# Patient Record
Sex: Female | Born: 1955
Health system: Southern US, Community
[De-identification: ages and names within clinical notes are randomized; demographics above are authoritative.]

## PROBLEM LIST (undated history)

## (undated) DIAGNOSIS — D649 Anemia, unspecified: Secondary | ICD-10-CM

## (undated) DIAGNOSIS — E119 Type 2 diabetes mellitus without complications: Secondary | ICD-10-CM

## (undated) DIAGNOSIS — E669 Obesity, unspecified: Secondary | ICD-10-CM

## (undated) DIAGNOSIS — Z5189 Encounter for other specified aftercare: Secondary | ICD-10-CM

## (undated) DIAGNOSIS — H269 Unspecified cataract: Secondary | ICD-10-CM

## (undated) DIAGNOSIS — K580 Irritable bowel syndrome with diarrhea: Secondary | ICD-10-CM

## (undated) DIAGNOSIS — M1990 Unspecified osteoarthritis, unspecified site: Secondary | ICD-10-CM

## (undated) DIAGNOSIS — I10 Essential (primary) hypertension: Secondary | ICD-10-CM

## (undated) DIAGNOSIS — T7840XA Allergy, unspecified, initial encounter: Secondary | ICD-10-CM

## (undated) DIAGNOSIS — E039 Hypothyroidism, unspecified: Secondary | ICD-10-CM

## (undated) DIAGNOSIS — G510 Bell's palsy: Secondary | ICD-10-CM

## (undated) DIAGNOSIS — E785 Hyperlipidemia, unspecified: Secondary | ICD-10-CM

## (undated) HISTORY — DX: Allergy, unspecified, initial encounter: T78.40XA

## (undated) HISTORY — DX: Essential (primary) hypertension: I10

## (undated) HISTORY — DX: Encounter for other specified aftercare: Z51.89

## (undated) HISTORY — DX: Type 2 diabetes mellitus without complications: E11.9

## (undated) HISTORY — DX: Obesity, unspecified: E66.9

## (undated) HISTORY — DX: Anemia, unspecified: D64.9

## (undated) HISTORY — DX: Hyperlipidemia, unspecified: E78.5

## (undated) HISTORY — DX: Hypothyroidism, unspecified: E03.9

## (undated) HISTORY — PX: FRACTURE SURGERY: SHX138

## (undated) HISTORY — PX: KNEE ARTHROSCOPY: SUR90

## (undated) HISTORY — DX: Irritable bowel syndrome with diarrhea: K58.0

## (undated) HISTORY — DX: Unspecified cataract: H26.9

## (undated) HISTORY — PX: EYE SURGERY: SHX253

---

## 2005-08-30 ENCOUNTER — Ambulatory Visit (HOSPITAL_COMMUNITY): Payer: Self-pay | Admitting: Pulmonary Disease

## 2005-08-30 ENCOUNTER — Encounter (HOSPITAL_COMMUNITY): Admission: RE | Admit: 2005-08-30 | Discharge: 2005-09-03 | Payer: Self-pay | Admitting: Pulmonary Disease

## 2009-06-18 ENCOUNTER — Ambulatory Visit (HOSPITAL_COMMUNITY): Admission: RE | Admit: 2009-06-18 | Discharge: 2009-06-18 | Payer: Self-pay | Admitting: Pulmonary Disease

## 2010-11-22 ENCOUNTER — Ambulatory Visit (HOSPITAL_COMMUNITY)
Admission: RE | Admit: 2010-11-22 | Discharge: 2010-11-22 | Payer: Self-pay | Source: Home / Self Care | Attending: Pulmonary Disease | Admitting: Pulmonary Disease

## 2012-01-20 ENCOUNTER — Ambulatory Visit (HOSPITAL_COMMUNITY)
Admission: RE | Admit: 2012-01-20 | Discharge: 2012-01-20 | Disposition: A | Discharge status: Home / Self Care | Source: Ambulatory Visit | Attending: Pulmonary Disease | Admitting: Pulmonary Disease

## 2012-01-20 ENCOUNTER — Other Ambulatory Visit (HOSPITAL_COMMUNITY): Payer: Self-pay | Admitting: Pulmonary Disease

## 2012-01-20 DIAGNOSIS — M79606 Pain in leg, unspecified: Secondary | ICD-10-CM

## 2012-01-20 DIAGNOSIS — M79609 Pain in unspecified limb: Secondary | ICD-10-CM | POA: Insufficient documentation

## 2012-01-20 DIAGNOSIS — M7989 Other specified soft tissue disorders: Secondary | ICD-10-CM | POA: Insufficient documentation

## 2012-01-20 DIAGNOSIS — M25569 Pain in unspecified knee: Secondary | ICD-10-CM

## 2012-03-25 ENCOUNTER — Encounter (HOSPITAL_COMMUNITY): Payer: Self-pay

## 2012-03-25 ENCOUNTER — Emergency Department (HOSPITAL_COMMUNITY)

## 2012-03-25 ENCOUNTER — Emergency Department (HOSPITAL_COMMUNITY)
Admission: EM | Admit: 2012-03-25 | Discharge: 2012-03-25 | Disposition: A | Discharge status: Home / Self Care | Attending: Emergency Medicine | Admitting: Emergency Medicine

## 2012-03-25 DIAGNOSIS — R262 Difficulty in walking, not elsewhere classified: Secondary | ICD-10-CM | POA: Insufficient documentation

## 2012-03-25 DIAGNOSIS — M25469 Effusion, unspecified knee: Secondary | ICD-10-CM | POA: Insufficient documentation

## 2012-03-25 DIAGNOSIS — X58XXXA Exposure to other specified factors, initial encounter: Secondary | ICD-10-CM | POA: Insufficient documentation

## 2012-03-25 DIAGNOSIS — M25569 Pain in unspecified knee: Secondary | ICD-10-CM | POA: Insufficient documentation

## 2012-03-25 DIAGNOSIS — S8990XA Unspecified injury of unspecified lower leg, initial encounter: Secondary | ICD-10-CM | POA: Insufficient documentation

## 2012-03-25 HISTORY — DX: Anemia, unspecified: D64.9

## 2012-03-25 HISTORY — DX: Bell's palsy: G51.0

## 2012-03-25 MED ORDER — OXYCODONE-ACETAMINOPHEN 5-325 MG PO TABS
1.0000 | ORAL_TABLET | Freq: Once | ORAL | Status: AC
  Administered 2012-03-25: 1 via ORAL
  Filled 2012-03-25: qty 1

## 2012-03-25 MED ORDER — OXYCODONE-ACETAMINOPHEN 5-325 MG PO TABS: 1.0000 | ORAL_TABLET | ORAL | Status: AC | PRN

## 2012-03-25 NOTE — ED Provider Notes (Signed)
History     CSN: [id number]  Arrival date & time 03/25/12  1210   First MD Initiated Contact with Patient 03/25/12 1419      Chief Complaint  Patient presents with  . Knee Pain    HPI Barbara Bennett is a 56 y.o. female who presents to the ED for left knee pain. She states that the pain started about a month ago. She has been followed by Dr. Juanetta Gosling and has had x-ray and ultrasound. He gave her a diagnosis of arthritis and she is taking NSAID's. Today she was singing at church and moving around a lot and felt a pop in her left knee. The pain was severe and she was unable to bear weight after it happened. Her family brought her to the ED. The history was provided by the patient.  Past Medical History  Diagnosis Date  . Anemia   . Bell's palsy     History reviewed. No pertinent past surgical history.  No family history on file.  History  Substance Use Topics  . Smoking status: Never Smoker   . Smokeless tobacco: Not on file  . Alcohol Use: No    OB History    Grav Para Term Preterm Abortions TAB SAB Ect Mult Living                  Review of Systems  Constitutional: Negative for fever and chills.  HENT: Negative for neck pain.   Eyes: Negative.   Cardiovascular: Negative for palpitations.  Gastrointestinal: Negative for nausea, vomiting and abdominal pain.  Musculoskeletal: Negative for back pain.       Left knee pain  Skin: Negative for rash and wound.  Neurological: Negative for syncope and headaches.  Psychiatric/Behavioral: Negative for confusion and agitation.    Allergies  Sulfa antibiotics  Home Medications  No current outpatient prescriptions on file.  BP 139/79  Pulse 89  Temp(Src) 97.9 F (36.6 C) (Oral)  Resp 20  Ht 5' (1.524 m)  Wt 170 lb (77.111 kg)  BMI 33.20 kg/m2  SpO2 100%  LMP 02/22/2012  Physical Exam  Nursing note and vitals reviewed. Constitutional: She is oriented to person, place, and time. She appears well-developed and  well-nourished. No distress.  HENT:  Head: Normocephalic.  Neck: Neck supple.  Cardiovascular: Normal rate.   Pulmonary/Chest: Effort normal.  Musculoskeletal: She exhibits tenderness.       Left knee: She exhibits swelling. tenderness found.       Difficulty with ambulation due to pain. Right knee with minimal edema. Pain with passive range of motion.   Neurological: She is alert and oriented to person, place, and time.       Pedal pulses present and equal. Adequate circulation. Good touch sensation.  Skin: Skin is warm and dry.  Psychiatric: She has a normal mood and affect. Her behavior is normal. Judgment and thought content normal.    ED Course  Procedures  Labs Reviewed - No data to display Dg Knee Complete 4 Views Left  03/25/2012  *RADIOLOGY REPORT*  Clinical Data: Limited range of motion. Pivot injury at church today.  History of injury to the knee.  LEFT KNEE - COMPLETE 4+ VIEW  Comparison: 01/20/2012  Findings: There is no evidence for acute fracture or dislocation. No soft tissue foreign body or gas identified.  No joint effusion.  IMPRESSION: Negative exam.  Original Report Authenticated By: Patterson Hammersmith, M.D.    Assessment: Knee pain/injury  Plan:  Knee  immobilizer   Crutches   Percoct Rx   Follow up tomorrow as planned with Dr. Juanetta Gosling   Return here as needed  MDM  I have reviewed this patient's vital signs, nurses notes, appropriate labs and imaging.         [facility] Orlene Och, [place] 03/25/12 1513

## 2012-03-25 NOTE — ED Notes (Signed)
Felt left knee pop today, having knee pain for at least 1 month, hurts to bear weight on leg at this time

## 2012-03-25 NOTE — ED Notes (Signed)
Pt presents with left knee pain and swelling after "having fun in church and twisted wrong feeling a pop in the left knee". Pt is sitting in wheelchair talking to family. No acute distress noted at this time.

## 2012-03-26 ENCOUNTER — Other Ambulatory Visit (HOSPITAL_COMMUNITY): Payer: Self-pay | Admitting: Pulmonary Disease

## 2012-03-26 ENCOUNTER — Ambulatory Visit (HOSPITAL_COMMUNITY)
Admission: RE | Admit: 2012-03-26 | Discharge: 2012-03-26 | Disposition: A | Discharge status: Home / Self Care | Source: Ambulatory Visit | Attending: Pulmonary Disease | Admitting: Pulmonary Disease

## 2012-03-26 DIAGNOSIS — R609 Edema, unspecified: Secondary | ICD-10-CM

## 2012-03-26 DIAGNOSIS — IMO0002 Reserved for concepts with insufficient information to code with codable children: Secondary | ICD-10-CM | POA: Insufficient documentation

## 2012-03-26 DIAGNOSIS — M1710 Unilateral primary osteoarthritis, unspecified knee: Secondary | ICD-10-CM | POA: Insufficient documentation

## 2012-03-26 DIAGNOSIS — M23329 Other meniscus derangements, posterior horn of medial meniscus, unspecified knee: Secondary | ICD-10-CM | POA: Insufficient documentation

## 2012-03-26 DIAGNOSIS — M25569 Pain in unspecified knee: Secondary | ICD-10-CM | POA: Insufficient documentation

## 2012-03-26 DIAGNOSIS — M25469 Effusion, unspecified knee: Secondary | ICD-10-CM | POA: Insufficient documentation

## 2012-03-26 DIAGNOSIS — M25562 Pain in left knee: Secondary | ICD-10-CM

## 2012-03-26 NOTE — ED Provider Notes (Signed)
Medical screening examination/treatment/procedure(s) were performed by non-physician practitioner and as supervising physician I was immediately available for consultation/collaboration.  Donabelle Molden S. Saniyah Mondesir, MD 03/26/12 2021 

## 2012-04-02 ENCOUNTER — Other Ambulatory Visit: Payer: Self-pay | Admitting: Orthopedic Surgery

## 2012-04-06 ENCOUNTER — Ambulatory Visit (HOSPITAL_BASED_OUTPATIENT_CLINIC_OR_DEPARTMENT_OTHER)
Admission: RE | Admit: 2012-04-06 | Discharge: 2012-04-06 | Disposition: A | Discharge status: Home / Self Care | Source: Ambulatory Visit | Attending: Orthopedic Surgery | Admitting: Orthopedic Surgery

## 2012-04-06 ENCOUNTER — Encounter (HOSPITAL_BASED_OUTPATIENT_CLINIC_OR_DEPARTMENT_OTHER): Payer: Self-pay | Admitting: *Deleted

## 2012-04-06 ENCOUNTER — Ambulatory Visit (HOSPITAL_BASED_OUTPATIENT_CLINIC_OR_DEPARTMENT_OTHER): Admitting: Anesthesiology

## 2012-04-06 ENCOUNTER — Encounter (HOSPITAL_BASED_OUTPATIENT_CLINIC_OR_DEPARTMENT_OTHER): Payer: Self-pay | Admitting: Anesthesiology

## 2012-04-06 ENCOUNTER — Encounter (HOSPITAL_BASED_OUTPATIENT_CLINIC_OR_DEPARTMENT_OTHER)
Admission: RE | Disposition: A | Discharge status: Home / Self Care | Payer: Self-pay | Source: Ambulatory Visit | Attending: Orthopedic Surgery

## 2012-04-06 DIAGNOSIS — S83249A Other tear of medial meniscus, current injury, unspecified knee, initial encounter: Secondary | ICD-10-CM

## 2012-04-06 DIAGNOSIS — M23329 Other meniscus derangements, posterior horn of medial meniscus, unspecified knee: Secondary | ICD-10-CM | POA: Insufficient documentation

## 2012-04-06 SURGERY — ARTHROSCOPY, KNEE, WITH MEDIAL MENISCECTOMY
Anesthesia: General | Site: Knee | Laterality: Left | Wound class: Clean

## 2012-04-06 MED ORDER — LACTATED RINGERS IV SOLN
INTRAVENOUS | Status: DC
  Administered 2012-04-06 (×3): via INTRAVENOUS

## 2012-04-06 MED ORDER — BUPIVACAINE HCL (PF) 0.5 % IJ SOLN
INTRAMUSCULAR | Status: DC | PRN
  Administered 2012-04-06: 20 mL

## 2012-04-06 MED ORDER — DEXAMETHASONE SODIUM PHOSPHATE 4 MG/ML IJ SOLN
INTRAMUSCULAR | Status: DC | PRN
  Administered 2012-04-06: 10 mg via INTRAVENOUS

## 2012-04-06 MED ORDER — OXYCODONE-ACETAMINOPHEN 5-325 MG PO TABS
1.0000 | ORAL_TABLET | ORAL | Status: DC | PRN
  Administered 2012-04-06: 1 via ORAL

## 2012-04-06 MED ORDER — PROMETHAZINE HCL 25 MG PO TABS: 25.0000 mg | ORAL_TABLET | Freq: Four times a day (QID) | ORAL | Status: DC | PRN

## 2012-04-06 MED ORDER — OXYCODONE-ACETAMINOPHEN 10-325 MG PO TABS: 1.0000 | ORAL_TABLET | Freq: Four times a day (QID) | ORAL | Status: AC | PRN

## 2012-04-06 MED ORDER — METHOCARBAMOL 500 MG PO TABS: 500.0000 mg | ORAL_TABLET | Freq: Four times a day (QID) | ORAL | Status: AC

## 2012-04-06 MED ORDER — FENTANYL CITRATE 0.05 MG/ML IJ SOLN
INTRAMUSCULAR | Status: DC | PRN
  Administered 2012-04-06: 100 ug via INTRAVENOUS
  Administered 2012-04-06 (×2): 50 ug via INTRAVENOUS

## 2012-04-06 MED ORDER — MIDAZOLAM HCL 5 MG/5ML IJ SOLN
INTRAMUSCULAR | Status: DC | PRN
  Administered 2012-04-06: 1 mg via INTRAVENOUS

## 2012-04-06 MED ORDER — PROPOFOL 10 MG/ML IV EMUL
INTRAVENOUS | Status: DC | PRN
  Administered 2012-04-06: 200 mg via INTRAVENOUS

## 2012-04-06 MED ORDER — HYDROMORPHONE HCL PF 1 MG/ML IJ SOLN
0.2500 mg | INTRAMUSCULAR | Status: DC | PRN
  Administered 2012-04-06 (×2): 0.25 mg via INTRAVENOUS

## 2012-04-06 MED ORDER — SODIUM CHLORIDE 0.9 % IR SOLN
Status: DC | PRN
  Administered 2012-04-06: 9000 mL

## 2012-04-06 MED ORDER — CEFAZOLIN SODIUM 1-5 GM-% IV SOLN
1.0000 g | INTRAVENOUS | Status: AC
  Administered 2012-04-06: 1 g via INTRAVENOUS

## 2012-04-06 SURGICAL SUPPLY — 47 items
BANDAGE ELASTIC 6 VELCRO ST LF (GAUZE/BANDAGES/DRESSINGS) ×2 IMPLANT
BANDAGE ESMARK 6X9 LF (GAUZE/BANDAGES/DRESSINGS) IMPLANT
BENZOIN TINCTURE PRP APPL 2/3 (GAUZE/BANDAGES/DRESSINGS) ×2 IMPLANT
BLADE CUDA 5.5 (BLADE) IMPLANT
BLADE CUDA GRT WHITE 3.5 (BLADE) IMPLANT
BLADE CUDA SHAVER 3.5 (BLADE) IMPLANT
BLADE CUTTER GATOR 3.5 (BLADE) ×2 IMPLANT
BLADE CUTTER MENIS 5.5 (BLADE) IMPLANT
BLADE GREAT WHITE 4.2 (BLADE) IMPLANT
BNDG ESMARK 6X9 LF (GAUZE/BANDAGES/DRESSINGS)
CANISTER OMNI JUG 16 LITER (MISCELLANEOUS) ×2 IMPLANT
CANISTER SUCTION 2500CC (MISCELLANEOUS) IMPLANT
CLOTH BEACON ORANGE TIMEOUT ST (SAFETY) ×2 IMPLANT
CUFF TOURNIQUET SINGLE 34IN LL (TOURNIQUET CUFF) IMPLANT
CUTTER KNOT PUSHER 2-0 FIBERWI (INSTRUMENTS) IMPLANT
CUTTER MENISCUS  4.2MM (BLADE)
CUTTER MENISCUS 4.2MM (BLADE) IMPLANT
DRAPE ARTHROSCOPY W/POUCH 90 (DRAPES) ×2 IMPLANT
DURAPREP 26ML APPLICATOR (WOUND CARE) ×2 IMPLANT
ELECT MENISCUS 165MM 90D (ELECTRODE) IMPLANT
ELECT REM PT RETURN 9FT ADLT (ELECTROSURGICAL)
ELECTRODE REM PT RTRN 9FT ADLT (ELECTROSURGICAL) IMPLANT
GLOVE BIO SURGEON STRL SZ8 (GLOVE) ×2 IMPLANT
GLOVE BIOGEL M STRL SZ7.5 (GLOVE) ×2 IMPLANT
GLOVE BIOGEL PI IND STRL 8 (GLOVE) ×3 IMPLANT
GLOVE BIOGEL PI INDICATOR 8 (GLOVE) ×3
GLOVE ORTHO TXT STRL SZ7.5 (GLOVE) ×2 IMPLANT
GOWN PREVENTION PLUS XLARGE (GOWN DISPOSABLE) ×2 IMPLANT
GOWN PREVENTION PLUS XXLARGE (GOWN DISPOSABLE) ×4 IMPLANT
HOLDER KNEE FOAM BLUE (MISCELLANEOUS) ×2 IMPLANT
KNEE WRAP E Z 3 GEL PACK (MISCELLANEOUS) ×2 IMPLANT
NEEDLE MENISCAL REPAIR DBL ARM (NEEDLE) IMPLANT
NEEDLE MENISCAL REPAIR W/EYELT (NEEDLE) IMPLANT
PACK ARTHROSCOPY DSU (CUSTOM PROCEDURE TRAY) ×2 IMPLANT
PACK BASIN DAY SURGERY FS (CUSTOM PROCEDURE TRAY) ×2 IMPLANT
PENCIL BUTTON HOLSTER BLD 10FT (ELECTRODE) IMPLANT
SET ARTHROSCOPY TUBING (MISCELLANEOUS)
SET ARTHROSCOPY TUBING LN (MISCELLANEOUS) IMPLANT
SET IRRIG Y TYPE TUR BLADDER L (SET/KITS/TRAYS/PACK) ×2 IMPLANT
SLEEVE SCD COMPRESS KNEE MED (MISCELLANEOUS) ×2 IMPLANT
SPONGE GAUZE 4X4 12PLY (GAUZE/BANDAGES/DRESSINGS) ×2 IMPLANT
STRIP CLOSURE SKIN 1/2X4 (GAUZE/BANDAGES/DRESSINGS) ×2 IMPLANT
SUT MNCRL AB 4-0 PS2 18 (SUTURE) ×2 IMPLANT
TOWEL OR 17X24 6PK STRL BLUE (TOWEL DISPOSABLE) ×2 IMPLANT
TOWEL OR NON WOVEN STRL DISP B (DISPOSABLE) ×2 IMPLANT
WAND STAR VAC 90 (SURGICAL WAND) ×2 IMPLANT
WATER STERILE IRR 1000ML POUR (IV SOLUTION) ×2 IMPLANT

## 2012-04-06 NOTE — Op Note (Signed)
04/06/2012  6:27 PM  PATIENT:  Barbara Bennett    PRE-OPERATIVE DIAGNOSIS:  Left knee medial meniscus tear  POST-OPERATIVE DIAGNOSIS:  Same, with grade 4 chondral defect on the medial tibial condyle  PROCEDURE:  KNEE ARTHROSCOPY WITH MEDIAL MENISECTOMY  SURGEON:  Eulas Post, MD  PHYSICIAN ASSISTANT: Janace Litten, OPA-C, present and scrubbed throughout the case, critical for completion in a timely fashion, and for retraction, instrumentation, and closure.  ANESTHESIA:   General  PREOPERATIVE INDICATIONS:  Barbara Bennett is a  56 y.o. female with a diagnosis of Left knee medial meniscus tear who failed conservative measures and elected for surgical management.    The risks benefits and alternatives were discussed with the patient preoperatively including but not limited to the risks of infection, bleeding, nerve injury, cardiopulmonary complications, the need for revision surgery, among others, and the patient was willing to proceed. We also discussed the risks of incomplete relief of symptoms, and the need for potential arthroplasty.  OPERATIVE IMPLANTS: None  OPERATIVE FINDINGS: The patellofemoral joint had a little bit of chondromalacia, but was fairly well-maintained. The lateral compartment was normal. The anterior cruciate ligament and PCL were intact. The medial and lateral gutters were normal. The medial compartment had a radial tear of the posterior horn of the medial meniscus. There was also a grade 4 0.75 x 0.75 cm chondral lesion, on the tibial plateau. There was diffuse grade 2 changes on the femur.  OPERATIVE PROCEDURE: The patient was brought to the operating room and placed in the supine position. Time out was performed. Diagnostic arthroscopy was carried out after a sterile prep and drape. The above-named findings were noted. The arthroscopic shaver and the arthroscopic liner was used to debride the medial meniscus back to a stable rim. The chondral fragments off of  the tibial condyle were also debrided with the shaver. I saucerize the posterior horn of the medial meniscus, back to a stable configuration. The diagnostic arthroscopy was completed, and the knee was irrigated copiously, and the portals closed with Monocryl followed by Steri-Strips dural gauze. She tolerated the procedure well and there were no complications.

## 2012-04-06 NOTE — Transfer of Care (Signed)
Immediate Anesthesia Transfer of Care Note  Patient: Barbara Bennett  Procedure(s) Performed: Procedure(s) (LRB): KNEE ARTHROSCOPY WITH MEDIAL MENISECTOMY (Left)  Patient Location: PACU  Anesthesia Type: General  Level of Consciousness: sedated  Airway & Oxygen Therapy: Patient Spontanous Breathing and Patient connected to face mask oxygen  Post-op Assessment: Report given to PACU RN and Post -op Vital signs reviewed and stable  Post vital signs: Reviewed and stable  Complications: No apparent anesthesia complications

## 2012-04-06 NOTE — H&P (Signed)
  PREOPERATIVE H&P  Chief Complaint: left knee medial meniscus tear  HPI: Barbara Bennett is a 56 y.o. female who presents for preoperative history and physical with a diagnosis of left knee medial meniscus tear. Symptoms are rated as moderate to severe, and have been worsening.  This is significantly impairing activities of daily living.  She has elected for surgical management.   Past Medical History  Diagnosis Date  . Anemia   . Bell's palsy    No past surgical history on file. History   Social History  . Marital Status: Married    Spouse Name: N/A    Number of Children: N/A  . Years of Education: N/A   Social History Main Topics  . Smoking status: Never Smoker   . Smokeless tobacco: Not on file  . Alcohol Use: No  . Drug Use: No  . Sexually Active: No   Other Topics Concern  . Not on file   Social History Narrative  . No narrative on file   No family history on file. Allergies  Allergen Reactions  . Sulfa Antibiotics Rash   Prior to Admission medications   Medication Sig Start Date End Date Taking? Authorizing Provider  norethindrone-ethinyl estradiol (MICROGESTIN,JUNEL,LOESTRIN) 1-20 MG-MCG tablet Take 1 tablet by mouth daily.   Yes Historical Provider, MD     Positive ROS: All other systems have been reviewed and were otherwise negative with the exception of those mentioned in the HPI and as above.  Physical Exam: General: Alert, no acute distress Cardiovascular: No pedal edema Respiratory: No cyanosis, no use of accessory musculature GI: No organomegaly, abdomen is soft and non-tender Skin: No lesions in the area of chief complaint Neurologic: Sensation intact distally Psychiatric: Patient is competent for consent with normal mood and affect Lymphatic: No axillary or cervical lymphadenopathy  MUSCULOSKELETAL: Left knee with positive medial joint line tenderness. Mild effusion. Normal stability.  Assessment: left knee mmt  Plan: Plan for  Procedure(s): KNEE ARTHROSCOPY WITH MEDIAL MENISECTOMY  The risks benefits and alternatives were discussed with the patient including but not limited to the risks of nonoperative treatment, versus surgical intervention including infection, bleeding, nerve injury,  blood clots, cardiopulmonary complications, morbidity, mortality, among others, and they were willing to proceed.   Eulas Post, MD 04/06/2012 7:26 AM

## 2012-04-06 NOTE — Discharge Instructions (Signed)
Arthroscopic Procedure, Knee An arthroscopic procedure can find what is wrong with your knee. PROCEDURE Arthroscopy is a surgical technique that allows your orthopedic surgeon to diagnose and treat your knee injury with accuracy. They will look into your knee through a small instrument. This is almost like a small (pencil sized) telescope. Because arthroscopy affects your knee less than open knee surgery, you can anticipate a more rapid recovery. Taking an active role by following your caregiver's instructions will help with rapid and complete recovery. Use crutches, rest, elevation, ice, and knee exercises as instructed. The length of recovery depends on various factors including type of injury, age, physical condition, medical conditions, and your rehabilitation. Your knee is the joint between the large bones (femur and tibia) in your leg. Cartilage covers these bone ends which are smooth and slippery and allow your knee to bend and move smoothly. Two menisci, thick, semi-lunar shaped pads of cartilage which form a rim inside the joint, help absorb shock and stabilize your knee. Ligaments bind the bones together and support your knee joint. Muscles move the joint, help support your knee, and take stress off the joint itself. Because of this all programs and physical therapy to rehabilitate an injured or repaired knee require rebuilding and strengthening your muscles. AFTER THE PROCEDURE  After the procedure, you will be moved to a recovery area until most of the effects of the medication have worn off. Your caregiver will discuss the test results with you.   Only take over-the-counter or prescription medicines for pain, discomfort, or fever as directed by your caregiver.  SEEK MEDICAL CARE IF:   You have increased bleeding from your wounds.   You see redness, swelling, or have increasing pain in your wounds.   You have pus coming from your wound.   You have an oral temperature above 102 F (38.9  C).   You notice a bad smell coming from the wound or dressing.   You have severe pain with any motion of your knee.  SEEK IMMEDIATE MEDICAL CARE IF:   You develop a rash.   You have difficulty breathing.   You have any allergic problems.  Document Released: 11/18/2000 Document Revised: 11/10/2011 Document Reviewed: 06/11/2008 ExitCare Patient Information 2012 [place], [facility] Post Anesthesia Home Care Instructions  Activity: Get plenty of rest for the remainder of the day. A responsible adult should stay with you for 24 hours following the procedure.  For the next 24 hours, DO NOT: -Drive a car -Advertising copywriter -Drink alcoholic beverages -Take any medication unless instructed by your physician -Make any legal decisions or sign important papers.  Meals: Start with liquid foods such as gelatin or soup. Progress to regular foods as tolerated. Avoid greasy, spicy, heavy foods. If nausea and/or vomiting occur, drink only clear liquids until the nausea and/or vomiting subsides. Call your physician if vomiting continues.  Special Instructions/Symptoms: Your throat may feel dry or sore from the anesthesia or the breathing tube placed in your throat during surgery. If this causes discomfort, gargle with warm salt water. The discomfort should disappear within 24 hours.  [name]

## 2012-04-06 NOTE — Anesthesia Postprocedure Evaluation (Signed)
  Anesthesia Post-op Note  Patient: Barbara Bennett  Procedure(s) Performed: Procedure(s) (LRB): KNEE ARTHROSCOPY WITH MEDIAL MENISECTOMY (Left)  Patient Location: PACU  Anesthesia Type: General  Level of Consciousness: awake, alert  and oriented  Airway and Oxygen Therapy: Patient Spontanous Breathing  Post-op Pain: mild  Post-op Assessment: Post-op Vital signs reviewed, Patient's Cardiovascular Status Stable, Respiratory Function Stable, Patent Airway, No signs of Nausea or vomiting and Pain level controlled  Post-op Vital Signs: Reviewed and stable  Complications: No apparent anesthesia complications

## 2012-04-06 NOTE — Anesthesia Preprocedure Evaluation (Signed)
Anesthesia Evaluation  Patient identified by MRN, date of birth, ID band Patient awake    Reviewed: Allergy & Precautions, H&P , NPO status , Patient's Chart, lab work & pertinent test results  History of Anesthesia Complications Negative for: history of anesthetic complications  Airway Mallampati: I TM Distance: >3 FB Neck ROM: Full    Dental  (+) Missing and Dental Advisory Given   Pulmonary neg pulmonary ROS,  breath sounds clear to auscultation  Pulmonary exam normal       Cardiovascular negative cardio ROS  Rhythm:Regular Rate:Normal     Neuro/Psych negative neurological ROS     GI/Hepatic negative GI ROS, Neg liver ROS,   Endo/Other  Morbid obesity  Renal/GU negative Renal ROS     Musculoskeletal   Abdominal (+) + obese,   Peds  Hematology Sickle cell trait, has required transfusion in past   Anesthesia Other Findings   Reproductive/Obstetrics                           Anesthesia Physical Anesthesia Plan  ASA: II  Anesthesia Plan: General   Post-op Pain Management:    Induction: Intravenous  Airway Management Planned: LMA  Additional Equipment:   Intra-op Plan:   Post-operative Plan:   Informed Consent: I have reviewed the patients History and Physical, chart, labs and discussed the procedure including the risks, benefits and alternatives for the proposed anesthesia with the patient or authorized representative who has indicated his/her understanding and acceptance.   Dental advisory given  Plan Discussed with: CRNA and Surgeon  Anesthesia Plan Comments: (Plan routine monitors, GA- LMA ok)        Anesthesia Quick Evaluation

## 2013-04-15 ENCOUNTER — Other Ambulatory Visit (HOSPITAL_COMMUNITY): Payer: Self-pay | Admitting: Pulmonary Disease

## 2013-04-15 ENCOUNTER — Ambulatory Visit (HOSPITAL_COMMUNITY)
Admission: RE | Admit: 2013-04-15 | Discharge: 2013-04-15 | Disposition: A | Discharge status: Home / Self Care | Source: Ambulatory Visit | Attending: Pulmonary Disease | Admitting: Pulmonary Disease

## 2013-04-15 DIAGNOSIS — M7989 Other specified soft tissue disorders: Secondary | ICD-10-CM | POA: Insufficient documentation

## 2013-04-15 DIAGNOSIS — R609 Edema, unspecified: Secondary | ICD-10-CM

## 2013-09-02 ENCOUNTER — Ambulatory Visit (HOSPITAL_COMMUNITY)
Admission: RE | Admit: 2013-09-02 | Discharge: 2013-09-02 | Disposition: A | Discharge status: Home / Self Care | Source: Ambulatory Visit | Attending: Pulmonary Disease | Admitting: Pulmonary Disease

## 2013-09-02 ENCOUNTER — Other Ambulatory Visit (HOSPITAL_COMMUNITY): Payer: Self-pay | Admitting: Pulmonary Disease

## 2013-09-02 DIAGNOSIS — M25562 Pain in left knee: Secondary | ICD-10-CM

## 2013-09-02 DIAGNOSIS — M25569 Pain in unspecified knee: Secondary | ICD-10-CM | POA: Insufficient documentation

## 2016-03-24 NOTE — Patient Instructions (Signed)
Barbara GoldsBeverly M Yankee  03/24/2016     @PREFPERIOPPHARMACY @   Your procedure is scheduled on 03/31/16  [facility]  Call this number if you have problems the morning of surgery:  [phone]   Remember:  Do not eat food or drink liquids after midnight.  Take these medicines the morning of surgery with A SIP OF WATER none   Do not wear jewelry, make-up or nail polish.  Do not wear lotions, powders, or perfumes.  You may wear deodorant.  Do not shave 48 hours prior to surgery.  Men may shave face and neck.  Do not bring valuables to the hospital.  [facility] any belongings or valuables.  Contacts, dentures or bridgework may not be worn into surgery.  Leave your suitcase in the car.  After surgery it may be brought to your room.  For patients admitted to the hospital, discharge time will be determined by your treatment team.  Patients discharged the day of surgery will not be allowed to drive home.   Name and phone number of your driver:   family Special instructions:  none  Please read over the following fact sheets that you were given. Anesthesia Post-op Instructions and Care and Recovery After Surgery      PATIENT INSTRUCTIONS POST-ANESTHESIA  IMMEDIATELY FOLLOWING SURGERY:  Do not drive or operate machinery for the first twenty four hours after surgery.  Do not make any important decisions for twenty four hours after surgery or while taking narcotic pain medications or sedatives.  If you develop intractable nausea and vomiting or a severe headache please notify your doctor immediately.  FOLLOW-UP:  Please make an appointment with your surgeon as instructed. You do not need to follow up with anesthesia unless specifically instructed to do so.  WOUND CARE INSTRUCTIONS (if applicable):  Keep a dry clean dressing on the anesthesia/puncture wound site if there is drainage.  Once the wound has quit draining you may leave it open to air.   Generally you should leave the bandage intact for twenty four hours unless there is drainage.  If the epidural site drains for more than 36-48 hours please call the anesthesia department.  QUESTIONS?:  Please feel free to call your physician or the hospital operator if you have any questions, and they will be happy to assist you.      Cataract A cataract is a clouding of the lens of the eye. When a lens becomes cloudy, vision is reduced based on the degree and nature of the clouding. Many cataracts reduce vision to some degree. Some cataracts make people more near-sighted as they develop. Other cataracts increase glare. Cataracts that are ignored and become worse can sometimes look white. The white color can be seen through the pupil. CAUSES   Aging. However, cataracts may occur at any age, even in newborns.  Certain drugs.  Trauma to the eye.  Certain diseases such as diabetes.  Specific eye diseases such as chronic inflammation inside the eye or a sudden attack of a rare form of glaucoma.  Inherited or acquired medical problems. SYMPTOMS   Gradual, progressive drop in vision in the affected eye.  Severe, rapid visual loss. This most often happens when trauma is the cause. DIAGNOSIS  To detect a cataract, an eye doctor examines the lens. Cataracts are best diagnosed with an exam of the eyes with the pupils enlarged (dilated) by drops.  TREATMENT  For an early cataract,  vision may improve by using different eyeglasses or stronger lighting. If that does not help your vision, surgery is the only effective treatment. A cataract needs to be surgically removed when vision loss interferes with your everyday activities, such as driving, reading, or watching TV. A cataract may also have to be removed if it prevents examination or treatment of another eye problem. Surgery removes the cloudy lens and usually replaces it with a substitute lens (intraocular lens, IOL).  At a time when both you and  your doctor agree, the cataract will be surgically removed. If you have cataracts in both eyes, only one is usually removed at a time. This allows the operated eye to heal and be out of danger from any possible problems after surgery (such as infection or poor wound healing). In rare cases, a cataract may be doing damage to your eye. In these cases, your caregiver may advise surgical removal right away. The vast majority of people who have cataract surgery have better vision afterward. HOME CARE INSTRUCTIONS  If you are not planning surgery, you may be asked to do the following:  Use different eyeglasses.  Use stronger or brighter lighting.  Ask your eye doctor about reducing your medicine dose or changing medicines if it is thought that a medicine caused your cataract. Changing medicines does not make the cataract go away on its own.  Become familiar with your surroundings. Poor vision can lead to injury. Avoid bumping into things on the affected side. You are at a higher risk for tripping or falling.  Exercise extreme care when driving or operating machinery.  Wear sunglasses if you are sensitive to bright light or experiencing problems with glare. SEEK IMMEDIATE MEDICAL CARE IF:   You have a worsening or sudden vision loss.  You notice redness, swelling, or increasing pain in the eye.  You have a fever.   This information is not intended to replace advice given to you by your health care provider. Make sure you discuss any questions you have with your health care provider.   Document Released: 11/21/2005 Document Revised: 02/13/2012 Document Reviewed: 05/27/2015 Elsevier Interactive Patient Education [employer].

## 2016-03-25 ENCOUNTER — Encounter (HOSPITAL_COMMUNITY): Payer: Self-pay

## 2016-03-25 ENCOUNTER — Other Ambulatory Visit: Payer: Self-pay

## 2016-03-25 ENCOUNTER — Encounter (HOSPITAL_COMMUNITY)
Admission: RE | Admit: 2016-03-25 | Discharge: 2016-03-25 | Disposition: A | Discharge status: Home / Self Care | Source: Ambulatory Visit | Attending: Ophthalmology | Admitting: Ophthalmology

## 2016-03-25 DIAGNOSIS — Z01810 Encounter for preprocedural cardiovascular examination: Secondary | ICD-10-CM | POA: Diagnosis not present

## 2016-03-25 DIAGNOSIS — Z01812 Encounter for preprocedural laboratory examination: Secondary | ICD-10-CM | POA: Diagnosis present

## 2016-03-25 HISTORY — DX: Unspecified osteoarthritis, unspecified site: M19.90

## 2016-03-25 LAB — BASIC METABOLIC PANEL
Anion gap: 9 (ref 5–15)
BUN: 13 mg/dL (ref 6–20)
CALCIUM: 9.3 mg/dL (ref 8.9–10.3)
CO2: 25 mmol/L (ref 22–32)
CREATININE: 0.67 mg/dL (ref 0.44–1.00)
Chloride: 104 mmol/L (ref 101–111)
GFR calc Af Amer: >60 mL/min (ref >60)
Glucose, Bld: 182 mg/dL — ABNORMAL HIGH (ref 65–99)
POTASSIUM: 3.6 mmol/L (ref 3.5–5.1)
SODIUM: 138 mmol/L (ref 135–145)

## 2016-03-25 LAB — CBC
HCT: 36.6 % (ref 36.0–46.0)
Hemoglobin: 12.2 g/dL (ref 12.0–15.0)
MCH: 27.3 pg (ref 26.0–34.0)
MCHC: 33.3 g/dL (ref 30.0–36.0)
MCV: 81.9 fL (ref 78.0–100.0)
PLATELETS: 171 10*3/uL (ref 150–400)
RBC: 4.47 MIL/uL (ref 3.87–5.11)
RDW: 13.1 % (ref 11.5–15.5)
WBC: 6.2 10*3/uL (ref 4.0–10.5)

## 2016-03-31 ENCOUNTER — Encounter (HOSPITAL_COMMUNITY): Payer: Self-pay | Admitting: *Deleted

## 2016-03-31 ENCOUNTER — Ambulatory Visit (HOSPITAL_COMMUNITY)
Admission: RE | Admit: 2016-03-31 | Discharge: 2016-03-31 | Disposition: A | Discharge status: Home / Self Care | Source: Ambulatory Visit | Attending: Ophthalmology | Admitting: Ophthalmology

## 2016-03-31 ENCOUNTER — Encounter (HOSPITAL_COMMUNITY)
Admission: RE | Disposition: A | Discharge status: Home / Self Care | Payer: Self-pay | Source: Ambulatory Visit | Attending: Ophthalmology

## 2016-03-31 ENCOUNTER — Ambulatory Visit (HOSPITAL_COMMUNITY): Admitting: Anesthesiology

## 2016-03-31 DIAGNOSIS — H2512 Age-related nuclear cataract, left eye: Secondary | ICD-10-CM | POA: Diagnosis not present

## 2016-03-31 DIAGNOSIS — Z6837 Body mass index (BMI) 37.0-37.9, adult: Secondary | ICD-10-CM | POA: Insufficient documentation

## 2016-03-31 DIAGNOSIS — E119 Type 2 diabetes mellitus without complications: Secondary | ICD-10-CM | POA: Diagnosis not present

## 2016-03-31 DIAGNOSIS — D573 Sickle-cell trait: Secondary | ICD-10-CM | POA: Diagnosis not present

## 2016-03-31 DIAGNOSIS — M1991 Primary osteoarthritis, unspecified site: Secondary | ICD-10-CM | POA: Insufficient documentation

## 2016-03-31 DIAGNOSIS — R7303 Prediabetes: Secondary | ICD-10-CM | POA: Insufficient documentation

## 2016-03-31 HISTORY — PX: CATARACT EXTRACTION W/PHACO: SHX586

## 2016-03-31 SURGERY — PHACOEMULSIFICATION, CATARACT, WITH IOL INSERTION
Anesthesia: Monitor Anesthesia Care | Site: Eye | Laterality: Left

## 2016-03-31 MED ORDER — LACTATED RINGERS IV SOLN
INTRAVENOUS | Status: DC
  Administered 2016-03-31: 1000 mL via INTRAVENOUS

## 2016-03-31 MED ORDER — MIDAZOLAM HCL 2 MG/2ML IJ SOLN
INTRAMUSCULAR | Status: AC
  Filled 2016-03-31: qty 2

## 2016-03-31 MED ORDER — BSS IO SOLN
INTRAOCULAR | Status: DC | PRN
  Administered 2016-03-31: 500 mL

## 2016-03-31 MED ORDER — POVIDONE-IODINE 5 % OP SOLN
OPHTHALMIC | Status: DC | PRN
  Administered 2016-03-31: 1 via OPHTHALMIC

## 2016-03-31 MED ORDER — NEOMYCIN-POLYMYXIN-DEXAMETH 3.5-10000-0.1 OP SUSP
OPHTHALMIC | Status: DC | PRN
  Administered 2016-03-31: 2 [drp] via OPHTHALMIC

## 2016-03-31 MED ORDER — TETRACAINE HCL 0.5 % OP SOLN
1.0000 [drp] | OPHTHALMIC | Status: AC
  Administered 2016-03-31 (×3): 1 [drp] via OPHTHALMIC

## 2016-03-31 MED ORDER — FENTANYL CITRATE (PF) 100 MCG/2ML IJ SOLN
INTRAMUSCULAR | Status: AC
  Filled 2016-03-31: qty 2

## 2016-03-31 MED ORDER — CYCLOPENTOLATE-PHENYLEPHRINE 0.2-1 % OP SOLN
1.0000 [drp] | OPHTHALMIC | Status: AC
  Administered 2016-03-31 (×3): 1 [drp] via OPHTHALMIC

## 2016-03-31 MED ORDER — LIDOCAINE HCL (PF) 1 % IJ SOLN
INTRAOCULAR | Status: DC | PRN
  Administered 2016-03-31: .9 mL via OPHTHALMIC

## 2016-03-31 MED ORDER — FENTANYL CITRATE (PF) 100 MCG/2ML IJ SOLN
25.0000 ug | INTRAMUSCULAR | Status: AC
  Administered 2016-03-31 (×2): 25 ug via INTRAVENOUS

## 2016-03-31 MED ORDER — BSS IO SOLN
INTRAOCULAR | Status: DC | PRN
  Administered 2016-03-31: 15 mL via INTRAOCULAR

## 2016-03-31 MED ORDER — PHENYLEPHRINE HCL 2.5 % OP SOLN
1.0000 [drp] | OPHTHALMIC | Status: AC
  Administered 2016-03-31 (×3): 1 [drp] via OPHTHALMIC

## 2016-03-31 MED ORDER — EPINEPHRINE HCL 1 MG/ML IJ SOLN
INTRAMUSCULAR | Status: AC
  Filled 2016-03-31: qty 1

## 2016-03-31 MED ORDER — MIDAZOLAM HCL 2 MG/2ML IJ SOLN
1.0000 mg | INTRAMUSCULAR | Status: DC | PRN
  Administered 2016-03-31: 2 mg via INTRAVENOUS

## 2016-03-31 MED ORDER — PROVISC 10 MG/ML IO SOLN
INTRAOCULAR | Status: DC | PRN
  Administered 2016-03-31: 0.85 mL via INTRAOCULAR

## 2016-03-31 MED ORDER — LIDOCAINE HCL 3.5 % OP GEL
1.0000 "application " | Freq: Once | OPHTHALMIC | Status: AC
  Administered 2016-03-31: 1 via OPHTHALMIC

## 2016-03-31 SURGICAL SUPPLY — 23 items
CAPSULAR TENSION RING-AMO (OPHTHALMIC RELATED) IMPLANT
CLOTH BEACON ORANGE TIMEOUT ST (SAFETY) ×3 IMPLANT
EYE SHIELD UNIVERSAL CLEAR (GAUZE/BANDAGES/DRESSINGS) ×3 IMPLANT
GLOVE BIOGEL PI IND STRL 7.0 (GLOVE) ×1 IMPLANT
GLOVE BIOGEL PI IND STRL 7.5 (GLOVE) IMPLANT
GLOVE BIOGEL PI INDICATOR 7.0 (GLOVE) ×2
GLOVE BIOGEL PI INDICATOR 7.5 (GLOVE)
GLOVE EXAM NITRILE LRG STRL (GLOVE) IMPLANT
GLOVE EXAM NITRILE MD LF STRL (GLOVE) ×3 IMPLANT
KIT VITRECTOMY (OPHTHALMIC RELATED) IMPLANT
PAD ARMBOARD 7.5X6 YLW CONV (MISCELLANEOUS) ×3 IMPLANT
PROC W NO LENS (INTRAOCULAR LENS)
PROC W SPEC LENS (INTRAOCULAR LENS)
PROCESS W NO LENS (INTRAOCULAR LENS) IMPLANT
PROCESS W SPEC LENS (INTRAOCULAR LENS) IMPLANT
RETRACTOR IRIS SIGHTPATH (OPHTHALMIC RELATED) IMPLANT
RING MALYGIN (MISCELLANEOUS) IMPLANT
SIGHTPATH CAT PROC W REG LENS (Ophthalmic Related) ×3 IMPLANT
SYRINGE LUER LOK 1CC (MISCELLANEOUS) ×3 IMPLANT
TAPE SURG TRANSPORE 1 IN (GAUZE/BANDAGES/DRESSINGS) ×1 IMPLANT
TAPE SURGICAL TRANSPORE 1 IN (GAUZE/BANDAGES/DRESSINGS) ×2
VISCOELASTIC ADDITIONAL (OPHTHALMIC RELATED) IMPLANT
WATER STERILE IRR 250ML POUR (IV SOLUTION) ×3 IMPLANT

## 2016-03-31 NOTE — Anesthesia Preprocedure Evaluation (Signed)
Anesthesia Evaluation  Patient identified by MRN, date of birth, ID band Patient awake    Reviewed: Allergy & Precautions, H&P , NPO status , Patient's Chart, lab work & pertinent test results  History of Anesthesia Complications Negative for: history of anesthetic complications  Airway Mallampati: I  TM Distance: >3 FB Neck ROM: Full    Dental  (+) Poor Dentition   Pulmonary neg pulmonary ROS,    Pulmonary exam normal breath sounds clear to auscultation       Cardiovascular negative cardio ROS Normal cardiovascular exam Rhythm:Regular Rate:Normal     Neuro/Psych negative neurological ROS     GI/Hepatic negative GI ROS, Neg liver ROS,   Endo/Other  diabetes (pre DM)Morbid obesity  Renal/GU negative Renal ROS     Musculoskeletal  (+) Arthritis ,   Abdominal (+) + obese,   Peds  Hematology  (+) anemia , Sickle cell trait, has required transfusion in past   Anesthesia Other Findings   Reproductive/Obstetrics                             Anesthesia Physical Anesthesia Plan  ASA: II  Anesthesia Plan: MAC   Post-op Pain Management:    Induction: Intravenous  Airway Management Planned: Nasal Cannula  Additional Equipment:   Intra-op Plan:   Post-operative Plan:   Informed Consent: I have reviewed the patients History and Physical, chart, labs and discussed the procedure including the risks, benefits and alternatives for the proposed anesthesia with the patient or authorized representative who has indicated his/her understanding and acceptance.     Plan Discussed with:   Anesthesia Plan Comments:         Anesthesia Quick Evaluation

## 2016-03-31 NOTE — Anesthesia Postprocedure Evaluation (Signed)
Anesthesia Post Note  Patient: Barbara Bennett  Procedure(s) Performed: Procedure(s) (LRB): CATARACT EXTRACTION PHACO AND INTRAOCULAR LENS PLACEMENT (IOC) (Left)  Patient location during evaluation: Short Stay Anesthesia Type: MAC Level of consciousness: awake and alert, oriented and patient cooperative Pain management: pain level controlled Vital Signs Assessment: post-procedure vital signs reviewed and stable Respiratory status: spontaneous breathing, nonlabored ventilation and respiratory function stable Cardiovascular status: blood pressure returned to baseline Postop Assessment: no signs of nausea or vomiting Anesthetic complications: no    Last Vitals:  Filed Vitals:   03/31/16 0855 03/31/16 0900  BP: 121/77 111/85  Pulse:    Temp:    Resp: 18 16    Last Pain: There were no vitals filed for this visit.               Bernal Luhman J

## 2016-03-31 NOTE — Transfer of Care (Signed)
Immediate Anesthesia Transfer of Care Note  Patient: Barbara Bennett  Procedure(s) Performed: Procedure(s) with comments: CATARACT EXTRACTION PHACO AND INTRAOCULAR LENS PLACEMENT (IOC) (Left) - CDE: 6.45  Patient Location: Short Stay  Anesthesia Type:MAC  Level of Consciousness: awake, alert , oriented and patient cooperative  Airway & Oxygen Therapy: Patient Spontanous Breathing  Post-op Assessment: Report given to RN, Post -op Vital signs reviewed and stable and Patient moving all extremities  Post vital signs: Reviewed and stable  Last Vitals:  Filed Vitals:   03/31/16 0855 03/31/16 0900  BP: 121/77 111/85  Pulse:    Temp:    Resp: 18 16    Last Pain: There were no vitals filed for this visit.    Patients Stated Pain Goal: 8 (03/31/16 0831)  Complications: No apparent anesthesia complications

## 2016-03-31 NOTE — H&P (Signed)
I have reviewed the H&P, the patient was re-examined, and I have identified no interval changes in medical condition and plan of care since the history and physical of record  

## 2016-03-31 NOTE — Op Note (Signed)
Date of Admission: 03/31/2016  Date of Surgery: 03/31/2016   Pre-Op Dx: Cataract Left Eye  Post-Op Dx: Senile Nuclear Cataract Left  Eye,  Dx Code H25.12  Surgeon: Gemma PayorKerry Mikai Meints, M.D.  Assistants: None  Anesthesia: Topical with MAC  Indications: Painless, progressive loss of vision with compromise of daily activities.  Surgery: Cataract Extraction with Intraocular lens Implant Left Eye  Discription: The patient had dilating drops and viscous lidocaine placed into the Left eye in the pre-op holding area. After transfer to the operating room, a time out was performed. The patient was then prepped and draped. Beginning with a 75 degree blade a paracentesis port was made at the surgeon's 2 o'clock position. The anterior chamber was then filled with 1% non-preserved lidocaine. This was followed by filling the anterior chamber with Provisc.  A 2.704mm keratome blade was used to make a clear corneal incision at the temporal limbus.  A bent cystatome needle was used to create a continuous tear capsulotomy. Hydrodissection was performed with balanced salt solution on a Fine canula. The lens nucleus was then removed using the phacoemulsification handpiece. Residual cortex was removed with the I&A handpiece. The anterior chamber and capsular bag were refilled with Provisc. A posterior chamber intraocular lens was placed into the capsular bag with it's injector. The implant was positioned with the Kuglan hook. The Provisc was then removed from the anterior chamber and capsular bag with the I&A handpiece. Stromal hydration of the main incision and paracentesis port was performed with BSS on a Fine canula. The wounds were tested for leak which was negative. The patient tolerated the procedure well. There were no operative complications. The patient was then transferred to the recovery room in stable condition.  Complications: None  Specimen: None  EBL: None  Prosthetic device: Hoya iSert 250, power 7.0 D, SN  [id number].

## 2016-03-31 NOTE — Discharge Instructions (Signed)
Cataract Surgery, Care After °Refer to this sheet in the next few weeks. These instructions provide you with information on caring for yourself after your procedure. Your caregiver may also give you more specific instructions. Your treatment has been planned according to current medical practices, but problems sometimes occur. Call your caregiver if you have any problems or questions after your procedure.  °HOME CARE INSTRUCTIONS  °· Avoid strenuous activities as directed by your caregiver. °· Ask your caregiver when you can resume driving. °· Use eyedrops or other medicines to help healing and control pressure inside your eye as directed by your caregiver. °· Only take over-the-counter or prescription medicines for pain, discomfort, or fever as directed by your caregiver. °· Do not to touch or rub your eyes. °· You may be instructed to use a protective shield during the first few days and nights after surgery. If not, wear sunglasses to protect your eyes. This is to protect the eye from pressure or from being accidentally bumped. °· Keep the area around your eye clean and dry. Avoid swimming or allowing water to hit you directly in the face while showering. Keep soap and shampoo out of your eyes. °· Do not bend or lift heavy objects. Bending increases pressure in the eye. You can walk, climb stairs, and do light household chores. °· Do not put a contact lens into the eye that had surgery until your caregiver says it is okay to do so. °· Ask your doctor when you can return to work. This will depend on the kind of work that you do. If you work in a dusty environment, you may be advised to wear protective eyewear for a period of time. °· Ask your caregiver when it will be safe to engage in sexual activity. °· Continue with your regular eye exams as directed by your caregiver. °What to expect: °· It is normal to feel itching and mild discomfort for a few days after cataract surgery. Some fluid discharge is also common,  and your eye may be sensitive to light and touch. °· After 1 to 2 days, even moderate discomfort should disappear. In most cases, healing will take about 6 weeks. °· If you received an intraocular lens (IOL), you may notice that colors are very bright or have a blue tinge. Also, if you have been in bright sunlight, everything may appear reddish for a few hours. If you see these color tinges, it is because your lens is clear and no longer cloudy. Within a few months after receiving an IOL, these extra colors should go away. When you have healed, you will probably need new glasses. °SEEK MEDICAL CARE IF:  °· You have increased bruising around your eye. °· You have discomfort not helped by medicine. °SEEK IMMEDIATE MEDICAL CARE IF:  °· You have a  fever. °· You have a worsening or sudden vision loss. °· You have redness, swelling, or increasing pain in the eye. °· You have a thick discharge from the eye that had surgery. °MAKE SURE YOU: °· Understand these instructions. °· Will watch your condition. °· Will get help right away if you are not doing well or get worse. °  °This information is not intended to replace advice given to you by your health care provider. Make sure you discuss any questions you have with your health care provider. °  °Document Released: 06/10/2005 Document Revised: 12/12/2014 Document Reviewed: 07/15/2011 °Elsevier Interactive Patient Education ©2016 [employer] ° °

## 2016-04-01 ENCOUNTER — Encounter (HOSPITAL_COMMUNITY): Payer: Self-pay | Admitting: Ophthalmology

## 2016-04-25 ENCOUNTER — Encounter (HOSPITAL_COMMUNITY): Payer: Self-pay

## 2016-04-26 ENCOUNTER — Encounter (HOSPITAL_COMMUNITY)
Admission: RE | Admit: 2016-04-26 | Discharge: 2016-04-26 | Disposition: A | Discharge status: Home / Self Care | Source: Ambulatory Visit | Attending: Ophthalmology | Admitting: Ophthalmology

## 2016-04-28 ENCOUNTER — Ambulatory Visit (HOSPITAL_COMMUNITY): Admitting: Anesthesiology

## 2016-04-28 ENCOUNTER — Ambulatory Visit (HOSPITAL_COMMUNITY)
Admission: RE | Admit: 2016-04-28 | Discharge: 2016-04-28 | Disposition: A | Discharge status: Home / Self Care | Source: Ambulatory Visit | Attending: Ophthalmology | Admitting: Ophthalmology

## 2016-04-28 ENCOUNTER — Encounter (HOSPITAL_COMMUNITY)
Admission: RE | Disposition: A | Discharge status: Home / Self Care | Payer: Self-pay | Source: Ambulatory Visit | Attending: Ophthalmology

## 2016-04-28 ENCOUNTER — Encounter (HOSPITAL_COMMUNITY): Payer: Self-pay | Admitting: *Deleted

## 2016-04-28 DIAGNOSIS — E119 Type 2 diabetes mellitus without complications: Secondary | ICD-10-CM | POA: Insufficient documentation

## 2016-04-28 DIAGNOSIS — H2511 Age-related nuclear cataract, right eye: Secondary | ICD-10-CM | POA: Insufficient documentation

## 2016-04-28 DIAGNOSIS — D573 Sickle-cell trait: Secondary | ICD-10-CM | POA: Insufficient documentation

## 2016-04-28 DIAGNOSIS — Z6837 Body mass index (BMI) 37.0-37.9, adult: Secondary | ICD-10-CM | POA: Insufficient documentation

## 2016-04-28 DIAGNOSIS — M1991 Primary osteoarthritis, unspecified site: Secondary | ICD-10-CM | POA: Diagnosis not present

## 2016-04-28 HISTORY — PX: CATARACT EXTRACTION W/PHACO: SHX586

## 2016-04-28 LAB — GLUCOSE, CAPILLARY: Glucose-Capillary: 147 mg/dL — ABNORMAL HIGH (ref 65–99)

## 2016-04-28 SURGERY — PHACOEMULSIFICATION, CATARACT, WITH IOL INSERTION
Anesthesia: Monitor Anesthesia Care | Site: Eye | Laterality: Right

## 2016-04-28 MED ORDER — LACTATED RINGERS IV SOLN
INTRAVENOUS | Status: DC
  Administered 2016-04-28: 13:00:00 via INTRAVENOUS

## 2016-04-28 MED ORDER — POVIDONE-IODINE 5 % OP SOLN
OPHTHALMIC | Status: DC | PRN
  Administered 2016-04-28: 1 via OPHTHALMIC

## 2016-04-28 MED ORDER — MIDAZOLAM HCL 2 MG/2ML IJ SOLN
INTRAMUSCULAR | Status: AC
  Filled 2016-04-28: qty 2

## 2016-04-28 MED ORDER — FENTANYL CITRATE (PF) 100 MCG/2ML IJ SOLN
INTRAMUSCULAR | Status: AC
  Filled 2016-04-28: qty 2

## 2016-04-28 MED ORDER — LIDOCAINE HCL (PF) 1 % IJ SOLN
INTRAMUSCULAR | Status: DC | PRN
  Administered 2016-04-28: .4 mL

## 2016-04-28 MED ORDER — PROVISC 10 MG/ML IO SOLN
INTRAOCULAR | Status: DC | PRN
  Administered 2016-04-28: 0.85 mL via INTRAOCULAR

## 2016-04-28 MED ORDER — CYCLOPENTOLATE-PHENYLEPHRINE 0.2-1 % OP SOLN
1.0000 [drp] | OPHTHALMIC | Status: AC
  Administered 2016-04-28 (×3): 1 [drp] via OPHTHALMIC

## 2016-04-28 MED ORDER — MIDAZOLAM HCL 2 MG/2ML IJ SOLN
1.0000 mg | INTRAMUSCULAR | Status: DC | PRN
  Administered 2016-04-28: 2 mg via INTRAVENOUS

## 2016-04-28 MED ORDER — TETRACAINE HCL 0.5 % OP SOLN
1.0000 [drp] | OPHTHALMIC | Status: AC
  Administered 2016-04-28 (×3): 1 [drp] via OPHTHALMIC

## 2016-04-28 MED ORDER — FENTANYL CITRATE (PF) 100 MCG/2ML IJ SOLN
25.0000 ug | INTRAMUSCULAR | Status: AC
  Administered 2016-04-28: 25 ug via INTRAVENOUS

## 2016-04-28 MED ORDER — LIDOCAINE HCL 3.5 % OP GEL
1.0000 "application " | Freq: Once | OPHTHALMIC | Status: AC
  Administered 2016-04-28: 1 via OPHTHALMIC

## 2016-04-28 MED ORDER — EPINEPHRINE HCL 1 MG/ML IJ SOLN
INTRAMUSCULAR | Status: DC | PRN
  Administered 2016-04-28: 500 mL

## 2016-04-28 MED ORDER — NEOMYCIN-POLYMYXIN-DEXAMETH 3.5-10000-0.1 OP SUSP
OPHTHALMIC | Status: DC | PRN
  Administered 2016-04-28: 2 [drp] via OPHTHALMIC

## 2016-04-28 MED ORDER — BSS IO SOLN
INTRAOCULAR | Status: DC | PRN
  Administered 2016-04-28: 15 mL

## 2016-04-28 MED ORDER — PHENYLEPHRINE HCL 2.5 % OP SOLN
1.0000 [drp] | OPHTHALMIC | Status: AC
  Administered 2016-04-28 (×3): 1 [drp] via OPHTHALMIC

## 2016-04-28 SURGICAL SUPPLY — 23 items
CAPSULAR TENSION RING-AMO (OPHTHALMIC RELATED) IMPLANT
CLOTH BEACON ORANGE TIMEOUT ST (SAFETY) ×3 IMPLANT
EYE SHIELD UNIVERSAL CLEAR (GAUZE/BANDAGES/DRESSINGS) ×3 IMPLANT
GLOVE BIOGEL PI IND STRL 7.0 (GLOVE) ×2 IMPLANT
GLOVE BIOGEL PI IND STRL 7.5 (GLOVE) IMPLANT
GLOVE BIOGEL PI INDICATOR 7.0 (GLOVE) ×4
GLOVE BIOGEL PI INDICATOR 7.5 (GLOVE)
GLOVE EXAM NITRILE LRG STRL (GLOVE) IMPLANT
GLOVE EXAM NITRILE MD LF STRL (GLOVE) IMPLANT
KIT VITRECTOMY (OPHTHALMIC RELATED) IMPLANT
PAD ARMBOARD 7.5X6 YLW CONV (MISCELLANEOUS) ×3 IMPLANT
PROC W NO LENS (INTRAOCULAR LENS)
PROC W SPEC LENS (INTRAOCULAR LENS)
PROCESS W NO LENS (INTRAOCULAR LENS) IMPLANT
PROCESS W SPEC LENS (INTRAOCULAR LENS) IMPLANT
RETRACTOR IRIS SIGHTPATH (OPHTHALMIC RELATED) IMPLANT
RING MALYGIN (MISCELLANEOUS) IMPLANT
SIGHTPATH CAT PROC W REG LENS (Ophthalmic Related) ×3 IMPLANT
SYRINGE LUER LOK 1CC (MISCELLANEOUS) ×3 IMPLANT
TAPE SURG TRANSPORE 1 IN (GAUZE/BANDAGES/DRESSINGS) ×1 IMPLANT
TAPE SURGICAL TRANSPORE 1 IN (GAUZE/BANDAGES/DRESSINGS) ×2
VISCOELASTIC ADDITIONAL (OPHTHALMIC RELATED) IMPLANT
WATER STERILE IRR 250ML POUR (IV SOLUTION) ×3 IMPLANT

## 2016-04-28 NOTE — Transfer of Care (Signed)
Immediate Anesthesia Transfer of Care Note  Patient: Barbara Bennett  Procedure(s) Performed: Procedure(s) with comments: CATARACT EXTRACTION PHACO AND INTRAOCULAR LENS PLACEMENT (IOC) (Right) - CDE 5.00  Patient Location: Short Stay  Anesthesia Type:MAC  Level of Consciousness: awake, alert  and oriented  Airway & Oxygen Therapy: Patient Spontanous Breathing  Post-op Assessment: Report given to RN and Post -op Vital signs reviewed and stable  Post vital signs: Reviewed and stable  Last Vitals:  Filed Vitals:   04/28/16 1325 04/28/16 1330  BP: 104/62 109/65  Pulse:    Temp:    Resp: 15 29    Last Pain: There were no vitals filed for this visit.    Patients Stated Pain Goal: 5 (04/28/16 1224)  Complications: No apparent anesthesia complications

## 2016-04-28 NOTE — Op Note (Signed)
Date of Admission: 04/28/2016  Date of Surgery: 04/28/2016   Pre-Op Dx: Cataract Right Eye  Post-Op Dx: Senile Nuclear Cataract Right  Eye,  Dx Code H25.11  Surgeon: Gemma PayorKerry Prospero Mahnke, M.D.  Assistants: None  Anesthesia: Topical with MAC  Indications: Painless, progressive loss of vision with compromise of daily activities.  Surgery: Cataract Extraction with Intraocular lens Implant Right Eye  Discription: The patient had dilating drops and viscous lidocaine placed into the Right eye in the pre-op holding area. After transfer to the operating room, a time out was performed. The patient was then prepped and draped. Beginning with a 75 degree blade a paracentesis port was made at the surgeon's 2 o'clock position. The anterior chamber was then filled with 1% non-preserved lidocaine. This was followed by filling the anterior chamber with Provisc.  A 2.744mm keratome blade was used to make a clear corneal incision at the temporal limbus.  A bent cystatome needle was used to create a continuous tear capsulotomy. Hydrodissection was performed with balanced salt solution on a Fine canula. The lens nucleus was then removed using the phacoemulsification handpiece. Residual cortex was removed with the I&A handpiece. The anterior chamber and capsular bag were refilled with Provisc. A posterior chamber intraocular lens was placed into the capsular bag with it's injector. The implant was positioned with the Kuglan hook. The Provisc was then removed from the anterior chamber and capsular bag with the I&A handpiece. Stromal hydration of the main incision and paracentesis port was performed with BSS on a Fine canula. The wounds were tested for leak which was negative. The patient tolerated the procedure well. There were no operative complications. The patient was then transferred to the recovery room in stable condition.  Complications: None  Specimen: None  EBL: None  Prosthetic device: Hoya iSert 250, power 8.0 D,  SN [id number].

## 2016-04-28 NOTE — H&P (Signed)
I have reviewed the H&P, the patient was re-examined, and I have identified no interval changes in medical condition and plan of care since the history and physical of record  

## 2016-04-28 NOTE — Anesthesia Postprocedure Evaluation (Signed)
Anesthesia Post Note  Patient: Barbara Bennett  Procedure(s) Performed: Procedure(s) (LRB): CATARACT EXTRACTION PHACO AND INTRAOCULAR LENS PLACEMENT (IOC) (Right)  Patient location during evaluation: Short Stay Anesthesia Type: MAC Level of consciousness: awake and alert and oriented Pain management: pain level controlled Vital Signs Assessment: post-procedure vital signs reviewed and stable Respiratory status: spontaneous breathing Cardiovascular status: stable Postop Assessment: no signs of nausea or vomiting Anesthetic complications: no    Last Vitals:  Filed Vitals:   04/28/16 1325 04/28/16 1330  BP: 104/62 109/65  Pulse:    Temp:    Resp: 15 29    Last Pain: There were no vitals filed for this visit.               ADAMS, AMY A

## 2016-04-28 NOTE — Discharge Instructions (Signed)

## 2016-04-28 NOTE — Anesthesia Procedure Notes (Signed)
Procedure Name: MAC Date/Time: 04/28/2016 1:45 PM Performed by: Pernell DupreADAMS, Lelynd Poer A Pre-anesthesia Checklist: Patient identified, Emergency Drugs available, Suction available, Timeout performed and Patient being monitored Patient Re-evaluated:Patient Re-evaluated prior to inductionOxygen Delivery Method: Nasal Cannula

## 2016-04-28 NOTE — Anesthesia Preprocedure Evaluation (Signed)
Anesthesia Evaluation  Patient identified by MRN, date of birth, ID band Patient awake    Reviewed: Allergy & Precautions, H&P , NPO status , Patient's Chart, lab work & pertinent test results  History of Anesthesia Complications Negative for: history of anesthetic complications  Airway Mallampati: I  TM Distance: >3 FB Neck ROM: Full    Dental  (+) Poor Dentition   Pulmonary neg pulmonary ROS,    Pulmonary exam normal breath sounds clear to auscultation       Cardiovascular negative cardio ROS Normal cardiovascular exam Rhythm:Regular Rate:Normal     Neuro/Psych negative neurological ROS     GI/Hepatic negative GI ROS, Neg liver ROS,   Endo/Other  diabetes (pre DM)Morbid obesity  Renal/GU negative Renal ROS     Musculoskeletal  (+) Arthritis ,   Abdominal (+) + obese,   Peds  Hematology  (+) anemia , Sickle cell trait, has required transfusion in past   Anesthesia Other Findings   Reproductive/Obstetrics                             Anesthesia Physical Anesthesia Plan  ASA: II  Anesthesia Plan: MAC   Post-op Pain Management:    Induction: Intravenous  Airway Management Planned: Nasal Cannula  Additional Equipment:   Intra-op Plan:   Post-operative Plan:   Informed Consent: I have reviewed the patients History and Physical, chart, labs and discussed the procedure including the risks, benefits and alternatives for the proposed anesthesia with the patient or authorized representative who has indicated his/her understanding and acceptance.     Plan Discussed with:   Anesthesia Plan Comments:         Anesthesia Quick Evaluation

## 2016-04-29 ENCOUNTER — Encounter (HOSPITAL_COMMUNITY): Payer: Self-pay | Admitting: Ophthalmology

## 2017-08-10 LAB — HM DIABETES FOOT EXAM

## 2017-08-15 LAB — MICROALBUMIN, URINE: Microalb, Ur: 0.3

## 2017-08-24 LAB — FECAL OCCULT BLOOD, GUAIAC: Fecal Occult Blood: NEGATIVE

## 2018-05-01 ENCOUNTER — Other Ambulatory Visit (HOSPITAL_COMMUNITY): Payer: Self-pay | Admitting: Pulmonary Disease

## 2018-05-01 ENCOUNTER — Ambulatory Visit (HOSPITAL_COMMUNITY)
Admission: RE | Admit: 2018-05-01 | Discharge: 2018-05-01 | Disposition: A | Discharge status: Home / Self Care | Source: Ambulatory Visit | Attending: Pulmonary Disease | Admitting: Pulmonary Disease

## 2018-05-01 DIAGNOSIS — J40 Bronchitis, not specified as acute or chronic: Secondary | ICD-10-CM | POA: Diagnosis present

## 2018-06-06 LAB — LIPID PANEL
Cholesterol: 157 (ref 0–200)
HDL: 43 (ref 35–70)
LDL Cholesterol: 96
Triglycerides: 85 (ref 40–160)

## 2018-06-06 LAB — COMPREHENSIVE METABOLIC PANEL
Albumin: 3.8 (ref 3.5–5.0)
Calcium: 9.1 (ref 8.7–10.7)
GFR calc Af Amer: 89
GFR calc non Af Amer: 77
Globulin: 3.1

## 2018-06-06 LAB — BASIC METABOLIC PANEL
BUN: 16 (ref 4–21)
CO2: 29 — AB (ref 13–22)
Chloride: 104 (ref 99–108)
Creatinine: 0.8 (ref <=1.1)
Glucose: 112
Potassium: 4.4 (ref 3.4–5.3)
Sodium: 139 (ref 137–147)

## 2018-06-06 LAB — HEMOGLOBIN A1C: Hemoglobin A1C: 5.9

## 2018-06-06 LAB — TSH: TSH: 5.74 (ref <=5.90)

## 2019-11-03 DIAGNOSIS — D649 Anemia, unspecified: Secondary | ICD-10-CM

## 2019-11-03 DIAGNOSIS — K580 Irritable bowel syndrome with diarrhea: Secondary | ICD-10-CM

## 2019-11-03 DIAGNOSIS — E1169 Type 2 diabetes mellitus with other specified complication: Secondary | ICD-10-CM | POA: Insufficient documentation

## 2019-11-03 DIAGNOSIS — E785 Hyperlipidemia, unspecified: Secondary | ICD-10-CM

## 2019-11-03 DIAGNOSIS — E118 Type 2 diabetes mellitus with unspecified complications: Secondary | ICD-10-CM | POA: Insufficient documentation

## 2019-11-03 DIAGNOSIS — E669 Obesity, unspecified: Secondary | ICD-10-CM

## 2019-11-03 DIAGNOSIS — E039 Hypothyroidism, unspecified: Secondary | ICD-10-CM | POA: Insufficient documentation

## 2019-11-03 DIAGNOSIS — E119 Type 2 diabetes mellitus without complications: Secondary | ICD-10-CM | POA: Insufficient documentation

## 2019-11-03 DIAGNOSIS — I10 Essential (primary) hypertension: Secondary | ICD-10-CM

## 2020-03-26 ENCOUNTER — Ambulatory Visit: Admitting: Family Medicine

## 2020-04-03 ENCOUNTER — Ambulatory Visit
Admission: EM | Admit: 2020-04-03 | Discharge: 2020-04-03 | Disposition: A | Discharge status: Home / Self Care | Payer: Medicare Other | Attending: Emergency Medicine | Admitting: Emergency Medicine

## 2020-04-03 ENCOUNTER — Other Ambulatory Visit: Payer: Self-pay

## 2020-04-03 DIAGNOSIS — H00014 Hordeolum externum left upper eyelid: Secondary | ICD-10-CM

## 2020-04-03 MED ORDER — ERYTHROMYCIN 5 MG/GM OP OINT: TOPICAL_OINTMENT | OPHTHALMIC | 0 refills | Status: DC

## 2020-04-03 NOTE — Discharge Instructions (Signed)
Continue warm compresses at home.  Soak a wash cloth in warm (not scalding) water and place it over the eyes. As the wash cloth cools, it should be rewarmed and replaced for a total of 5 to 10 minutes of soaking time. Warm compresses should be applied two to four times a day as long as the patient has symptoms Perform lid washing: Either warm water or very dilute baby shampoo can be placed on a clean wash cloth, gauze pad, or cotton swab. Then be advised to gently clean along the lashes and lid margin to remove the accumulated material with care to avoid contacting the ocular surface. If shampoo is used, thorough rinsing is recommended. Vigorous washing should be avoided, as it may cause more irritation.  Prescribed erythromycin ointment.  Apply up to 6 times daily for 5-7 days, or until symptomatic improvement Follow up with ophthalmology for further evaluation and management if symptoms persists Return or go to ER if you have any new or worsening symptoms such as fever, chills, redness, swelling, eye pain, painful eye movements, vision changes, etc... 

## 2020-04-03 NOTE — ED Provider Notes (Signed)
[facility]   [id number] 04/03/20 Arrival Time: 1916  CC: LT upper eyelid pain and swelling  SUBJECTIVE:  Barbara Bennett is a 64 y.o. female who presents with complaint of LT upper eyelid pain and swelling x 1 week.  Denies a precipitating event, trauma, or changes in cosmetic products.  Has tried warm compresses without relief.  Symptoms are made worse to the touch and at night.  Denies similar symptoms in the past.  Denies fever, chills, nausea, vomiting, eye pain, painful eye movements, discharge, itching, vision changes, double vision, FB sensation, periorbital erythema.     Denies contact lens use.    ROS: As per HPI.  All other pertinent ROS negative.     Past Medical History:  Diagnosis Date  . Anemia   . Anemia, unspecified   . Arthritis   . Bell's palsy   . Essential (primary) hypertension   . Hyperlipidemia, unspecified   . Hypothyroidism, unspecified   . Irritable bowel syndrome with diarrhea   . Obesity, unspecified   . Type 2 diabetes mellitus without complications [facility])    Past Surgical History:  Procedure Laterality Date  . CATARACT EXTRACTION W/PHACO Left 03/31/2016   Procedure: CATARACT EXTRACTION PHACO AND INTRAOCULAR LENS PLACEMENT (IOC);  Surgeon: Gemma Payor, MD;  Location: [facility];  Service: Ophthalmology;  Laterality: Left;  CDE: 6.45  . CATARACT EXTRACTION W/PHACO Right 04/28/2016   Procedure: CATARACT EXTRACTION PHACO AND INTRAOCULAR LENS PLACEMENT (IOC);  Surgeon: Gemma Payor, MD;  Location: [facility];  Service: Ophthalmology;  Laterality: Right;  CDE 5.00  . KNEE ARTHROSCOPY Left    Allergies  Allergen Reactions  . Penicillins Rash    Has patient had a PCN reaction causing immediate rash, facial/tongue/throat swelling, SOB or lightheadedness with hypotension: Yes Has patient had a PCN reaction causing severe rash involving mucus membranes or skin necrosis: No Has patient had a PCN reaction that required hospitalization No Has patient had a PCN  reaction occurring within the last 10 years: Yes If all of the above answers are "NO", then may proceed with Cephalosporin use.   . Sulfa Antibiotics Rash   No current facility-administered medications on file prior to encounter.   Current Outpatient Medications on File Prior to Encounter  Medication Sig Dispense Refill  . Cyanocobalamin (VITAMIN B 12 PO) Take 1 tablet by mouth daily.    . ferrous sulfate 325 (65 FE) MG tablet Take 325 mg by mouth daily with breakfast.    . furosemide (LASIX) 40 MG tablet Take 40 mg by mouth daily as needed.    [name] ibuprofen (ADVIL,MOTRIN) 200 MG tablet Take 200 mg by mouth every 6 (six) hours as needed for moderate pain.    [name] irbesartan (AVAPRO) 75 MG tablet Take 75 mg by mouth daily.    [name] levothyroxine (SYNTHROID) 50 MCG tablet Take 50 mcg by mouth daily before breakfast.    . losartan (COZAAR) 25 MG tablet Take 25 mg by mouth daily.    . metFORMIN (GLUCOPHAGE) 500 MG tablet Take 500 mg by mouth 2 (two) times daily with a meal.     Social History   Socioeconomic History  . Marital status: Married    Spouse name: Not on file  . Number of children: Not on file  . Years of education: Not on file  . Highest education level: Not on file  Occupational History  . Not on file  Tobacco Use  . Smoking status: Never Smoker  Substance and Sexual Activity  .  Alcohol use: No  . Drug use: No  . Sexual activity: Never  Other Topics Concern  . Not on file  Social History Narrative  . Not on file   Social Determinants of Health   Financial Resource Strain:   . Difficulty of Paying Living Expenses:   Food Insecurity:   . Worried About Charity fundraiser in the Last Year:   . Arboriculturist in the Last Year:   Transportation Needs:   . Film/video editor (Medical):   [name] Lack of Transportation (Non-Medical):   Physical Activity:   . Days of Exercise per Week:   . Minutes of Exercise per Session:   Stress:   . Feeling of Stress :   Social  Connections:   . Frequency of Communication with Friends and Family:   . Frequency of Social Gatherings with Friends and Family:   . Attends Religious Services:   . Active Member of Clubs or Organizations:   . Attends Archivist Meetings:   [name] Marital Status:   Intimate Partner Violence:   . Fear of Current or Ex-Partner:   . Emotionally Abused:   [name] Physically Abused:   . Sexually Abused:    No family history on file.  OBJECTIVE:  Vitals:   04/03/20 1921  BP: (!) 154/84  Pulse: 99  Resp: 16  Temp: 98.4 F (36.9 C)  TempSrc: Oral  SpO2: 96%    General appearance: alert; no distress Eyes: LT upper eyelid with swelling, erythema, and stye present towards inner corner; no conjunctival erythema. PERRL; EOMI without discomfort;  no obvious drainage Neck: supple Lungs: normal respiratory effort Skin: warm and dry Psychological: alert and cooperative; normal mood and affect   ASSESSMENT & PLAN:  1. Hordeolum externum of left upper eyelid     Meds ordered this encounter  Medications  . erythromycin ophthalmic ointment    Sig: Place a 1/2 inch ribbon of ointment into the upper eyelid.    Dispense:  3.5 g    Refill:  0    Order Specific Question:   Supervising Provider    Answer:   Raylene Everts [[id number]]   Continue warm compresses at home.  Soak a wash cloth in warm (not scalding) water and place it over the eyes. As the wash cloth cools, it should be rewarmed and replaced for a total of 5 to 10 minutes of soaking time. Warm compresses should be applied two to four times a day as long as the patient has symptoms Perform lid washing: Either warm water or very dilute baby shampoo can be placed on a clean wash cloth, gauze pad, or cotton swab. Then be advised to gently clean along the lashes and lid margin to remove the accumulated material with care to avoid contacting the ocular surface. If shampoo is used, thorough rinsing is recommended. Vigorous washing should  be avoided, as it may cause more irritation.  Prescribed erythromycin ointment.  Apply up to 6 times daily for 5-7 days, or until symptomatic improvement Follow up with ophthalmology for further evaluation and management if symptoms persists Return or go to ER if you have any new or worsening symptoms such as fever, chills, redness, swelling, eye pain, painful eye movements, vision changes, etc...  Reviewed expectations re: course of current medical issues. Questions answered. Outlined signs and symptoms indicating need for more acute intervention. Patient verbalized understanding. After Visit Summary given.   Lestine Box, PA-C 04/03/20 1928

## 2020-04-03 NOTE — ED Triage Notes (Signed)
Pt presents with complaints of left eye pain and swelling x 1 week. Denies relief with otc medications.

## 2020-08-19 ENCOUNTER — Encounter (INDEPENDENT_AMBULATORY_CARE_PROVIDER_SITE_OTHER): Payer: Self-pay | Admitting: Nurse Practitioner

## 2020-08-19 ENCOUNTER — Ambulatory Visit (INDEPENDENT_AMBULATORY_CARE_PROVIDER_SITE_OTHER): Payer: Medicare Other | Admitting: Nurse Practitioner

## 2020-08-19 ENCOUNTER — Other Ambulatory Visit: Payer: Self-pay

## 2020-08-19 VITALS — BP 121/86 | HR 73 | Temp 97.3°F | Resp 16 | Ht 60.0 in | Wt 186.0 lb

## 2020-08-19 DIAGNOSIS — I10 Essential (primary) hypertension: Secondary | ICD-10-CM | POA: Diagnosis not present

## 2020-08-19 DIAGNOSIS — E039 Hypothyroidism, unspecified: Secondary | ICD-10-CM | POA: Diagnosis not present

## 2020-08-19 DIAGNOSIS — D509 Iron deficiency anemia, unspecified: Secondary | ICD-10-CM | POA: Diagnosis not present

## 2020-08-19 DIAGNOSIS — E119 Type 2 diabetes mellitus without complications: Secondary | ICD-10-CM | POA: Diagnosis not present

## 2020-08-19 DIAGNOSIS — E785 Hyperlipidemia, unspecified: Secondary | ICD-10-CM

## 2020-08-19 MED ORDER — METFORMIN HCL 500 MG PO TABS: 500.0000 mg | ORAL_TABLET | Freq: Two times a day (BID) | ORAL | 0 refills | Status: DC

## 2020-08-19 MED ORDER — LEVOTHYROXINE SODIUM 50 MCG PO TABS: 50.0000 ug | ORAL_TABLET | Freq: Every day | ORAL | 0 refills | Status: DC

## 2020-08-19 MED ORDER — IRBESARTAN 75 MG PO TABS
75.0000 mg | ORAL_TABLET | Freq: Every day | ORAL | 0 refills | Status: DC
Start: 2020-08-19 — End: 2020-12-01

## 2020-08-19 NOTE — Addendum Note (Signed)
Addended by: Jiles Prows E on: 08/19/2020 09:07 AM   Modules accepted: Orders

## 2020-08-19 NOTE — Progress Notes (Signed)
Subjective:  Patient ID: Barbara Bennett, female    DOB: 06/24/1956  Age: 64 y.o. MRN: [id number]  CC:  Chief Complaint  Patient presents with  . Establish Care      HPI  This patient arrives today to establish care in this office.  She was receiving her primary care from Dr. Luan Pulling, as he has now retired she is seeking to establish care at this office.  She has no acute complaints today but does have a medical history pertinent for anemia, arthritis, hypertension, hyperlipidemia, hypothyroidism, IBS with diarrhea, obesity, and type 2 diabetes.  Past Medical History:  Diagnosis Date  . Anemia   . Anemia, unspecified   . Arthritis   . Bell's palsy   . Essential (primary) hypertension   . Hyperlipidemia, unspecified   . Hypothyroidism, unspecified   . Irritable bowel syndrome with diarrhea   . Obesity, unspecified   . Type 2 diabetes mellitus without complications (HCC)       Family History  Problem Relation Age of Onset  . Diabetes Mother   . Heart disease Mother   . Heart disease Father   . Diabetes Sister   . Renal Disease Sister        Dialysis  . Asthma Sister   . Hypertension Sister   . Diabetes Sister   . Arthritis Sister   . Diabetes Sister   . Renal Disease Sister        Dialysis  . Hypertension Sister     Social History   Social History Narrative  . Not on file   Social History   Tobacco Use  . Smoking status: Never Smoker  . Smokeless tobacco: Never Used  Substance Use Topics  . Alcohol use: No     Current Meds  Medication Sig  . Cholecalciferol 1.25 MG (50000 UT) TABS Take 1 tablet by mouth daily.  . Cinnamon 500 MG capsule Take 1,000 mg by mouth daily.  . ferrous sulfate 325 (65 FE) MG tablet Take 325 mg by mouth daily with breakfast.  . ibuprofen (ADVIL,MOTRIN) 200 MG tablet Take 200 mg by mouth every 6 (six) hours as needed for moderate pain.  [name] irbesartan (AVAPRO) 75 MG tablet Take 75 mg by mouth daily.  [name] levothyroxine  (SYNTHROID) 50 MCG tablet Take 50 mcg by mouth daily before breakfast.  . Menaquinone-7 (VITAMIN K2 PO) Take 1 tablet by mouth daily.  . metFORMIN (GLUCOPHAGE) 500 MG tablet Take 500 mg by mouth 2 (two) times daily with a meal.  . Multiple Vitamin (MULTIVITAMIN WITH MINERALS) TABS tablet Take 1 tablet by mouth daily.  . NON FORMULARY Take 1 tablet by mouth daily. Quercetin    ROS:  Review of Systems  Constitutional: Negative for fever, malaise/fatigue and weight loss.  Eyes: Negative for blurred vision.  Respiratory: Negative for shortness of breath.   Cardiovascular: Negative for chest pain.  Gastrointestinal: Negative for abdominal pain and blood in stool.  Neurological: Negative for dizziness and headaches.     Objective:   Today's Vitals: BP 121/86   Pulse 73   Temp (!) 97.3 F (36.3 C)   Resp 16   Ht 5' (1.524 m)   Wt 186 lb (84.4 kg)   LMP 04/01/2012   SpO2 99%   BMI 36.33 kg/m  Vitals with BMI 08/19/2020 04/03/2020 04/28/2016  Height 5' 0"  - -  Weight 186 lbs - -  BMI 57.84 - -  Systolic 696 295 284  Diastolic  86 84 69  Pulse 73 99 65     Physical Exam Vitals reviewed.  Constitutional:      General: She is not in acute distress.    Appearance: Normal appearance.  HENT:     Head: Normocephalic and atraumatic.  Neck:     Vascular: No carotid bruit.  Cardiovascular:     Rate and Rhythm: Normal rate and regular rhythm.     Pulses: Normal pulses.     Heart sounds: Normal heart sounds.  Pulmonary:     Effort: Pulmonary effort is normal.     Breath sounds: Normal breath sounds.  Skin:    General: Skin is warm and dry.  Neurological:     General: No focal deficit present.     Mental Status: She is alert and oriented to person, place, and time.  Psychiatric:        Mood and Affect: Mood normal.        Behavior: Behavior normal.        Judgment: Judgment normal.          Assessment and Plan   1. Essential (primary) hypertension   2.  Hypothyroidism, unspecified type   3. Type 2 diabetes mellitus without complication, unspecified whether long term insulin use ([facility])   4. Iron deficiency anemia, unspecified iron deficiency anemia type   5. Hyperlipidemia, unspecified hyperlipidemia type      Plan: 1.-5.  We will collect blood work as listed below for further evaluation, and in the meantime she will continue on her medications as currently prescribed.   Tests ordered Orders Placed This Encounter  Procedures  . CBC  . Ferritin  . Iron  . CMP with eGFR([facility])  . Hemoglobin A1c  . Microalbumin/Creatinine Ratio, Urine  . Lipid Panel  . TSH  . T3, Free  . T4, Free      No orders of the defined types were placed in this encounter.   Patient to follow-up in 1 month or sooner as needed.  Ailene Ards, NP

## 2020-08-19 NOTE — Patient Instructions (Signed)
[facility] Dietary Recommendations for Weight Loss What to Avoid . Avoid added sugars o Often added sugar can be found in processed foods such as many condiments, dry cereals, cakes, cookies, chips, crisps, crackers, candies, sweetened drinks, etc.  o Read labels and AVOID/DECREASE use of foods with the following in their ingredient list: Sugar, fructose, high fructose corn syrup, sucrose, glucose, maltose, dextrose, molasses, cane sugar, brown sugar, any type of syrup, agave nectar, etc.   . Avoid snacking in between meals . Avoid foods made with flour o If you are going to eat food made with flour, choose those made with whole-grains; and, minimize your consumption as much as is tolerable . Avoid processed foods o These foods are generally stocked in the middle of the grocery store. Focus on shopping on the perimeter of the grocery.  . Avoid Meat  o We recommend following a plant-based diet at [facility]. Thus, we recommend avoiding meat as a general rule. Consider eating beans, legumes, eggs, and/or dairy products for regular protein sources o If you plan on eating meat limit to 4 ounces of meat at a time and choose lean options such as Fish, chicken, turkey. Avoid red meat intake such as pork and/or steak What to Include . Vegetables o GREEN LEAFY VEGETABLES: Kale, spinach, mustard greens, collard greens, cabbage, broccoli, etc. o OTHER: Asparagus, cauliflower, eggplant, carrots, peas, Brussel sprouts, tomatoes, bell peppers, zucchini, beets, cucumbers, etc. . Grains, seeds, and legumes o Beans: kidney beans, black eyed peas, garbanzo beans, black beans, pinto beans, etc. o Whole, unrefined grains: brown rice, barley, bulgur, oatmeal, etc. . Healthy fats  o Avoid highly processed fats such as vegetable oil o Examples of healthy fats: avocado, olives, virgin olive oil, dark chocolate (?72% Cocoa), nuts (peanuts, almonds, walnuts, cashews, pecans, etc.) . None to Low  Intake of Animal Sources of Protein o Meat sources: chicken, turkey, salmon, tuna. Limit to 4 ounces of meat at one time. o Consider limiting dairy sources, but when choosing dairy focus on: PLAIN Greek yogurt, cottage cheese, high-protein milk . Fruit o Choose berries  When to Eat . Intermittent Fasting: o Choosing not to eat for a specific time period, but DO FOCUS ON HYDRATION when fasting o Multiple Techniques: - Time Restricted Eating: eat 3 meals in a day, each meal lasting no more than 60 minutes, no snacks between meals - 16-18 hour fast: fast for 16 to 18 hours up to 7 days a week. Often suggested to start with 2-3 nonconsecutive days per week.  . Remember the time you sleep is counted as fasting.  . Examples of eating schedule: Fast from 7:00pm-11:00am. Eat between 11:00am-7:00pm.  - 24-hour fast: fast for 24 hours up to every other day. Often suggested to start with 1 day per week . Remember the time you sleep is counted as fasting . Examples of eating schedule:  o Eating day: eat 2-3 meals on your eating day. If doing 2 meals, each meal should last no more than 90 minutes. If doing 3 meals, each meal should last no more than 60 minutes. Finish last meal by 7:00pm. o Fasting day: Fast until 7:00pm.  o IF YOU FEEL UNWELL FOR ANY REASON/IN ANY WAY WHEN FASTING, STOP FASTING BY EATING A NUTRITIOUS SNACK OR LIGHT MEAL o ALWAYS FOCUS ON HYDRATION DURING FASTS - Acceptable Hydration sources: water, broths, tea/coffee (black tea/coffee is best but using a small amount of whole-fat dairy products in coffee/tea is acceptable).  -   Poor Hydration Sources: anything with sugar or artificial sweeteners added to it  These recommendations have been developed for patients that are actively receiving medical care from either Dr. [facility] or Cheyanna Strick, DNP, NP-C at [facility]. These recommendations are developed for patients with specific medical conditions and are not meant to be  distributed or used by others that are not actively receiving care from either provider listed above at [facility]. It is not appropriate to participate in the above eating plans without proper medical supervision.   Reference: Fung, J. The obesity code. [place]: [place]; 2016.   

## 2020-08-20 ENCOUNTER — Telehealth (INDEPENDENT_AMBULATORY_CARE_PROVIDER_SITE_OTHER): Payer: Self-pay | Admitting: Nurse Practitioner

## 2020-08-20 ENCOUNTER — Other Ambulatory Visit (INDEPENDENT_AMBULATORY_CARE_PROVIDER_SITE_OTHER): Payer: Self-pay | Admitting: Nurse Practitioner

## 2020-08-20 DIAGNOSIS — E785 Hyperlipidemia, unspecified: Secondary | ICD-10-CM

## 2020-08-20 DIAGNOSIS — D509 Iron deficiency anemia, unspecified: Secondary | ICD-10-CM

## 2020-08-20 LAB — IRON: Iron: 95 ug/dL (ref 45–160)

## 2020-08-20 LAB — LIPID PANEL
Cholesterol: 155 mg/dL (ref <200)
HDL: 42 mg/dL — ABNORMAL LOW (ref >=50)
LDL Cholesterol (Calc): 92 mg/dL (calc)
Non-HDL Cholesterol (Calc): 113 mg/dL (calc) (ref <130)
Total CHOL/HDL Ratio: 3.7 (calc) (ref <5.0)
Triglycerides: 118 mg/dL (ref <150)

## 2020-08-20 LAB — COMPLETE METABOLIC PANEL WITH GFR
AG Ratio: 1.5 (calc) (ref 1.0–2.5)
ALT: 10 U/L (ref 6–29)
AST: 17 U/L (ref 10–35)
Albumin: 4.3 g/dL (ref 3.6–5.1)
Alkaline phosphatase (APISO): 40 U/L (ref 37–153)
BUN/Creatinine Ratio: 17 (calc) (ref 6–22)
BUN: 18 mg/dL (ref 7–25)
CO2: 27 mmol/L (ref 20–32)
Calcium: 9.4 mg/dL (ref 8.6–10.4)
Chloride: 107 mmol/L (ref 98–110)
Creat: 1.07 mg/dL — ABNORMAL HIGH (ref 0.50–0.99)
GFR, Est African American: 63 mL/min/{1.73_m2} (ref >=60)
GFR, Est Non African American: 54 mL/min/{1.73_m2} — ABNORMAL LOW (ref >=60)
Globulin: 2.8 g/dL (calc) (ref 1.9–3.7)
Glucose, Bld: 98 mg/dL (ref 65–99)
Potassium: 4.8 mmol/L (ref 3.5–5.3)
Sodium: 141 mmol/L (ref 135–146)
Total Bilirubin: 0.5 mg/dL (ref 0.2–1.2)
Total Protein: 7.1 g/dL (ref 6.1–8.1)

## 2020-08-20 LAB — T4, FREE: Free T4: 1.2 ng/dL (ref 0.8–1.8)

## 2020-08-20 LAB — CBC
HCT: 33.4 % — ABNORMAL LOW (ref 35.0–45.0)
Hemoglobin: 10.7 g/dL — ABNORMAL LOW (ref 11.7–15.5)
MCH: 27.6 pg (ref 27.0–33.0)
MCHC: 32 g/dL (ref 32.0–36.0)
MCV: 86.3 fL (ref 80.0–100.0)
MPV: 10.2 fL (ref 7.5–12.5)
Platelets: 180 10*3/uL (ref 140–400)
RBC: 3.87 10*6/uL (ref 3.80–5.10)
RDW: 12.1 % (ref 11.0–15.0)
WBC: 5.5 10*3/uL (ref 3.8–10.8)

## 2020-08-20 LAB — T3, FREE: T3, Free: 2.6 pg/mL (ref 2.3–4.2)

## 2020-08-20 LAB — MICROALBUMIN / CREATININE URINE RATIO
Creatinine, Urine: 72 mg/dL (ref 20–275)
Microalb Creat Ratio: 4 mcg/mg creat (ref <30)
Microalb, Ur: 0.3 mg/dL

## 2020-08-20 LAB — HEMOGLOBIN A1C
Hgb A1c MFr Bld: 5.4 % of total Hgb (ref <5.7)
Mean Plasma Glucose: 108 (calc)
eAG (mmol/L): 6 (calc)

## 2020-08-20 LAB — TSH: TSH: 4.43 mIU/L (ref 0.40–4.50)

## 2020-08-20 LAB — FERRITIN: Ferritin: 309 ng/mL — ABNORMAL HIGH (ref 16–288)

## 2020-08-20 MED ORDER — ATORVASTATIN CALCIUM 10 MG PO TABS: 10.0000 mg | ORAL_TABLET | Freq: Every day | ORAL | 0 refills | Status: DC

## 2020-08-20 NOTE — Telephone Encounter (Signed)
Please call patient and schedule her for lab draw within the next couple of weeks.  I will place the orders in epic today.

## 2020-09-03 ENCOUNTER — Other Ambulatory Visit (INDEPENDENT_AMBULATORY_CARE_PROVIDER_SITE_OTHER): Payer: Medicare Other

## 2020-09-09 ENCOUNTER — Other Ambulatory Visit (INDEPENDENT_AMBULATORY_CARE_PROVIDER_SITE_OTHER): Payer: Medicare Other

## 2020-09-09 ENCOUNTER — Encounter (INDEPENDENT_AMBULATORY_CARE_PROVIDER_SITE_OTHER): Payer: Self-pay

## 2020-09-10 LAB — CBC WITH DIFFERENTIAL/PLATELET
Absolute Monocytes: 454 cells/uL (ref 200–950)
Basophils Absolute: 38 cells/uL (ref 0–200)
Basophils Relative: 0.6 %
Eosinophils Absolute: 166 cells/uL (ref 15–500)
Eosinophils Relative: 2.6 %
HCT: 32.1 % — ABNORMAL LOW (ref 35.0–45.0)
Hemoglobin: 10.4 g/dL — ABNORMAL LOW (ref 11.7–15.5)
Lymphs Abs: 1587 cells/uL (ref 850–3900)
MCH: 27.4 pg (ref 27.0–33.0)
MCHC: 32.4 g/dL (ref 32.0–36.0)
MCV: 84.7 fL (ref 80.0–100.0)
MPV: 10.2 fL (ref 7.5–12.5)
Monocytes Relative: 7.1 %
Neutro Abs: 4154 cells/uL (ref 1500–7800)
Neutrophils Relative %: 64.9 %
Platelets: 159 10*3/uL (ref 140–400)
RBC: 3.79 10*6/uL — ABNORMAL LOW (ref 3.80–5.10)
RDW: 12.1 % (ref 11.0–15.0)
Total Lymphocyte: 24.8 %
WBC: 6.4 10*3/uL (ref 3.8–10.8)

## 2020-09-10 LAB — IRON, TOTAL AND TOTAL IRON BINDING CAPACITY (REFL)
%SAT: 13 % (calc) — ABNORMAL LOW (ref 16–45)
Iron: 36 ug/dL — ABNORMAL LOW (ref 45–160)
TIBC: 275 mcg/dL (calc) (ref 250–450)

## 2020-09-10 LAB — PATHOLOGIST SMEAR REVIEW

## 2020-09-10 LAB — FERRITIN: Ferritin: 291 ng/mL — ABNORMAL HIGH (ref 16–288)

## 2020-09-11 ENCOUNTER — Other Ambulatory Visit (INDEPENDENT_AMBULATORY_CARE_PROVIDER_SITE_OTHER): Payer: Self-pay | Admitting: Nurse Practitioner

## 2020-09-11 DIAGNOSIS — D649 Anemia, unspecified: Secondary | ICD-10-CM

## 2020-09-14 NOTE — Telephone Encounter (Signed)
Return call back to given lab results. No answer; left a voicemail explain results & mychart is posted to view at her leisure.

## 2020-09-22 ENCOUNTER — Encounter: Payer: Self-pay | Admitting: Internal Medicine

## 2020-09-29 ENCOUNTER — Other Ambulatory Visit: Payer: Self-pay

## 2020-09-29 ENCOUNTER — Ambulatory Visit (INDEPENDENT_AMBULATORY_CARE_PROVIDER_SITE_OTHER): Payer: Medicare Other | Admitting: Internal Medicine

## 2020-09-29 ENCOUNTER — Encounter (INDEPENDENT_AMBULATORY_CARE_PROVIDER_SITE_OTHER): Payer: Self-pay | Admitting: Internal Medicine

## 2020-09-29 VITALS — BP 126/78 | HR 83 | Temp 97.7°F | Ht 60.0 in | Wt 185.6 lb

## 2020-09-29 DIAGNOSIS — D649 Anemia, unspecified: Secondary | ICD-10-CM

## 2020-09-29 DIAGNOSIS — I10 Essential (primary) hypertension: Secondary | ICD-10-CM

## 2020-09-29 DIAGNOSIS — E119 Type 2 diabetes mellitus without complications: Secondary | ICD-10-CM | POA: Diagnosis not present

## 2020-09-29 DIAGNOSIS — Z23 Encounter for immunization: Secondary | ICD-10-CM | POA: Diagnosis not present

## 2020-09-29 DIAGNOSIS — E039 Hypothyroidism, unspecified: Secondary | ICD-10-CM | POA: Diagnosis not present

## 2020-09-29 DIAGNOSIS — E785 Hyperlipidemia, unspecified: Secondary | ICD-10-CM

## 2020-09-29 MED ORDER — NP THYROID 60 MG PO TABS
60.0000 mg | ORAL_TABLET | Freq: Every day | ORAL | 3 refills | Status: DC
Start: 2020-09-29 — End: 2020-11-10

## 2020-09-29 NOTE — Patient Instructions (Signed)
[facility] Dietary Recommendations for Weight Loss What to Avoid . Avoid added sugars o Often added sugar can be found in processed foods such as many condiments, dry cereals, cakes, cookies, chips, crisps, crackers, candies, sweetened drinks, etc.  o Read labels and AVOID/DECREASE use of foods with the following in their ingredient list: Sugar, fructose, high fructose corn syrup, sucrose, glucose, maltose, dextrose, molasses, cane sugar, brown sugar, any type of syrup, agave nectar, etc.   . Avoid snacking in between meals . Avoid foods made with flour o If you are going to eat food made with flour, choose those made with whole-grains; and, minimize your consumption as much as is tolerable . Avoid processed foods o These foods are generally stocked in the middle of the grocery store. Focus on shopping on the perimeter of the grocery.  . Avoid Meat  o We recommend following a plant-based diet at [facility]. Thus, we recommend avoiding meat as a general rule. Consider eating beans, legumes, eggs, and/or dairy products for regular protein sources o If you plan on eating meat limit to 4 ounces of meat at a time and choose lean options such as Fish, chicken, turkey. Avoid red meat intake such as pork and/or steak What to Include . Vegetables o GREEN LEAFY VEGETABLES: Kale, spinach, mustard greens, collard greens, cabbage, broccoli, etc. o OTHER: Asparagus, cauliflower, eggplant, carrots, peas, Brussel sprouts, tomatoes, bell peppers, zucchini, beets, cucumbers, etc. . Grains, seeds, and legumes o Beans: kidney beans, black eyed peas, garbanzo beans, black beans, pinto beans, etc. o Whole, unrefined grains: brown rice, barley, bulgur, oatmeal, etc. . Healthy fats  o Avoid highly processed fats such as vegetable oil o Examples of healthy fats: avocado, olives, virgin olive oil, dark chocolate (?72% Cocoa), nuts (peanuts, almonds, walnuts, cashews, pecans, etc.) . None to Low  Intake of Animal Sources of Protein o Meat sources: chicken, turkey, salmon, tuna. Limit to 4 ounces of meat at one time. o Consider limiting dairy sources, but when choosing dairy focus on: PLAIN Greek yogurt, cottage cheese, high-protein milk . Fruit o Choose berries  When to Eat . Intermittent Fasting: o Choosing not to eat for a specific time period, but DO FOCUS ON HYDRATION when fasting o Multiple Techniques: - Time Restricted Eating: eat 3 meals in a day, each meal lasting no more than 60 minutes, no snacks between meals - 16-18 hour fast: fast for 16 to 18 hours up to 7 days a week. Often suggested to start with 2-3 nonconsecutive days per week.  . Remember the time you sleep is counted as fasting.  . Examples of eating schedule: Fast from 7:00pm-11:00am. Eat between 11:00am-7:00pm.  - 24-hour fast: fast for 24 hours up to every other day. Often suggested to start with 1 day per week . Remember the time you sleep is counted as fasting . Examples of eating schedule:  o Eating day: eat 2-3 meals on your eating day. If doing 2 meals, each meal should last no more than 90 minutes. If doing 3 meals, each meal should last no more than 60 minutes. Finish last meal by 7:00pm. o Fasting day: Fast until 7:00pm.  o IF YOU FEEL UNWELL FOR ANY REASON/IN ANY WAY WHEN FASTING, STOP FASTING BY EATING A NUTRITIOUS SNACK OR LIGHT MEAL o ALWAYS FOCUS ON HYDRATION DURING FASTS - Acceptable Hydration sources: water, broths, tea/coffee (black tea/coffee is best but using a small amount of whole-fat dairy products in coffee/tea is acceptable).  -   Poor Hydration Sources: anything with sugar or artificial sweeteners added to it  These recommendations have been developed for patients that are actively receiving medical care from either Dr. Leslyn Monda or Sarah Gray, DNP, NP-C at [facility]. These recommendations are developed for patients with specific medical conditions and are not meant to be  distributed or used by others that are not actively receiving care from either provider listed above at [facility]. It is not appropriate to participate in the above eating plans without proper medical supervision.   Reference: Fung, J. The obesity code. [place]: [place]; 2016.   

## 2020-09-29 NOTE — Addendum Note (Signed)
Addended by: Ronita Hipps on: 09/29/2020 04:26 PM   Modules accepted: Orders

## 2020-09-29 NOTE — Progress Notes (Signed)
Metrics: Intervention Frequency ACO  Documented Smoking Status Yearly  Screened one or more times in 24 months  Cessation Counseling or  Active cessation medication Past 24 months  Past 24 months   Guideline developer: UpToDate (See UpToDate for funding source) Date Released: 2014       Wellness Office Visit  Subjective:  Patient ID: Barbara Bennett, female    DOB: October 07, 1956  Age: 64 y.o. MRN: [id number]  CC: This lady comes in for follow-up of anemia, hypertension, hypothyroidism, diabetes and hyperlipidemia. HPI  I reviewed all the blood work that was done with Maralyn Sago and the patient has a hemoglobin A1c of 5.4% and she is only taking Metformin 500 mg twice a day for this. She continues on statin therapy for hyperlipidemia in the face of diabetes. She is also on levothyroxine for hypothyroidism and free T3 levels are in a suboptimal range.  TSH is elevated above 2.5. Past Medical History:  Diagnosis Date  . Anemia   . Anemia, unspecified   . Arthritis   . Bell's palsy   . Essential (primary) hypertension   . Hyperlipidemia, unspecified   . Hypothyroidism, unspecified   . Irritable bowel syndrome with diarrhea   . Obesity, unspecified   . Type 2 diabetes mellitus without complications [facility])    Past Surgical History:  Procedure Laterality Date  . CATARACT EXTRACTION W/PHACO Left 03/31/2016   Procedure: CATARACT EXTRACTION PHACO AND INTRAOCULAR LENS PLACEMENT (IOC);  Surgeon: Gemma Payor, MD;  Location: [facility];  Service: Ophthalmology;  Laterality: Left;  CDE: 6.45  . CATARACT EXTRACTION W/PHACO Right 04/28/2016   Procedure: CATARACT EXTRACTION PHACO AND INTRAOCULAR LENS PLACEMENT (IOC);  Surgeon: Gemma Payor, MD;  Location: [facility];  Service: Ophthalmology;  Laterality: Right;  CDE 5.00  . KNEE ARTHROSCOPY Left      Family History  Problem Relation Age of Onset  . Diabetes Mother   . Heart disease Mother   . Heart disease Father   . Diabetes Sister   . Renal Disease  Sister        Dialysis  . Asthma Sister   . Hypertension Sister   . Diabetes Sister   . Arthritis Sister   . Diabetes Sister   . Renal Disease Sister        Dialysis  . Hypertension Sister   . Migraines Daughter   . Pneumonia Son     Social History   Social History Narrative   Married for 26 years,second.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   Social History   Tobacco Use  . Smoking status: Never Smoker  . Smokeless tobacco: Never Used  Substance Use Topics  . Alcohol use: No    Current Meds  Medication Sig  . atorvastatin (LIPITOR) 10 MG tablet Take 1 tablet (10 mg total) by mouth daily.  . Cholecalciferol 1.25 MG (50000 UT) TABS Take 1 tablet by mouth daily.  . Cinnamon 500 MG capsule Take 1,000 mg by mouth daily.  . ferrous sulfate 325 (65 FE) MG tablet Take 325 mg by mouth daily with breakfast.  . ibuprofen (ADVIL,MOTRIN) 200 MG tablet Take 200 mg by mouth every 6 (six) hours as needed for moderate pain.  [name] irbesartan (AVAPRO) 75 MG tablet Take 1 tablet (75 mg total) by mouth daily.  [name] levothyroxine (SYNTHROID) 50 MCG tablet Take 1 tablet (50 mcg total) by mouth daily before breakfast.  . Menaquinone-7 (VITAMIN K2 PO) Take 1 tablet by mouth daily.  . metFORMIN (GLUCOPHAGE) 500 MG tablet  Take 1 tablet (500 mg total) by mouth 2 (two) times daily with a meal.  . Multiple Vitamin (MULTIVITAMIN WITH MINERALS) TABS tablet Take 1 tablet by mouth daily.  . NON FORMULARY Take 1 tablet by mouth daily. Quercetin      No flowsheet data found.   Objective:   Today's Vitals: BP 126/78   Pulse 83   Temp 97.7 F (36.5 C) (Temporal)   Ht 5' (1.524 m)   Wt 185 lb 9.6 oz (84.2 kg)   LMP 04/01/2012   SpO2 97%   BMI 36.25 kg/m  Vitals with BMI 09/29/2020 08/19/2020 04/03/2020  Height 5\' 0"  5\' 0"  -  Weight 185 lbs 10 oz 186 lbs -  BMI 36.25 36.33 -  Systolic 126 121  Diastolic 78 86 84  Pulse 83 73 99     Physical Exam  She looks systemically  well.  She is obese.  Blood pressure is in a good range.     Assessment   1. Anemia, unspecified type   2. Essential (primary) hypertension   3. Hypothyroidism, unspecified type   4. Type 2 diabetes mellitus without complication, unspecified whether long term insulin use (HCC)   5. Hyperlipidemia, unspecified hyperlipidemia type       Tests ordered No orders of the defined types were placed in this encounter.    Plan: 1. As far as the anemia is concerned, she does have an appointment with gastroenterology for further evaluation of what appears to be an iron deficient anemia.  I suspect she will need EGD and even colonoscopy. 2. Continue with the same dose of antihypertensive medication listed above. 3. As far as her hypothyroidism is concerned, I am going to switch her to NP thyroid to provide her T3 which is lacking.  I have given her samples but also a prescription has been sent.  Hopefully she will tolerate this and I have warned her of possible side effects. 4. As far as her diabetes is concerned, I think we can discontinue Metformin and she will do this providing she also improves her diet.  We discussed intermittent fasting combined with more of a plant-based diet and I have given her diet sheet. 5. She also describes hot flashes which are bothersome and week we will discuss this another time. 6. Follow-up in the next 6 weeks or so to see how she is doing and we will probably check blood work then.  High-dose influenza vaccination was given today. 7. Today I spent 40 minutes with this patient reviewing her blood work and making recommendations above.   Meds ordered this encounter  Medications  . NP THYROID 60 MG tablet    Sig: Take 1 tablet (60 mg total) by mouth daily before breakfast.    Dispense:  30 tablet    Refill:  3    Antonique Langford , MD

## 2020-11-03 ENCOUNTER — Encounter: Payer: Self-pay | Admitting: Gastroenterology

## 2020-11-03 ENCOUNTER — Ambulatory Visit (INDEPENDENT_AMBULATORY_CARE_PROVIDER_SITE_OTHER): Payer: Medicare Other | Admitting: Gastroenterology

## 2020-11-03 ENCOUNTER — Encounter: Payer: Self-pay | Admitting: *Deleted

## 2020-11-03 ENCOUNTER — Other Ambulatory Visit: Payer: Self-pay

## 2020-11-03 VITALS — BP 141/74 | HR 69 | Temp 97.1°F | Ht 60.0 in | Wt 188.8 lb

## 2020-11-03 DIAGNOSIS — D508 Other iron deficiency anemias: Secondary | ICD-10-CM

## 2020-11-03 MED ORDER — NA SULFATE-K SULFATE-MG SULF 17.5-3.13-1.6 GM/177ML PO SOLN: 1.0000 | Freq: Once | ORAL | 0 refills | Status: AC

## 2020-11-03 NOTE — Patient Instructions (Signed)
1. Colonoscopy in the near future. See separate instructions.  

## 2020-11-03 NOTE — Progress Notes (Signed)
Primary Care Physician:  Wilson Singer, MD  Primary Gastroenterologist:  Roetta Sessions, MD   Chief Complaint  Patient presents with  . Anemia    never had tcs, hasn't seen any blood in stool or dark stool    HPI:  Barbara Bennett is a 64 y.o. female here at the request of Dr. Karilyn Cota for further evaluation of anemia.   Patient states she has been anemic her whole life. Runs in the family. Denies FH of sickle cell disease, sickle cell trait, Thalassemia. 11 years ago she had a blood transfusion.  Last menstrual cycle more than 10 years ago.    She denies any nosebleeds, gross hematuria, blood in the stool or melena.  No constipation or diarrhea.  No heartburn.  No nausea or vomiting.  Appetite is good.  No unintentional weight loss.  Occasionally consumes ibuprofen.  No prior colonoscopy   Labs back in September with hemoglobin of 10.7.  September 09, 2020, hemoglobin 10.4, hematocrit 32.1, MCV 84.7, ferritin 291, iron 36, TIBC 275, iron saturation is 13%.  In 2017 her hemoglobin was normal at 12.2.  Current Outpatient Medications  Medication Sig Dispense Refill  . atorvastatin (LIPITOR) 10 MG tablet Take 1 tablet (10 mg total) by mouth daily. 90 tablet 0  . Cholecalciferol 1.25 MG (50000 UT) TABS Take 1 tablet by mouth daily.    . Cinnamon 500 MG capsule Take 1,000 mg by mouth daily.    . ferrous sulfate 325 (65 FE) MG tablet Take 325 mg by mouth daily with breakfast.    . ibuprofen (ADVIL,MOTRIN) 200 MG tablet Take 200 mg by mouth every 6 (six) hours as needed for moderate pain.    [name] irbesartan (AVAPRO) 75 MG tablet Take 1 tablet (75 mg total) by mouth daily. 90 tablet 0  . Menaquinone-7 (VITAMIN K2 PO) Take 1 tablet by mouth daily.    . Multiple Vitamin (MULTIVITAMIN WITH MINERALS) TABS tablet Take 1 tablet by mouth daily.    . NON FORMULARY Take 1 tablet by mouth daily. Quercetin    . NP THYROID 60 MG tablet Take 1 tablet (60 mg total) by mouth daily before breakfast. 30 tablet 3    No current facility-administered medications for this visit.    Allergies as of 11/03/2020 - Review Complete 11/03/2020  Allergen Reaction Noted  . Penicillins Rash 03/22/2016  . Sulfa antibiotics Rash 03/25/2012    Past Medical History:  Diagnosis Date  . Anemia   . Anemia, unspecified   . Arthritis   . Bell's palsy   . Essential (primary) hypertension   . Hyperlipidemia, unspecified   . Hypothyroidism, unspecified   . Irritable bowel syndrome with diarrhea   . Obesity, unspecified   . Type 2 diabetes mellitus without complications [facility])     Past Surgical History:  Procedure Laterality Date  . CATARACT EXTRACTION W/PHACO Left 03/31/2016   Procedure: CATARACT EXTRACTION PHACO AND INTRAOCULAR LENS PLACEMENT (IOC);  Surgeon: Gemma Payor, MD;  Location: [facility];  Service: Ophthalmology;  Laterality: Left;  CDE: 6.45  . CATARACT EXTRACTION W/PHACO Right 04/28/2016   Procedure: CATARACT EXTRACTION PHACO AND INTRAOCULAR LENS PLACEMENT (IOC);  Surgeon: Gemma Payor, MD;  Location: [facility];  Service: Ophthalmology;  Laterality: Right;  CDE 5.00  . KNEE ARTHROSCOPY Left     Family History  Problem Relation Age of Onset  . Diabetes Mother   . Heart disease Mother   . Heart disease Father   . Diabetes Sister   .  Renal Disease Sister        Dialysis  . Asthma Sister   . Hypertension Sister   . Diabetes Sister   . Arthritis Sister   . Diabetes Sister   . Renal Disease Sister        Dialysis  . Hypertension Sister   . Migraines Daughter   . Pneumonia Son   . Colon cancer Neg Hx   . Sickle cell anemia Neg Hx   . Thalassemia Neg Hx   . Leukemia Neg Hx     Social History   Socioeconomic History  . Marital status: Married    Spouse name: Not on file  . Number of children: Not on file  . Years of education: 20  . Highest education level: Not on file  Occupational History  . Occupation: Merchandiser, retail at [employer]  . Smoking status: Never Smoker  . Smokeless  tobacco: Never Used  Vaping Use  . Vaping Use: Never used  Substance and Sexual Activity  . Alcohol use: No  . Drug use: No  . Sexual activity: Never  Other Topics Concern  . Not on file  Social History Narrative   Married for 26 years,second.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   Social Determinants of Health   Financial Resource Strain:   . Difficulty of Paying Living Expenses: Not on file  Food Insecurity:   . Worried About Programme researcher, broadcasting/film/video in the Last Year: Not on file  . Ran Out of Food in the Last Year: Not on file  Transportation Needs:   . Lack of Transportation (Medical): Not on file  . Lack of Transportation (Non-Medical): Not on file  Physical Activity:   . Days of Exercise per Week: Not on file  . Minutes of Exercise per Session: Not on file  Stress:   . Feeling of Stress : Not on file  Social Connections:   . Frequency of Communication with Friends and Family: Not on file  . Frequency of Social Gatherings with Friends and Family: Not on file  . Attends Religious Services: Not on file  . Active Member of Clubs or Organizations: Not on file  . Attends Banker Meetings: Not on file  . Marital Status: Not on file  Intimate Partner Violence:   . Fear of Current or Ex-Partner: Not on file  . Emotionally Abused: Not on file  . Physically Abused: Not on file  . Sexually Abused: Not on file      ROS:  General: Negative for anorexia, weight loss, fever, chills, fatigue, weakness. Eyes: Negative for vision changes.  ENT: Negative for hoarseness, difficulty swallowing , nasal congestion. CV: Negative for chest pain, angina, palpitations, dyspnea on exertion, peripheral edema.  Respiratory: Negative for dyspnea at rest, dyspnea on exertion, cough, sputum, wheezing.  GI: See history of present illness. GU:  Negative for dysuria, hematuria, urinary incontinence, urinary frequency, nocturnal urination.  MS: Negative for joint pain, low  back pain.  Derm: Negative for rash or itching.  Neuro: Negative for weakness, abnormal sensation, seizure, frequent headaches, memory loss, confusion.  Psych: Negative for anxiety, depression, suicidal ideation, hallucinations.  Endo: Negative for unusual weight change.  Heme: Negative for bruising or bleeding. Allergy: Negative for rash or hives.    Physical Examination:  BP (!) 141/74   Pulse 69   Temp (!) 97.1 F (36.2 C) (Temporal)   Ht 5' (1.524 m)   Wt 188 lb 12.8 oz (85.6 kg)  LMP 04/01/2012   BMI 36.87 kg/m    General: Well-nourished, well-developed in no acute distress.  Head: Normocephalic, atraumatic.   Eyes: Conjunctiva pink, no icterus. Mouth: masked Neck: Supple without thyromegaly, masses, or lymphadenopathy.  Lungs: Clear to auscultation bilaterally.  Heart: Regular rate and rhythm, no murmurs rubs or gallops.  Abdomen: Bowel sounds are normal, nontender, nondistended, no hepatosplenomegaly or masses, no abdominal bruits or    hernia , no rebound or guarding.   Rectal: Not performed Extremities: No lower extremity edema. No clubbing or deformities.  Neuro: Alert and oriented x 4 , grossly normal neurologically.  Skin: Warm and dry, no rash or jaundice.   Psych: Alert and cooperative, normal mood and affect.  Labs: Lab Results  Component Value Date   CREATININE 1.07 (H) 08/19/2020   BUN 18 08/19/2020   NA 141 08/19/2020   K 4.8 08/19/2020   CL 107 08/19/2020   CO2 27 08/19/2020   Lab Results  Component Value Date   ALT 10 08/19/2020   AST 17 08/19/2020   BILITOT 0.5 08/19/2020   Lab Results  Component Value Date   WBC 6.4 09/09/2020   HGB 10.4 (L) 09/09/2020   HCT 32.1 (L) 09/09/2020   MCV 84.7 09/09/2020   PLT 159 09/09/2020   Lab Results  Component Value Date   IRON 36 (L) 09/09/2020   TIBC 275 09/09/2020   FERRITIN 291 (H) 09/09/2020   Lab Results  Component Value Date   HGBA1C 5.4 08/19/2020     Imaging Studies: No results  found.  Impression/plan:  64 year old female presenting for further evaluation of anemia.  No prior colonoscopy.  Iron and iron saturations low but normal TIBC and elevated ferritin.  May have mixed anemia with some iron deficiency.  Pathologist smear review showed RBCs to be microcytic and hypochromic suggestive of IDA.  Recommend colonoscopy in the near future.  Patient is appropriate for conscious sedation. ASA II.  I have discussed the risks, alternatives, benefits with regards to but not limited to the risk of reaction to medication, bleeding, infection, perforation and the patient is agreeable to proceed. Written consent to be obtained.  We will update her CBC next month.

## 2020-11-04 ENCOUNTER — Encounter (INDEPENDENT_AMBULATORY_CARE_PROVIDER_SITE_OTHER): Payer: Self-pay | Admitting: Internal Medicine

## 2020-11-04 ENCOUNTER — Ambulatory Visit (INDEPENDENT_AMBULATORY_CARE_PROVIDER_SITE_OTHER): Payer: Medicare Other | Admitting: Internal Medicine

## 2020-11-04 VITALS — BP 133/70 | HR 89 | Temp 97.5°F | Resp 18 | Ht 60.0 in | Wt 184.0 lb

## 2020-11-04 DIAGNOSIS — E039 Hypothyroidism, unspecified: Secondary | ICD-10-CM | POA: Diagnosis not present

## 2020-11-04 DIAGNOSIS — H00011 Hordeolum externum right upper eyelid: Secondary | ICD-10-CM | POA: Diagnosis not present

## 2020-11-04 NOTE — Progress Notes (Signed)
Metrics: Intervention Frequency ACO  Documented Smoking Status Yearly  Screened one or more times in 24 months  Cessation Counseling or  Active cessation medication Past 24 months  Past 24 months   Guideline developer: UpToDate (See UpToDate for funding source) Date Released: 2014       Wellness Office Visit  Subjective:  Patient ID: Barbara Bennett, female    DOB: [birth year]  Age: 64 y.o. MRN: [id number]  CC: This lady comes in for follow-up of hypothyroidism. HPI  She is tolerated the NP thyroid well and actually feels better on this medication than the levothyroxine. She also is complaining of a right stye.  In the past, she has used some kind of cream that has helped her but she cannot remember the name. Past Medical History:  Diagnosis Date  . Anemia   . Anemia, unspecified   . Arthritis   . Bell's palsy   . Essential (primary) hypertension   . Hyperlipidemia, unspecified   . Hypothyroidism, unspecified   . Irritable bowel syndrome with diarrhea   . Obesity, unspecified   . Type 2 diabetes mellitus without complications (HCC)    no longer on medications, previously on metformin for borderline DM, A1C 5.6   Past Surgical History:  Procedure Laterality Date  . CATARACT EXTRACTION W/PHACO Left 03/31/2016   Procedure: CATARACT EXTRACTION PHACO AND INTRAOCULAR LENS PLACEMENT (IOC);  Surgeon: Gemma Payor, MD;  Location: [facility];  Service: Ophthalmology;  Laterality: Left;  CDE: 6.45  . CATARACT EXTRACTION W/PHACO Right 04/28/2016   Procedure: CATARACT EXTRACTION PHACO AND INTRAOCULAR LENS PLACEMENT (IOC);  Surgeon: Gemma Payor, MD;  Location: [facility];  Service: Ophthalmology;  Laterality: Right;  CDE 5.00  . KNEE ARTHROSCOPY Left      Family History  Problem Relation Age of Onset  . Diabetes Mother   . Heart disease Mother   . Heart disease Father   . Diabetes Sister   . Renal Disease Sister        Dialysis  . Asthma Sister   . Hypertension Sister   . Diabetes Sister     . Arthritis Sister   . Diabetes Sister   . Renal Disease Sister        Dialysis  . Hypertension Sister   . Migraines Daughter   . Pneumonia Son   . Colon cancer Neg Hx   . Sickle cell anemia Neg Hx   . Thalassemia Neg Hx   . 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   Social History   Tobacco Use  . Smoking status: Never Smoker  . Smokeless tobacco: Never Used  Substance Use Topics  . Alcohol use: No    Current Meds  Medication Sig  . atorvastatin (LIPITOR) 10 MG tablet Take 1 tablet (10 mg total) by mouth daily.  . Cholecalciferol 1.25 MG (50000 UT) TABS Take 1 tablet by mouth daily.  . Cinnamon 500 MG capsule Take 1,000 mg by mouth daily.  . ferrous sulfate 325 (65 FE) MG tablet Take 325 mg by mouth daily with breakfast.  . ibuprofen (ADVIL,MOTRIN) 200 MG tablet Take 200 mg by mouth every 6 (six) hours as needed for moderate pain.  [name] irbesartan (AVAPRO) 75 MG tablet Take 1 tablet (75 mg total) by mouth daily.  . Menaquinone-7 (VITAMIN K2 PO) Take 1 tablet by mouth daily.  . Multiple Vitamin (MULTIVITAMIN WITH MINERALS) TABS tablet  Take 1 tablet by mouth daily.  . NON FORMULARY Take 1 tablet by mouth daily. Quercetin  . NP THYROID 60 MG tablet Take 1 tablet (60 mg total) by mouth daily before breakfast.      Depression screen [facility] 2/9 11/04/2020  Decreased Interest 0  Down, Depressed, Hopeless 0  PHQ - 2 Score 0     Objective:   Today's Vitals: BP 133/70 (BP Location: Right Arm, Patient Position: Sitting, Cuff Size: Normal)   Pulse 89   Temp (!) 97.5 F (36.4 C) (Temporal)   Resp 18   Ht 5' (1.524 m)   Wt 184 lb (83.5 kg)   LMP 04/01/2012   SpO2 98%   BMI 35.94 kg/m  Vitals with BMI 11/04/2020 11/03/2020 09/29/2020  Height 5\' 0"  5\' 0"  5\' 0"   Weight 184 lbs 188 lbs 13 oz 185 lbs 10 oz  BMI 35.94 36.87 36.25  Systolic 133 141  Diastolic 70 74 78   Pulse 89 69 83     Physical Exam   She looks systemically well and is lost about a pound since the last visit.  Blood pressure is in good range.  She does have a stye of the right eye.    Assessment   1. Hypothyroidism, unspecified type   2. Hordeolum externum of right upper eyelid       Tests ordered Orders Placed This Encounter  Procedures  . T3, free  . TSH     Plan: 1. She will continue with NP thyroid 60 mg daily and we will check thyroid function test today.  We may need to escalate the dose further to optimize this. 2. She will call back and let me know what cream that she used previously for the stye that had helped her and we will prescribe it. 3. Follow-up in 2 months.   No orders of the defined types were placed in this encounter.   , MD

## 2020-11-05 LAB — T3, FREE: T3, Free: 3.8 pg/mL (ref 2.3–4.2)

## 2020-11-05 LAB — TSH: TSH: 5.58 mIU/L — ABNORMAL HIGH (ref 0.40–4.50)

## 2020-11-06 ENCOUNTER — Encounter: Payer: Self-pay | Admitting: Gastroenterology

## 2020-11-06 ENCOUNTER — Telehealth: Payer: Self-pay | Admitting: Gastroenterology

## 2020-11-06 NOTE — Telephone Encounter (Signed)
Patient needs labs next month to include CBC, B12, folate.  Diagnosis anemia.

## 2020-11-09 ENCOUNTER — Other Ambulatory Visit: Payer: Self-pay

## 2020-11-09 DIAGNOSIS — Z79899 Other long term (current) drug therapy: Secondary | ICD-10-CM

## 2020-11-09 DIAGNOSIS — D649 Anemia, unspecified: Secondary | ICD-10-CM

## 2020-11-09 NOTE — Progress Notes (Signed)
Cc'ed to pcp °

## 2020-11-09 NOTE — Telephone Encounter (Signed)
Noted. Lab orders placed as directed.

## 2020-11-10 ENCOUNTER — Other Ambulatory Visit (INDEPENDENT_AMBULATORY_CARE_PROVIDER_SITE_OTHER): Payer: Self-pay | Admitting: Internal Medicine

## 2020-11-10 MED ORDER — NP THYROID 90 MG PO TABS: 90.0000 mg | ORAL_TABLET | Freq: Every day | ORAL | 3 refills | Status: DC

## 2020-11-16 ENCOUNTER — Other Ambulatory Visit: Payer: Self-pay

## 2020-11-16 DIAGNOSIS — Z79899 Other long term (current) drug therapy: Secondary | ICD-10-CM

## 2020-11-16 DIAGNOSIS — D649 Anemia, unspecified: Secondary | ICD-10-CM

## 2020-11-30 ENCOUNTER — Telehealth (INDEPENDENT_AMBULATORY_CARE_PROVIDER_SITE_OTHER): Payer: Self-pay

## 2020-11-30 NOTE — Telephone Encounter (Signed)
Patient called and left a voice message requesting the following medications refills to be sent to [facility] for the patient:  atorvastatin (LIPITOR) 10 MG tablet  Last filled 08/20/2020, # 90 with 0 refills  irbesartan (AVAPRO) 75 MG tablet  Last filled 08/19/2020, # 90 with 0 refills  Last OV 11/04/2020  Next OV 01/05/2021

## 2020-12-01 ENCOUNTER — Other Ambulatory Visit (INDEPENDENT_AMBULATORY_CARE_PROVIDER_SITE_OTHER): Payer: Self-pay | Admitting: Internal Medicine

## 2020-12-01 DIAGNOSIS — I10 Essential (primary) hypertension: Secondary | ICD-10-CM

## 2020-12-01 DIAGNOSIS — E119 Type 2 diabetes mellitus without complications: Secondary | ICD-10-CM

## 2020-12-01 DIAGNOSIS — E785 Hyperlipidemia, unspecified: Secondary | ICD-10-CM

## 2020-12-01 MED ORDER — IRBESARTAN 75 MG PO TABS: 75.0000 mg | ORAL_TABLET | Freq: Every day | ORAL | 0 refills | Status: DC

## 2020-12-01 MED ORDER — ATORVASTATIN CALCIUM 10 MG PO TABS: 10.0000 mg | ORAL_TABLET | Freq: Every day | ORAL | 0 refills | Status: DC

## 2020-12-11 LAB — CBC WITH DIFFERENTIAL/PLATELET
Absolute Monocytes: 439 cells/uL (ref 200–950)
Basophils Absolute: 41 cells/uL (ref 0–200)
Basophils Relative: 0.8 %
Eosinophils Absolute: 148 cells/uL (ref 15–500)
Eosinophils Relative: 2.9 %
HCT: 32.1 % — ABNORMAL LOW (ref 35.0–45.0)
Hemoglobin: 10.5 g/dL — ABNORMAL LOW (ref 11.7–15.5)
Lymphs Abs: 1943 cells/uL (ref 850–3900)
MCH: 26.9 pg — ABNORMAL LOW (ref 27.0–33.0)
MCHC: 32.7 g/dL (ref 32.0–36.0)
MCV: 82.1 fL (ref 80.0–100.0)
MPV: 10.3 fL (ref 7.5–12.5)
Monocytes Relative: 8.6 %
Neutro Abs: 2530 cells/uL (ref 1500–7800)
Neutrophils Relative %: 49.6 %
Platelets: 181 10*3/uL (ref 140–400)
RBC: 3.91 10*6/uL (ref 3.80–5.10)
RDW: 12.4 % (ref 11.0–15.0)
Total Lymphocyte: 38.1 %
WBC: 5.1 10*3/uL (ref 3.8–10.8)

## 2020-12-11 LAB — B12 AND FOLATE PANEL
Folate: >24 ng/mL
Vitamin B-12: 582 pg/mL (ref 200–1100)

## 2020-12-29 ENCOUNTER — Other Ambulatory Visit: Payer: Self-pay

## 2020-12-29 ENCOUNTER — Other Ambulatory Visit (HOSPITAL_COMMUNITY)
Admission: RE | Admit: 2020-12-29 | Discharge: 2020-12-29 | Disposition: A | Discharge status: Home / Self Care | Payer: Medicare Other | Source: Ambulatory Visit | Attending: Internal Medicine | Admitting: Internal Medicine

## 2020-12-29 DIAGNOSIS — Z01812 Encounter for preprocedural laboratory examination: Secondary | ICD-10-CM | POA: Insufficient documentation

## 2020-12-29 DIAGNOSIS — Z20822 Contact with and (suspected) exposure to covid-19: Secondary | ICD-10-CM | POA: Diagnosis not present

## 2020-12-29 LAB — SARS CORONAVIRUS 2 (TAT 6-24 HRS): SARS Coronavirus 2: NEGATIVE

## 2020-12-30 ENCOUNTER — Telehealth: Payer: Self-pay | Admitting: Internal Medicine

## 2020-12-30 ENCOUNTER — Ambulatory Visit (HOSPITAL_COMMUNITY)
Admission: RE | Admit: 2020-12-30 | Discharge: 2020-12-30 | Disposition: A | Discharge status: Home / Self Care | Payer: Medicare Other | Attending: Internal Medicine | Admitting: Internal Medicine

## 2020-12-30 ENCOUNTER — Other Ambulatory Visit: Payer: Self-pay

## 2020-12-30 ENCOUNTER — Encounter (HOSPITAL_COMMUNITY): Payer: Self-pay | Admitting: Internal Medicine

## 2020-12-30 ENCOUNTER — Encounter (HOSPITAL_COMMUNITY)
Admission: RE | Disposition: A | Discharge status: Home / Self Care | Payer: Self-pay | Source: Home / Self Care | Attending: Internal Medicine

## 2020-12-30 DIAGNOSIS — Z1211 Encounter for screening for malignant neoplasm of colon: Secondary | ICD-10-CM | POA: Diagnosis present

## 2020-12-30 DIAGNOSIS — R933 Abnormal findings on diagnostic imaging of other parts of digestive tract: Secondary | ICD-10-CM

## 2020-12-30 DIAGNOSIS — Z79890 Hormone replacement therapy (postmenopausal): Secondary | ICD-10-CM | POA: Diagnosis not present

## 2020-12-30 DIAGNOSIS — Z880 Allergy status to penicillin: Secondary | ICD-10-CM | POA: Diagnosis not present

## 2020-12-30 DIAGNOSIS — K5730 Diverticulosis of large intestine without perforation or abscess without bleeding: Secondary | ICD-10-CM | POA: Insufficient documentation

## 2020-12-30 DIAGNOSIS — Z79899 Other long term (current) drug therapy: Secondary | ICD-10-CM | POA: Insufficient documentation

## 2020-12-30 DIAGNOSIS — Z882 Allergy status to sulfonamides status: Secondary | ICD-10-CM | POA: Diagnosis not present

## 2020-12-30 HISTORY — PX: COLONOSCOPY: SHX5424

## 2020-12-30 SURGERY — COLONOSCOPY
Anesthesia: Moderate Sedation

## 2020-12-30 MED ORDER — ONDANSETRON HCL 4 MG/2ML IJ SOLN
INTRAMUSCULAR | Status: DC | PRN
  Administered 2020-12-30: 4 mg via INTRAVENOUS

## 2020-12-30 MED ORDER — MEPERIDINE HCL 100 MG/ML IJ SOLN
INTRAMUSCULAR | Status: DC | PRN
  Administered 2020-12-30: 10 mg via INTRAVENOUS
  Administered 2020-12-30: 40 mg via INTRAVENOUS

## 2020-12-30 MED ORDER — MEPERIDINE HCL 50 MG/ML IJ SOLN
INTRAMUSCULAR | Status: AC
  Filled 2020-12-30: qty 1

## 2020-12-30 MED ORDER — MIDAZOLAM HCL 5 MG/5ML IJ SOLN
INTRAMUSCULAR | Status: AC
  Filled 2020-12-30: qty 10

## 2020-12-30 MED ORDER — SODIUM CHLORIDE 0.9 % IV SOLN: INTRAVENOUS | Status: DC

## 2020-12-30 MED ORDER — MIDAZOLAM HCL 5 MG/5ML IJ SOLN
INTRAMUSCULAR | Status: DC | PRN
  Administered 2020-12-30: 2 mg via INTRAVENOUS
  Administered 2020-12-30: 1 mg via INTRAVENOUS

## 2020-12-30 MED ORDER — STERILE WATER FOR IRRIGATION IR SOLN
Status: DC | PRN
  Administered 2020-12-30: 100 mL

## 2020-12-30 MED ORDER — ONDANSETRON HCL 4 MG/2ML IJ SOLN
INTRAMUSCULAR | Status: AC
  Filled 2020-12-30: qty 2

## 2020-12-30 NOTE — H&P (Signed)
_0 @   Primary Care Physician:  Doree Albee, MD Primary Gastroenterologist:  Dr. Gala Romney  Pre-Procedure History & Physical: HPI:  Barbara Bennett is a 65 y.o. female is here for a screening colonoscopy.  No prior colonoscopy.  Hemoccult negative.  She has an anemia but iron studies do not show iron deficiency.  Past Medical History:  Diagnosis Date  . Anemia   . Anemia, unspecified   . Arthritis   . Bell's palsy   . Essential (primary) hypertension   . Hyperlipidemia, unspecified   . Hypothyroidism, unspecified   . Irritable bowel syndrome with diarrhea   . Obesity, unspecified   . Type 2 diabetes mellitus without complications (HCC)    no longer on medications, previously on metformin for borderline DM, A1C 5.6    Past Surgical History:  Procedure Laterality Date  . CATARACT EXTRACTION W/PHACO Left 03/31/2016   Procedure: CATARACT EXTRACTION PHACO AND INTRAOCULAR LENS PLACEMENT (IOC);  Surgeon: Tonny Branch, MD;  Location: [facility];  Service: Ophthalmology;  Laterality: Left;  CDE: 6.45  . CATARACT EXTRACTION W/PHACO Right 04/28/2016   Procedure: CATARACT EXTRACTION PHACO AND INTRAOCULAR LENS PLACEMENT (IOC);  Surgeon: Tonny Branch, MD;  Location: [facility];  Service: Ophthalmology;  Laterality: Right;  CDE 5.00  . KNEE ARTHROSCOPY Left     Prior to Admission medications   Medication Sig Start Date End Date Taking? Authorizing Provider  atorvastatin (LIPITOR) 10 MG tablet Take 1 tablet (10 mg total) by mouth daily. 12/01/20  Yes Gosrani, Nimish C, MD  Cinnamon 500 MG capsule Take 1,000 mg by mouth daily.   Yes [provider]  ferrous sulfate 325 (65 FE) MG tablet Take 325 mg by mouth daily with breakfast.   Yes [provider]  irbesartan (AVAPRO) 75 MG tablet Take 1 tablet (75 mg total) by mouth daily. 12/01/20  Yes Doree Albee, MD  Multiple Vitamin (MULTIVITAMIN WITH MINERALS) TABS tablet Take 1 tablet by mouth daily.   Yes [provider]   NP THYROID 90 MG tablet Take 1 tablet (90 mg total) by mouth daily. 11/10/20  Yes Gosrani, Nimish C, MD  ibuprofen (ADVIL,MOTRIN) 200 MG tablet Take 200-400 mg by mouth every 6 (six) hours as needed for moderate pain.    [provider]  SUPREP BOWEL PREP KIT 17.5-3.13-1.6 GM/177ML SOLN Take 354 mLs by mouth as directed. 11/04/20   [provider]    Allergies as of 11/03/2020 - Review Complete 11/03/2020  Allergen Reaction Noted  . Penicillins Rash 03/22/2016  . Sulfa antibiotics Rash 03/25/2012    Family History  Problem Relation Age of Onset  . Diabetes Mother   . Heart disease Mother   . Heart disease Father   . Diabetes Sister   . Renal Disease Sister        Dialysis  . Asthma Sister   . Hypertension Sister   . Diabetes Sister   . Arthritis Sister   . Diabetes Sister   . Renal Disease Sister        Dialysis  . Hypertension Sister   . Migraines Daughter   . Pneumonia Son   . Colon cancer Neg Hx   . Sickle cell anemia Neg Hx   . Thalassemia Neg Hx   . Leukemia Neg Hx     Social History   Socioeconomic History  . Marital status: Married    Spouse name: Not on file  . Number of children: Not on file  . Years of  education: 35  . Highest education level: Not on file  Occupational History  . Occupation: Librarian, academic at [employer]  . Smoking status: Never Smoker  . Smokeless tobacco: Never Used  Vaping Use  . Vaping Use: Never used  Substance and Sexual Activity  . Alcohol use: No  . Drug use: No  . Sexual activity: Never  Other Topics Concern  . Not on file  Social History Narrative   Married for 1 years,second.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   Social Determinants of Health   Financial Resource Strain: Not on file  Food Insecurity: Not on file  Transportation Needs: Not on file  Physical Activity: Not on file  Stress: Not on file  Social Connections: Not on file  Intimate Partner Violence: Not on  file    Review of Systems: See HPI, otherwise negative ROS  Physical Exam: BP 117/88   Pulse 90   Temp 98.1 F (36.7 C) (Oral)   Resp (!) 22   Ht 5' (1.524 m)   Wt 83.5 kg   LMP 04/01/2012   SpO2 100%   BMI 35.94 kg/m  General:   Alert,  Well-developed, well-nourished, pleasant and cooperative in NAD Lungs:  Clear throughout to auscultation.   No wheezes, crackles, or rhonchi. No acute distress. Heart:  Regular rate and rhythm; no murmurs, clicks, rubs,  or gallops. Abdomen:  Soft, nontender and nondistended. No masses, hepatosplenomegaly or hernias noted. Normal bowel sounds, without guarding, and without rebound.   Impression/Plan: [name] is now here to undergo a screening colonoscopy.  Risks, benefits, limitations, imponderables and alternatives regarding colonoscopy have been reviewed with the patient. Questions have been answered. All parties agreeable.     Notice:  This dictation was prepared with Dragon dictation along with smaller phrase technology. Any transcriptional errors that result from this process are unintentional and may not be corrected upon review.

## 2020-12-30 NOTE — Telephone Encounter (Signed)
Called central scheduling and CT scheduled for 1/27 at 12:30pm, arrival 12:15pm, npo 4 hrs prior, p/u oral contrast today.  Called pt. And she is aware of instructions. She voiced understanding and had no questions.

## 2020-12-30 NOTE — Op Note (Signed)
[facility] Patient Name: Barbara Bennett Procedure Date: 12/30/2020 9:12 AM MRN: [id number] Date of Birth: 01-23-1956 Attending MD: Gennette Pac , MD CSN: [id number] Age: 65 Admit Type: Outpatient Procedure:                Colonoscopy Indications:              Screening for colorectal malignant neoplasm Providers:                Gennette Pac, MD, Edrick Kins, RN, Edythe Clarity, Technician Referring MD:              Medicines:                Midazolam 3 mg IV, Meperidine 50 mg IV Complications:            No immediate complications. Estimated Blood Loss:     Estimated blood loss: none. Procedure:                Pre-Anesthesia Assessment:                           - Prior to the procedure, a History and Physical                            was performed, and patient medications and                            allergies were reviewed. The patient's tolerance of                            previous anesthesia was also reviewed. The risks                            and benefits of the procedure and the sedation                            options and risks were discussed with the patient.                            All questions were answered, and informed consent                            was obtained. Prior Anticoagulants: The patient has                            taken no previous anticoagulant or antiplatelet                            agents. ASA Grade Assessment: II - A patient with                            mild systemic disease. After reviewing the risks  and benefits, the patient was deemed in                            satisfactory condition to undergo the procedure.                           After obtaining informed consent, the colonoscope                            was passed under direct vision. Throughout the                            procedure, the patient's blood pressure, pulse, and                             oxygen saturations were monitored continuously. The                            CF-[id number] ([id number]) scope was introduced through                            the anus and advanced to the the terminal ileum.                            The colonoscopy was performed without difficulty.                            The patient tolerated the procedure well. The                            quality of the bowel preparation was adequate. Scope In: 9:22:31 AM Scope Out: 9:37:35 AM Scope Withdrawal Time: 0 hours 8 minutes 55 seconds  Total Procedure Duration: 0 hours 15 minutes 4 seconds  Findings:      The perianal and digital rectal examinations were normal.      Scattered small-mouthed diverticula were found in the sigmoid colon and       descending colon. 2 cm discrete area of extrinsic compression on the       cecum all a couple centimeters away from the appendiceal orifice as       depicted in the attached photograph. This was submucosal. The remainder       the colonic mucosa appeared normal. Normal-appearing retroflexion.       Distal 5 cm of terminal ileum appeared normal. Impression:               - Diverticulosis in the sigmoid colon and in the                            descending colon. Normal terminal ileum. Peculiar 2                            cm extrinsic compression in the cecum as described                           - No specimens  collected. Moderate Sedation:      Moderate (conscious) sedation was administered by the endoscopy nurse       and supervised by the endoscopist. The following parameters were       monitored: oxygen saturation, heart rate, blood pressure, respiratory       rate, EKG, adequacy of pulmonary ventilation, and response to care.       Total physician intraservice time was 18 minutes. Recommendation:           - Patient has a contact number available for                            emergencies. The signs and symptoms of potential                             delayed complications were discussed with the                            patient. Return to normal activities tomorrow.                            Written discharge instructions were provided to the                            patient.                           - Advance diet as tolerated.                           - Continue present medications.                           - Repeat colonoscopy in 10 years for screening                            purposes if overall health permits.                           - Return to GI office (date not yet determined).                            Contrast CT of the abdomen pelvis. Further                            recommendations to follow. Procedure Code(s):        --- Professional ---                           [id number], Colonoscopy, flexible; diagnostic, including                            collection of specimen(s) by brushing or washing,                            when performed (separate procedure)  G0500, Moderate sedation services provided by the                            same physician or other qualified health care                            professional performing a gastrointestinal                            endoscopic service that sedation supports,                            requiring the presence of an independent trained                            observer to assist in the monitoring of the                            patient's level of consciousness and physiological                            status; initial 15 minutes of intra-service time;                            patient age 54 years or older (additional time may                            be reported with 46659, as appropriate) Diagnosis Code(s):        --- Professional ---                           Z12.11, Encounter for screening for malignant                            neoplasm of colon                           K57.30, Diverticulosis of large intestine  without                            perforation or abscess without bleeding CPT copyright 2019 American Medical Association. All rights reserved. The codes documented in this report are preliminary and upon coder review may  be revised to meet current compliance requirements. Gerrit Friends. Rourk, MD Gennette Pac, MD 12/30/2020 9:49:02 AM This report has been signed electronically. Number of Addenda: 0

## 2020-12-30 NOTE — Telephone Encounter (Signed)
PER D/C INSTRUCTIONS "contrast CT of the abdomen pelvis to evaluate further (extrinsic mass effect on cecum)."

## 2020-12-30 NOTE — Discharge Instructions (Signed)
Colonoscopy Discharge Instructions  Read the instructions outlined below and refer to this sheet in the next few weeks. These discharge instructions provide you with general information on caring for yourself after you leave the hospital. Your doctor may also give you specific instructions. While your treatment has been planned according to the most current medical practices available, unavoidable complications occasionally occur. If you have any problems or questions after discharge, call Dr. Jena Gauss at [phone]. ACTIVITY  You may resume your regular activity, but move at a slower pace for the next 24 hours.   Take frequent rest periods for the next 24 hours.   Walking will help get rid of the air and reduce the bloated feeling in your belly (abdomen).   No driving for 24 hours (because of the medicine (anesthesia) used during the test).    Do not sign any important legal documents or operate any machinery for 24 hours (because of the anesthesia used during the test).  NUTRITION  Drink plenty of fluids.   You may resume your normal diet as instructed by your doctor.   Begin with a light meal and progress to your normal diet. Heavy or fried foods are harder to digest and may make you feel sick to your stomach (nauseated).   Avoid alcoholic beverages for 24 hours or as instructed.  MEDICATIONS  You may resume your normal medications unless your doctor tells you otherwise.  WHAT YOU CAN EXPECT TODAY  Some feelings of bloating in the abdomen.   Passage of more gas than usual.   Spotting of blood in your stool or on the toilet paper.  IF YOU HAD POLYPS REMOVED DURING THE COLONOSCOPY:  No aspirin products for 7 days or as instructed.   No alcohol for 7 days or as instructed.   Eat a soft diet for the next 24 hours.  FINDING OUT THE RESULTS OF YOUR TEST Not all test results are available during your visit. If your test results are not back during the visit, make an appointment  with your caregiver to find out the results. Do not assume everything is normal if you have not heard from your caregiver or the medical facility. It is important for you to follow up on all of your test results.  SEEK IMMEDIATE MEDICAL ATTENTION IF:  You have more than a spotting of blood in your stool.   Your belly is swollen (abdominal distention).   You are nauseated or vomiting.   You have a temperature over 101.   You have abdominal pain or discomfort that is severe or gets worse throughout the day.   Diverticulosis information provided  Repeat colonoscopy in 10 years for screening purposes  Compression seen from outside your colon pushing on the inside of your colon. Cause uncertain. We will get a CT scan to look at that further. We will order a contrast CT of the abdomen pelvis to evaluate further (extrinsic mass effect on cecum). OFFICE TO CALL WITH APPOINTMENT  Further recommendations to follow  At patient request, I called Peyton Najjar at [phone] -unable to reach   Diverticulosis  Diverticulosis is a condition that develops when small pouches (diverticula) form in the wall of the large intestine (colon). The colon is where water is absorbed and stool (feces) is formed. The pouches form when the inside layer of the colon pushes through weak spots in the outer layers of the colon. You may have a few pouches or many of them. The pouches usually do not cause  problems unless they become inflamed or infected. When this happens, the condition is called diverticulitis. What are the causes? The cause of this condition is not known. What increases the risk? The following factors may make you more likely to develop this condition:  Being older than age 59. Your risk for this condition increases with age. Diverticulosis is rare among people younger than age 64. By age 32, many people have it.  Eating a low-fiber diet.  Having frequent constipation.  Being overweight.  Not  getting enough exercise.  Smoking.  Taking over-the-counter pain medicines, like aspirin and ibuprofen.  Having a family history of diverticulosis. What are the signs or symptoms? In most people, there are no symptoms of this condition. If you do have symptoms, they may include:  Bloating.  Cramps in the abdomen.  Constipation or diarrhea.  Pain in the lower left side of the abdomen. How is this diagnosed? Because diverticulosis usually has no symptoms, it is most often diagnosed during an exam for other colon problems. The condition may be diagnosed by:  Using a flexible scope to examine the colon (colonoscopy).  Taking an X-ray of the colon after dye has been put into the colon (barium enema).  Having a CT scan. How is this treated? You may not need treatment for this condition. Your health care provider may recommend treatment to prevent problems. You may need treatment if you have symptoms or if you previously had diverticulitis. Treatment may include:  Eating a high-fiber diet.  Taking a fiber supplement.  Taking a live bacteria supplement (probiotic).  Taking medicine to relax your colon.   Follow these instructions at home: Medicines  Take over-the-counter and prescription medicines only as told by your health care provider.  If told by your health care provider, take a fiber supplement or probiotic. Constipation prevention Your condition may cause constipation. To prevent or treat constipation, you may need to:  Drink enough fluid to keep your urine pale yellow.  Take over-the-counter or prescription medicines.  Eat foods that are high in fiber, such as beans, whole grains, and fresh fruits and vegetables.  Limit foods that are high in fat and processed sugars, such as fried or sweet foods.   General instructions  Try not to strain when you have a bowel movement.  Keep all follow-up visits as told by your health care provider. This is important. Contact  a health care provider if you:  Have pain in your abdomen.  Have bloating.  Have cramps.  Have not had a bowel movement in 3 days. Get help right away if:  Your pain gets worse.  Your bloating becomes very bad.  You have a fever or chills, and your symptoms suddenly get worse.  You vomit.  You have bowel movements that are bloody or black.  You have bleeding from your rectum. Summary  Diverticulosis is a condition that develops when small pouches (diverticula) form in the wall of the large intestine (colon).  You may have a few pouches or many of them.  This condition is most often diagnosed during an exam for other colon problems.  Treatment may include increasing the fiber in your diet, taking supplements, or taking medicines. This information is not intended to replace advice given to you by your health care provider. Make sure you discuss any questions you have with your health care provider. Document Revised: 06/20/2019 Document Reviewed: 06/20/2019 Elsevier Patient Education  2021 [employer].

## 2020-12-30 NOTE — Telephone Encounter (Signed)
Lurae from Short Stay called to say that we needed to put the order in and schedule CT for patient.

## 2020-12-31 ENCOUNTER — Ambulatory Visit (HOSPITAL_COMMUNITY)
Admission: RE | Admit: 2020-12-31 | Discharge: 2020-12-31 | Disposition: A | Discharge status: Home / Self Care | Payer: Medicare Other | Source: Ambulatory Visit | Attending: Internal Medicine | Admitting: Internal Medicine

## 2020-12-31 DIAGNOSIS — R933 Abnormal findings on diagnostic imaging of other parts of digestive tract: Secondary | ICD-10-CM | POA: Diagnosis not present

## 2020-12-31 LAB — POCT I-STAT CREATININE: Creatinine, Ser: 0.9 mg/dL (ref 0.44–1.00)

## 2020-12-31 MED ORDER — IOHEXOL 300 MG/ML  SOLN
100.0000 mL | Freq: Once | INTRAMUSCULAR | Status: AC | PRN
  Administered 2020-12-31: 100 mL via INTRAVENOUS

## 2021-01-01 ENCOUNTER — Encounter (HOSPITAL_COMMUNITY): Payer: Self-pay | Admitting: Internal Medicine

## 2021-01-05 ENCOUNTER — Ambulatory Visit (INDEPENDENT_AMBULATORY_CARE_PROVIDER_SITE_OTHER): Payer: Medicare Other | Admitting: Internal Medicine

## 2021-01-05 ENCOUNTER — Encounter (INDEPENDENT_AMBULATORY_CARE_PROVIDER_SITE_OTHER): Payer: Self-pay | Admitting: Internal Medicine

## 2021-01-05 ENCOUNTER — Other Ambulatory Visit: Payer: Self-pay

## 2021-01-05 VITALS — BP 118/72 | HR 90 | Temp 97.5°F | Ht 60.0 in | Wt 186.2 lb

## 2021-01-05 DIAGNOSIS — I10 Essential (primary) hypertension: Secondary | ICD-10-CM

## 2021-01-05 DIAGNOSIS — E785 Hyperlipidemia, unspecified: Secondary | ICD-10-CM | POA: Diagnosis not present

## 2021-01-05 DIAGNOSIS — M7989 Other specified soft tissue disorders: Secondary | ICD-10-CM

## 2021-01-05 DIAGNOSIS — E039 Hypothyroidism, unspecified: Secondary | ICD-10-CM | POA: Diagnosis not present

## 2021-01-05 NOTE — Progress Notes (Signed)
Metrics: Intervention Frequency ACO  Documented Smoking Status Yearly  Screened one or more times in 24 months  Cessation Counseling or  Active cessation medication Past 24 months  Past 24 months   Guideline developer: UpToDate (See UpToDate for funding source) Date Released: 2014       Wellness Office Visit  Subjective:  Patient ID: Barbara Bennett, female    DOB: March 24, 1956  Age: 65 y.o. MRN: [id number]  CC: This lady comes in for follow-up of hypertension, hypothyroidism and dyslipidemia. HPI  She was started on statin therapy a long time ago when she actually did have diabetes.  She no longer had diabetes. She continues with Avapro for her hypertension. She has tolerated the higher dose of NP thyroid 90 mg daily without any problems. Today she is also complaining of swelling of the left thigh and involving the left knee.  She has had surgery on the left knee several years ago.  However the left knee does not cause her any pain.  The swelling in the left leg has been present for approximately 1 week.  She denies any dyspnea or chest pain. Past Medical History:  Diagnosis Date  . Anemia   . Anemia, unspecified   . Arthritis   . Bell's palsy   . Essential (primary) hypertension   . Hyperlipidemia, unspecified   . Hypothyroidism, unspecified   . Irritable bowel syndrome with diarrhea   . Obesity, unspecified   . Type 2 diabetes mellitus without complications (HCC)    no longer on medications, previously on metformin for borderline DM, A1C 5.6   Past Surgical History:  Procedure Laterality Date  . CATARACT EXTRACTION W/PHACO Left 03/31/2016   Procedure: CATARACT EXTRACTION PHACO AND INTRAOCULAR LENS PLACEMENT (IOC);  Surgeon: Gemma Payor, MD;  Location: [facility];  Service: Ophthalmology;  Laterality: Left;  CDE: 6.45  . CATARACT EXTRACTION W/PHACO Right 04/28/2016   Procedure: CATARACT EXTRACTION PHACO AND INTRAOCULAR LENS PLACEMENT (IOC);  Surgeon: Gemma Payor, MD;  Location: [facility];  Service: Ophthalmology;  Laterality: Right;  CDE 5.00  . COLONOSCOPY N/A 12/30/2020   Procedure: COLONOSCOPY;  Surgeon: Corbin Ade, MD;  Location: [facility];  Service: Endoscopy;  Laterality: N/A;  9:30am  . KNEE ARTHROSCOPY Left      Family History  Problem Relation Age of Onset  . Diabetes Mother   . Heart disease Mother   . Heart disease Father   . Diabetes Sister   . Renal Disease Sister        Dialysis  . Asthma Sister   . Hypertension Sister   . Diabetes Sister   . Arthritis Sister   . Diabetes Sister   . Renal Disease Sister        Dialysis  . Hypertension Sister   . Migraines Daughter   . Pneumonia Son   . Colon cancer Neg Hx   . Sickle cell anemia Neg Hx   . Thalassemia Neg Hx   . 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   Social History   Tobacco Use  . Smoking status: Never Smoker  . Smokeless tobacco: Never Used  Substance Use Topics  . Alcohol use: No    Current Meds  Medication Sig  . atorvastatin (LIPITOR) 10 MG tablet Take 1 tablet (10 mg total) by mouth daily.  . Cinnamon 500 MG capsule Take 1,000 mg by mouth daily.  [name]  ferrous sulfate 325 (65 FE) MG tablet Take 325 mg by mouth daily with breakfast.  . ibuprofen (ADVIL,MOTRIN) 200 MG tablet Take 200-400 mg by mouth every 6 (six) hours as needed for moderate pain.  [name] irbesartan (AVAPRO) 75 MG tablet Take 1 tablet (75 mg total) by mouth daily.  . Multiple Vitamin (MULTIVITAMIN WITH MINERALS) TABS tablet Take 1 tablet by mouth daily.  . NP THYROID 90 MG tablet Take 1 tablet (90 mg total) by mouth daily.      Depression screen [facility] 2/9 01/05/2021 11/04/2020  Decreased Interest 0 0  Down, Depressed, Hopeless 0 0  PHQ - 2 Score 0 0  Altered sleeping 0 -  Tired, decreased energy 0 -  Change in appetite 0 -  Feeling bad or failure about yourself  0 -  Trouble concentrating 0 -   Moving slowly or fidgety/restless 0 -  Suicidal thoughts 0 -  PHQ-9 Score 0 -  Difficult doing work/chores Not difficult at all -     Objective:   Today's Vitals: BP 118/72   Pulse 90   Temp (!) 97.5 F (36.4 C) (Temporal)   Ht 5' (1.524 m)   Wt 186 lb 3.2 oz (84.5 kg)   LMP 04/01/2012   SpO2 96%   BMI 36.36 kg/m  Vitals with BMI 01/05/2021 12/30/2020 12/30/2020  Height 5\' 0"  - -  Weight 186 lbs 3 oz - -  BMI 36.36 - -  Systolic 118 93 113  Diastolic 72 59 83  Pulse 90 - 52     Physical Exam  She remains obese and has not lost any significant weight.  Blood pressure is excellent.  Examination of both legs show that there is swelling in the left thigh compared to the right thigh.  There is no warmth over the left knee.     Assessment   1. Leg swelling   2. Essential (primary) hypertension   3. Hypothyroidism, unspecified type   4. Hyperlipidemia, unspecified hyperlipidemia type       Tests ordered Orders Placed This Encounter  Procedures  . COMPLETE METABOLIC PANEL WITH GFR  . T3, free  . TSH  . D-dimer, quantitative (not at [facility])     Plan: 1. The etiology of the left leg swelling is unclear to me but obviously DVT is a consideration and I will check a D-dimer first.  If it is elevated, we will send her for venous ultrasound of the left leg. 2. She will continue with Avapro for hypertension which is controlling her blood pressure very well. 3. She will continue with NP thyroid 90 mg daily and I will check thyroid function. 4. We discussed statin therapy in general and, since she is no longer diabetic and issues with statin therapy based on the medical literature, we decided together with shared decision making to discontinue atorvastatin. 5. Follow-up in 3 months.   No orders of the defined types were placed in this encounter.   [facility], MD

## 2021-01-06 LAB — COMPLETE METABOLIC PANEL WITH GFR
AG Ratio: 1.3 (calc) (ref 1.0–2.5)
ALT: 14 U/L (ref 6–29)
AST: 17 U/L (ref 10–35)
Albumin: 4.1 g/dL (ref 3.6–5.1)
Alkaline phosphatase (APISO): 48 U/L (ref 37–153)
BUN/Creatinine Ratio: 17 (calc) (ref 6–22)
BUN: 19 mg/dL (ref 7–25)
CO2: 29 mmol/L (ref 20–32)
Calcium: 9.5 mg/dL (ref 8.6–10.4)
Chloride: 103 mmol/L (ref 98–110)
Creat: 1.13 mg/dL — ABNORMAL HIGH (ref 0.50–0.99)
GFR, Est African American: 59 mL/min/{1.73_m2} — ABNORMAL LOW (ref >=60)
GFR, Est Non African American: 51 mL/min/{1.73_m2} — ABNORMAL LOW (ref >=60)
Globulin: 3.1 g/dL (calc) (ref 1.9–3.7)
Glucose, Bld: 102 mg/dL (ref 65–139)
Potassium: 3.6 mmol/L (ref 3.5–5.3)
Sodium: 139 mmol/L (ref 135–146)
Total Bilirubin: 0.5 mg/dL (ref 0.2–1.2)
Total Protein: 7.2 g/dL (ref 6.1–8.1)

## 2021-01-06 LAB — D-DIMER, QUANTITATIVE: D-Dimer, Quant: 0.49 mcg/mL FEU (ref <0.50)

## 2021-01-06 LAB — T3, FREE: T3, Free: 5.6 pg/mL — ABNORMAL HIGH (ref 2.3–4.2)

## 2021-01-06 LAB — TSH: TSH: 0.02 mIU/L — ABNORMAL LOW (ref 0.40–4.50)

## 2021-03-12 ENCOUNTER — Other Ambulatory Visit (INDEPENDENT_AMBULATORY_CARE_PROVIDER_SITE_OTHER): Payer: Self-pay | Admitting: Internal Medicine

## 2021-03-12 DIAGNOSIS — I10 Essential (primary) hypertension: Secondary | ICD-10-CM

## 2021-03-12 DIAGNOSIS — E119 Type 2 diabetes mellitus without complications: Secondary | ICD-10-CM

## 2021-03-13 MED ORDER — IRBESARTAN 75 MG PO TABS: 75.0000 mg | ORAL_TABLET | Freq: Every day | ORAL | 0 refills | Status: DC

## 2021-03-17 IMAGING — CT CT ABD-PELV W/ CM
2 of 5 series · 17 of 46 positions shown, 19 images · IV contrast (Omnipaque or Isovue)
Comparison: None.

CLINICAL DATA: Abnormal colonoscopy with extrinsic mass effect on
the cecum, for further characterization.

EXAM:
CT ABDOMEN AND PELVIS WITH CONTRAST
TECHNIQUE: Multidetector CT imaging of the abdomen and pelvis was performed
using the standard protocol following bolus administration of
intravenous contrast.
CONTRAST:  100mL OMNIPAQUE IOHEXOL 300 MG/ML  SOLN

[Series 2: axial st · axial · 0.74mm/px · z∈[+992,+1367]mm · 14 of 85 slices shown, 16 images]
[im 5/85  soft-tissue]
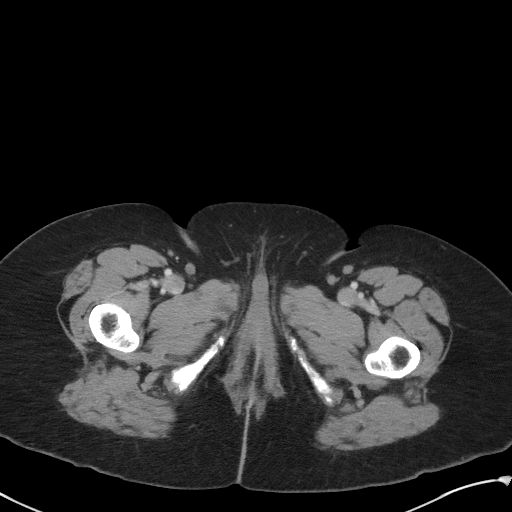
[im 5/85  bone]
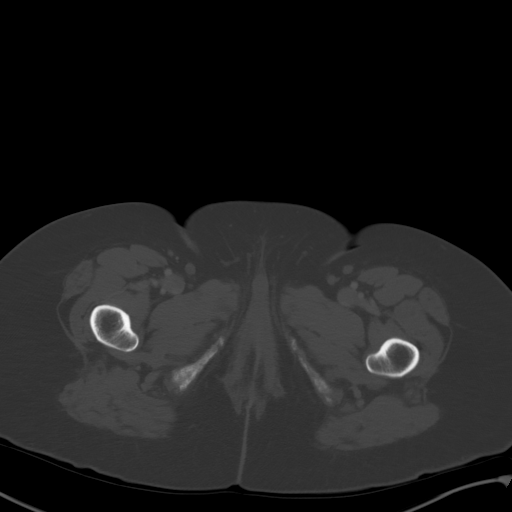
[im 10/85  soft-tissue]
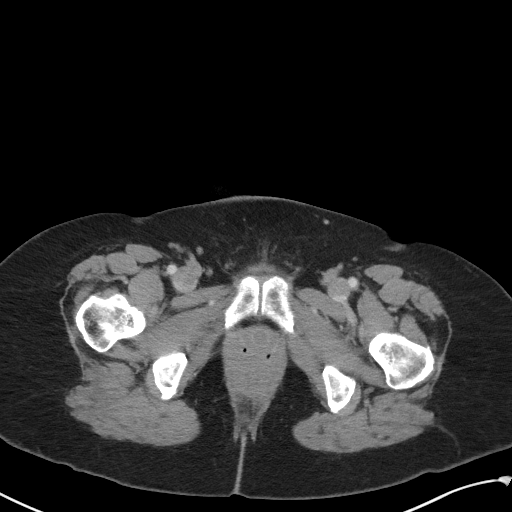
[im 19/85  soft-tissue]
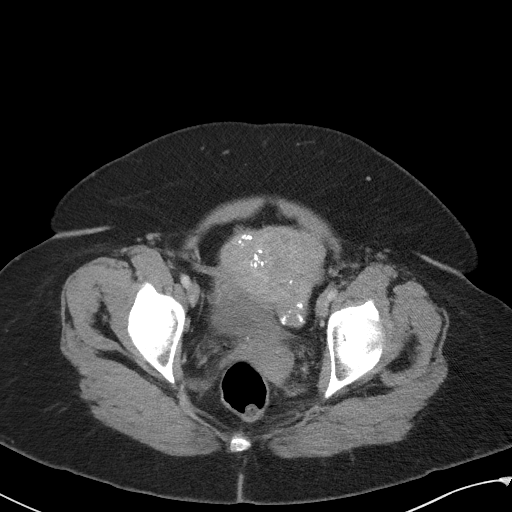
[im 24/85  soft-tissue]
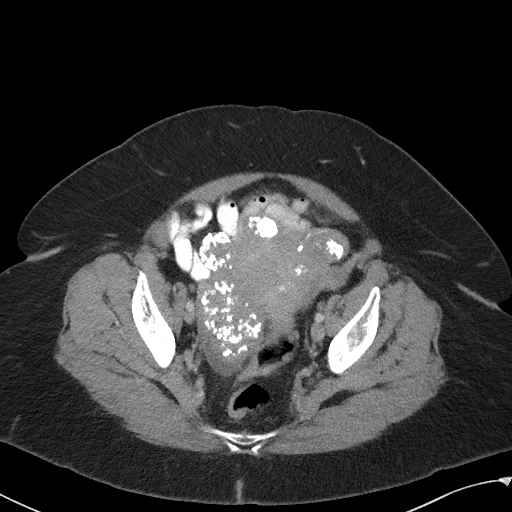
[im 29/85  soft-tissue]
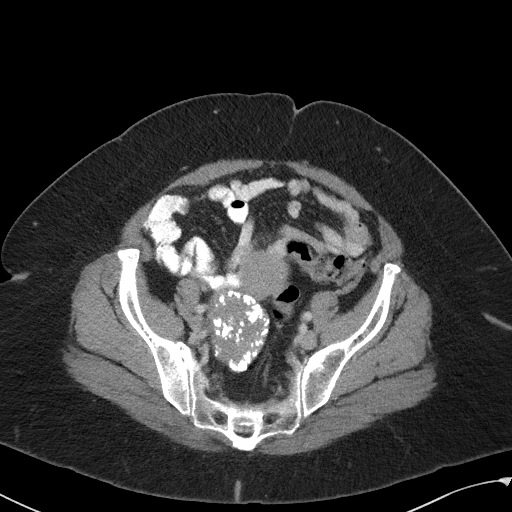
[im 33/85  soft-tissue]
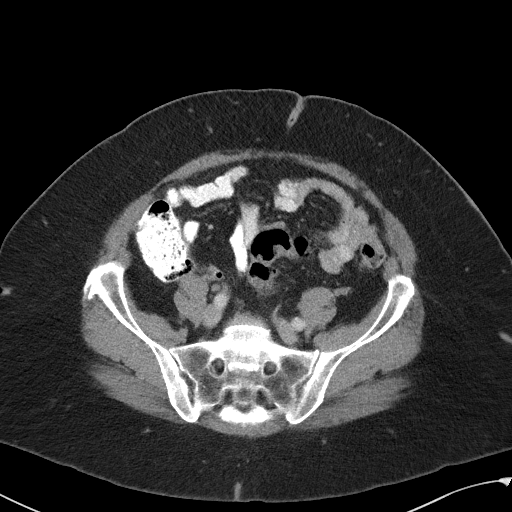
[im 38/85  soft-tissue]
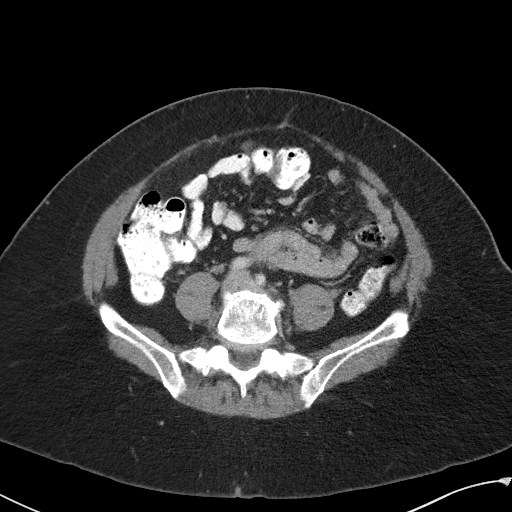
[im 47/85  soft-tissue]
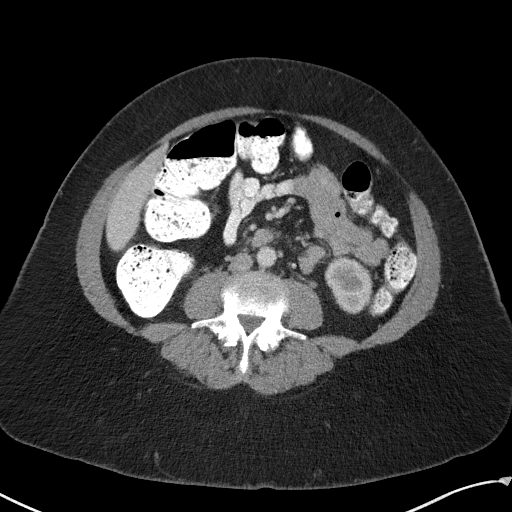
[im 52/85  soft-tissue]
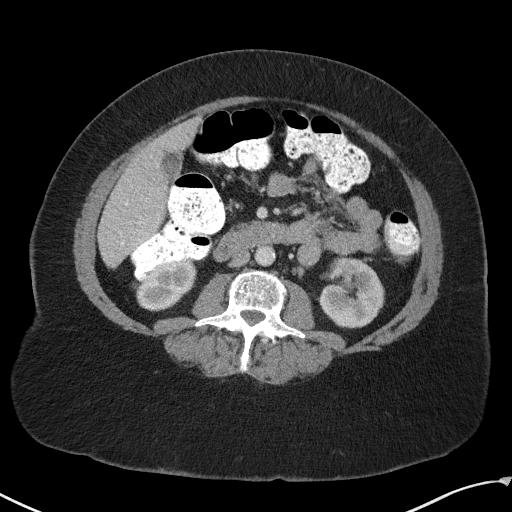
[im 52/85  bone]
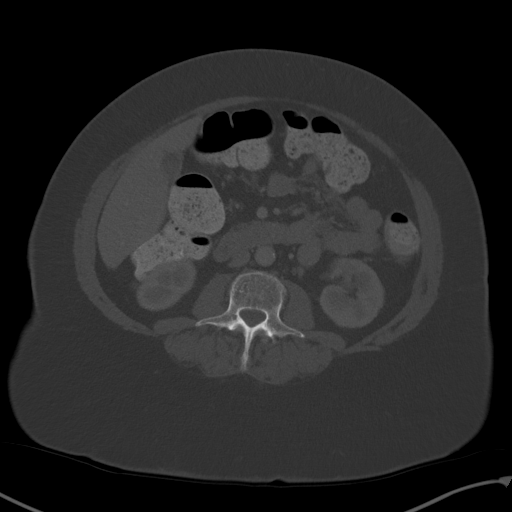
[im 57/85  soft-tissue]
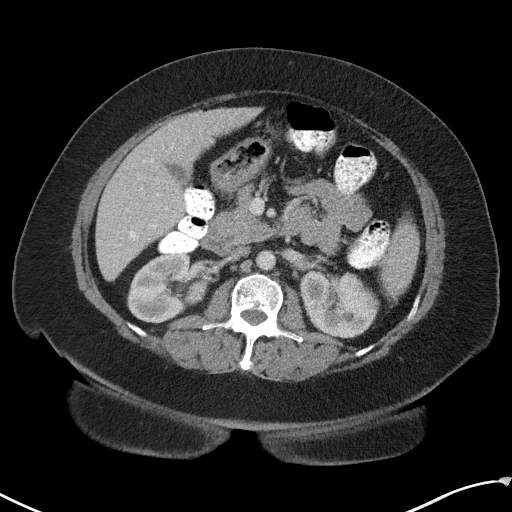
[im 61/85  soft-tissue]
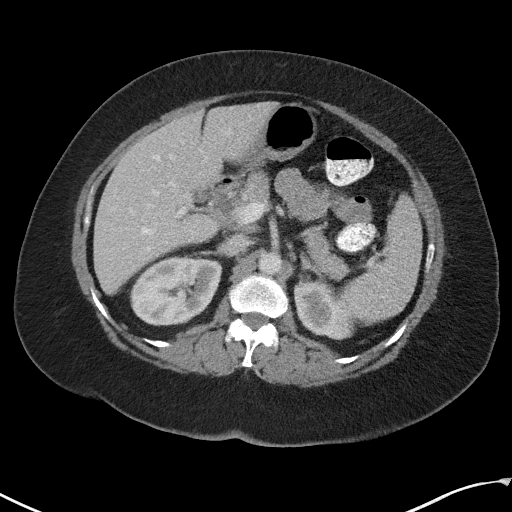
[im 66/85  soft-tissue]
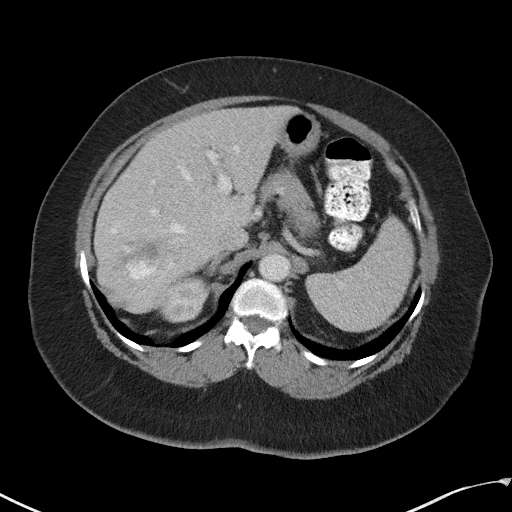
[im 75/85  soft-tissue]
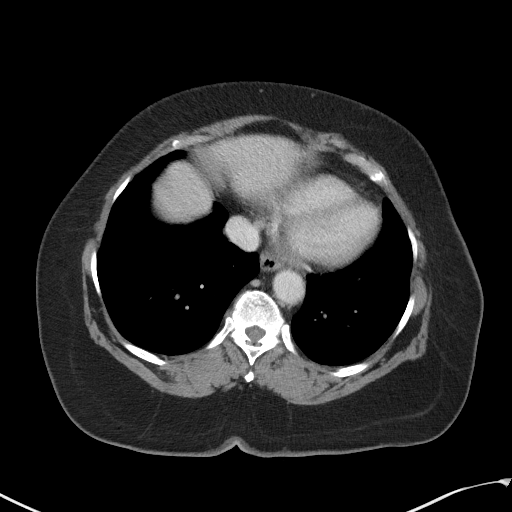
[im 80/85  soft-tissue]
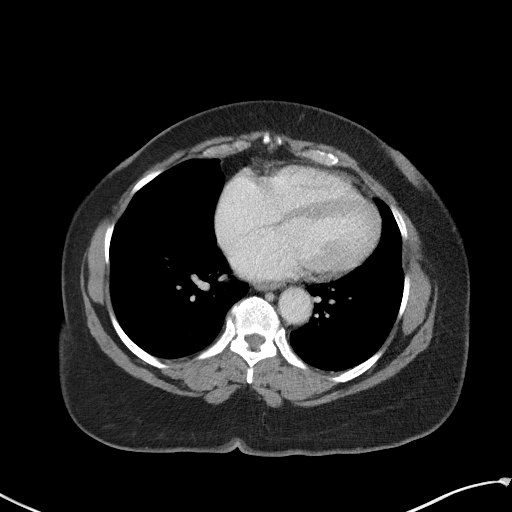

[Series 5: coronal st · coronal · 0.80mm/px · 3 of 126 slices shown]
[im 42/126  soft-tissue]
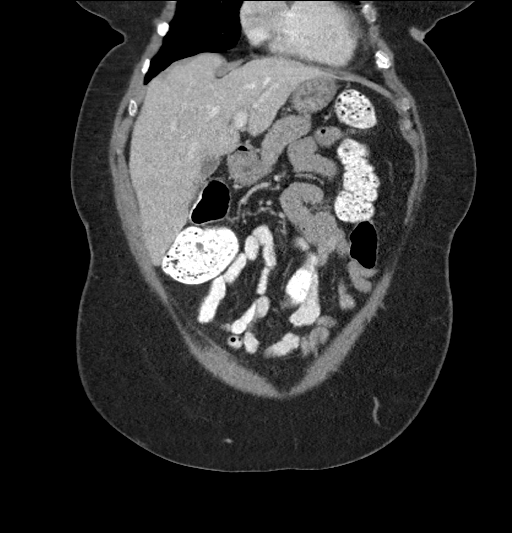
[im 56/126  soft-tissue]
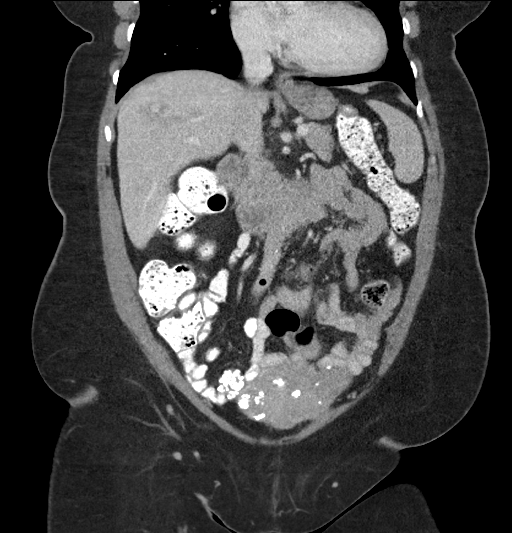
[im 70/126  soft-tissue]
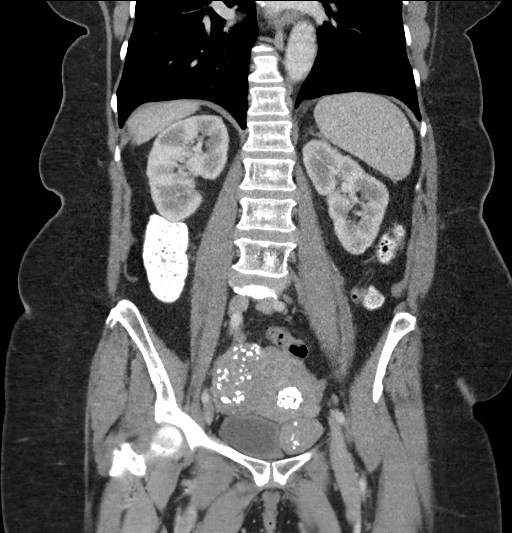

[17 of 46 positions shown; findings below may reference images not displayed]

FINDINGS: Lower chest: Mild cardiomegaly.

Hepatobiliary: Cholelithiasis. 3.0 cm lesion posteriorly in the
right hepatic lobe generally characteristic for hemangioma with
peripheral nodular centripetal enhancement and delayed enhancement.

Pancreas: Unremarkable

Spleen: Unremarkable

Adrenals/Urinary Tract: 0.7 cm hypodense lesion in the left mid
kidney laterally on image 13 of series 7 is statistically likely to
be a cyst but technically nonspecific due to small size.

Stomach/Bowel: Unremarkable

Vascular/Lymphatic: Aortoiliac atherosclerotic vascular disease. No
pathologic adenopathy.

Reproductive: Prominent uterine fibroids with varying degrees of
calcification, including intramural and subserosal fibroids some of
which is exert mild mass effect on the sigmoid colon as shown for
example on image 65 of series 3. An anterior uterine fibroid also
causes mass effect on the urinary bladder, displacing it to the
right.

Other: No supplemental non-categorized findings.

Musculoskeletal: Lower lumbar spondylosis and degenerative disc
disease.
IMPRESSION: 1. Prominent intramural and subserosal uterine fibroids causing
local mass effect on the sigmoid colon and urinary bladder.
2. Cholelithiasis.
3. Right hepatic lobe hemangioma.
4. Mild cardiomegaly.
5.  Aortic Atherosclerosis ([id number]).

## 2021-03-20 ENCOUNTER — Other Ambulatory Visit (INDEPENDENT_AMBULATORY_CARE_PROVIDER_SITE_OTHER): Payer: Self-pay | Admitting: Internal Medicine

## 2021-04-05 ENCOUNTER — Ambulatory Visit (INDEPENDENT_AMBULATORY_CARE_PROVIDER_SITE_OTHER): Payer: Medicare Other | Admitting: Internal Medicine

## 2021-04-05 ENCOUNTER — Encounter (INDEPENDENT_AMBULATORY_CARE_PROVIDER_SITE_OTHER): Payer: Self-pay | Admitting: Internal Medicine

## 2021-04-05 ENCOUNTER — Other Ambulatory Visit: Payer: Self-pay

## 2021-04-05 VITALS — BP 118/74 | HR 69 | Temp 97.5°F | Ht 60.0 in | Wt 192.0 lb

## 2021-04-05 DIAGNOSIS — I10 Essential (primary) hypertension: Secondary | ICD-10-CM

## 2021-04-05 DIAGNOSIS — E039 Hypothyroidism, unspecified: Secondary | ICD-10-CM | POA: Diagnosis not present

## 2021-04-05 DIAGNOSIS — R600 Localized edema: Secondary | ICD-10-CM

## 2021-04-05 NOTE — Progress Notes (Signed)
Metrics: Intervention Frequency ACO  Documented Smoking Status Yearly  Screened one or more times in 24 months  Cessation Counseling or  Active cessation medication Past 24 months  Past 24 months   Guideline developer: UpToDate (See UpToDate for funding source) Date Released: 2014       Wellness Office Visit  Subjective:  Patient ID: Barbara Bennett, female    DOB: Mar 18, 1956  Age: 65 y.o. MRN: [id number]  CC: This lady comes in for follow-up of  hypertension and hypothyroidism. HPI  She continues on NP thyroid 90 mg daily for her hypothyroidism and free T3 levels were in a good range last time. She continues on Avapro for hypertension. She describes swelling around the right ankle area especially after being up on her feet all day long.  She denies any trauma to this area.  Previously, she had swelling in both legs more on the left and this seems to have resolved.  There was no indication to perform a test for DVT as her D-dimer was actually normal. Past Medical History:  Diagnosis Date  . Anemia   . Anemia, unspecified   . Arthritis   . Bell's palsy   . Essential (primary) hypertension   . Hyperlipidemia, unspecified   . Hypothyroidism, unspecified   . Irritable bowel syndrome with diarrhea   . Obesity, unspecified   . Type 2 diabetes mellitus without complications (HCC)    no longer on medications, previously on metformin for borderline DM, A1C 5.6   Past Surgical History:  Procedure Laterality Date  . CATARACT EXTRACTION W/PHACO Left 03/31/2016   Procedure: CATARACT EXTRACTION PHACO AND INTRAOCULAR LENS PLACEMENT (IOC);  Surgeon: Gemma Payor, MD;  Location: [facility];  Service: Ophthalmology;  Laterality: Left;  CDE: 6.45  . CATARACT EXTRACTION W/PHACO Right 04/28/2016   Procedure: CATARACT EXTRACTION PHACO AND INTRAOCULAR LENS PLACEMENT (IOC);  Surgeon: Gemma Payor, MD;  Location: [facility];  Service: Ophthalmology;  Laterality: Right;  CDE 5.00  . COLONOSCOPY N/A 12/30/2020    Procedure: COLONOSCOPY;  Surgeon: Corbin Ade, MD;  Location: [facility];  Service: Endoscopy;  Laterality: N/A;  9:30am  . KNEE ARTHROSCOPY Left      Family History  Problem Relation Age of Onset  . Diabetes Mother   . Heart disease Mother   . Heart disease Father   . Diabetes Sister   . Renal Disease Sister        Dialysis  . Asthma Sister   . Hypertension Sister   . Diabetes Sister   . Arthritis Sister   . Diabetes Sister   . Renal Disease Sister        Dialysis  . Hypertension Sister   . Migraines Daughter   . Pneumonia Son   . Colon cancer Neg Hx   . Sickle cell anemia Neg Hx   . Thalassemia Neg Hx   . 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   Social History   Tobacco Use  . Smoking status: Never Smoker  . Smokeless tobacco: Never Used  Substance Use Topics  . Alcohol use: No    Current Meds  Medication Sig  . atorvastatin (LIPITOR) 10 MG tablet Take 1 tablet (10 mg total) by mouth daily.  . Cinnamon 500 MG capsule Take 1,000 mg by mouth daily.  . ferrous sulfate 325 (65 FE) MG tablet Take 325 mg by mouth daily with  breakfast.  . ibuprofen (ADVIL,MOTRIN) 200 MG tablet Take 200-400 mg by mouth every 6 (six) hours as needed for moderate pain.  [name] irbesartan (AVAPRO) 75 MG tablet Take 1 tablet (75 mg total) by mouth daily.  . Multiple Vitamin (MULTIVITAMIN WITH MINERALS) TABS tablet Take 1 tablet by mouth daily.  . NP THYROID 90 MG tablet TAKE 1 TABLET(90 MG) BY MOUTH DAILY     Flowsheet Row Office Visit from 04/05/2021 in [facility]  PHQ-9 Total Score 0      Objective:   Today's Vitals: BP 118/74   Pulse 69   Temp (!) 97.5 F (36.4 C) (Temporal)   Ht 5' (1.524 m)   Wt 192 lb (87.1 kg)   LMP 04/01/2012   SpO2 98%   BMI 37.50 kg/m  Vitals with BMI 04/05/2021 01/05/2021 12/30/2020  Height 5\' 0"  5\' 0"  -  Weight 192 lbs 186 lbs 3  oz -  BMI 37.5 36.36 -  Systolic 118 118 93  Diastolic 74 72 59  Pulse 69 90 -     Physical Exam   She remains obese and has gained 6 pounds in weight since the last visit.  Blood pressure is excellent.  She does have some mild pitting edema in the right lower leg and to some degree also in the left lower leg.    Assessment   1. Hypothyroidism, unspecified type   2. Essential (primary) hypertension   3. Localized edema       Tests ordered Orders Placed This Encounter  Procedures  . Basic metabolic panel     Plan: 1. She will continue with the same dose of NP thyroid for her hypothyroidism. 2. She will continue with Avapro for hypertension and we will check renal function now. 3. In terms of the peripheral pitting edema in her right lower leg/ankle area, I think this is dependent and I have recommended leg elevation 2-3 times a day, each time 20 to 30 minutes.  Hopefully this will help.  I did offer the option of a mild diuretic but she and I are both not keen on this at the present time. 4. Follow-up in about 4 months time.   No orders of the defined types were placed in this encounter.   , MD

## 2021-04-06 LAB — BASIC METABOLIC PANEL
BUN/Creatinine Ratio: 17 (calc) (ref 6–22)
BUN: 17 mg/dL (ref 7–25)
CO2: 25 mmol/L (ref 20–32)
Calcium: 9.1 mg/dL (ref 8.6–10.4)
Chloride: 105 mmol/L (ref 98–110)
Creat: 1.03 mg/dL — ABNORMAL HIGH (ref 0.50–0.99)
Glucose, Bld: 115 mg/dL (ref 65–139)
Potassium: 4 mmol/L (ref 3.5–5.3)
Sodium: 139 mmol/L (ref 135–146)

## 2021-06-14 ENCOUNTER — Other Ambulatory Visit (INDEPENDENT_AMBULATORY_CARE_PROVIDER_SITE_OTHER): Payer: Self-pay | Admitting: Internal Medicine

## 2021-06-14 DIAGNOSIS — E119 Type 2 diabetes mellitus without complications: Secondary | ICD-10-CM

## 2021-06-14 DIAGNOSIS — I10 Essential (primary) hypertension: Secondary | ICD-10-CM

## 2021-06-28 ENCOUNTER — Encounter (INDEPENDENT_AMBULATORY_CARE_PROVIDER_SITE_OTHER): Payer: Self-pay | Admitting: Internal Medicine

## 2021-06-29 ENCOUNTER — Other Ambulatory Visit (INDEPENDENT_AMBULATORY_CARE_PROVIDER_SITE_OTHER): Payer: Self-pay | Admitting: Internal Medicine

## 2021-06-29 ENCOUNTER — Encounter (INDEPENDENT_AMBULATORY_CARE_PROVIDER_SITE_OTHER): Payer: Self-pay | Admitting: Internal Medicine

## 2021-06-29 MED ORDER — NIRMATRELVIR/RITONAVIR (PAXLOVID)TABLET: 3.0000 | ORAL_TABLET | Freq: Two times a day (BID) | ORAL | 0 refills | Status: AC

## 2021-07-19 ENCOUNTER — Other Ambulatory Visit (INDEPENDENT_AMBULATORY_CARE_PROVIDER_SITE_OTHER): Payer: Self-pay | Admitting: Nurse Practitioner

## 2021-07-19 DIAGNOSIS — E039 Hypothyroidism, unspecified: Secondary | ICD-10-CM

## 2021-07-19 NOTE — Telephone Encounter (Signed)
Please call this patient and let her know that I saw her labs and I recommend she reduce her dose of NP thyroid to 60mg  by mouth daily. I also will refer her to endocrinology so they can follow-up with her an monitor her labs as we will be closing at the end of this month. Thank you.

## 2021-07-20 ENCOUNTER — Other Ambulatory Visit (INDEPENDENT_AMBULATORY_CARE_PROVIDER_SITE_OTHER): Payer: Self-pay

## 2021-07-20 DIAGNOSIS — E039 Hypothyroidism, unspecified: Secondary | ICD-10-CM

## 2021-07-26 ENCOUNTER — Encounter (INDEPENDENT_AMBULATORY_CARE_PROVIDER_SITE_OTHER): Payer: Self-pay | Admitting: Nurse Practitioner

## 2021-08-03 NOTE — Telephone Encounter (Signed)
I called the patient and gave her the message. Patient verbalized an understanding and stated that she has an appointment with a new PCP and a new referral doctor also.

## 2021-08-10 ENCOUNTER — Ambulatory Visit (INDEPENDENT_AMBULATORY_CARE_PROVIDER_SITE_OTHER): Payer: Medicare Other | Admitting: Internal Medicine

## 2021-08-19 ENCOUNTER — Other Ambulatory Visit: Payer: Self-pay

## 2021-08-19 ENCOUNTER — Ambulatory Visit (INDEPENDENT_AMBULATORY_CARE_PROVIDER_SITE_OTHER): Payer: Medicare Other | Admitting: Nurse Practitioner

## 2021-08-19 ENCOUNTER — Encounter: Payer: Self-pay | Admitting: Nurse Practitioner

## 2021-08-19 VITALS — BP 110/74 | HR 76 | Ht 60.0 in | Wt 193.8 lb

## 2021-08-19 DIAGNOSIS — E039 Hypothyroidism, unspecified: Secondary | ICD-10-CM | POA: Diagnosis not present

## 2021-08-19 MED ORDER — LEVOTHYROXINE SODIUM 88 MCG PO TABS: 88.0000 ug | ORAL_TABLET | Freq: Every day | ORAL | 1 refills | Status: DC

## 2021-08-19 NOTE — Patient Instructions (Signed)

## 2021-08-19 NOTE — Progress Notes (Signed)
Endocrinology Consult Note                                         08/19/2021, 9:38 AM  Subjective:   Subjective    Barbara Bennett is a 65 y.o.-year-old female patient being seen in consultation for hypothyroidism referred by Heather Roberts, NP.  She was a patient of Dr. Karilyn Cota.   Past Medical History:  Diagnosis Date   Anemia    Anemia, unspecified    Arthritis    Bell's palsy    Essential (primary) hypertension    Hyperlipidemia, unspecified    Hypothyroidism, unspecified    Irritable bowel syndrome with diarrhea    Obesity, unspecified    Type 2 diabetes mellitus without complications (HCC)    no longer on medications, previously on metformin for borderline DM, A1C 5.6    Past Surgical History:  Procedure Laterality Date   CATARACT EXTRACTION W/PHACO Left 03/31/2016   Procedure: CATARACT EXTRACTION PHACO AND INTRAOCULAR LENS PLACEMENT (IOC);  Surgeon: Gemma Payor, MD;  Location: [facility];  Service: Ophthalmology;  Laterality: Left;  CDE: 6.45   CATARACT EXTRACTION W/PHACO Right 04/28/2016   Procedure: CATARACT EXTRACTION PHACO AND INTRAOCULAR LENS PLACEMENT (IOC);  Surgeon: Gemma Payor, MD;  Location: [facility];  Service: Ophthalmology;  Laterality: Right;  CDE 5.00   COLONOSCOPY N/A 12/30/2020   Procedure: COLONOSCOPY;  Surgeon: Corbin Ade, MD;  Location: [facility];  Service: Endoscopy;  Laterality: N/A;  9:30am   KNEE ARTHROSCOPY Left     Social History   Socioeconomic History   Marital status: Married    Spouse name: Not on file   Number of children: Not on file   Years of education: 12   Highest education level: Not on file  Occupational History   Occupation: Merchandiser, retail at [employer]  Tobacco Use   Smoking status: Never   Smokeless tobacco: Never  Vaping Use   Vaping Use: Never used  Substance and Sexual Activity   Alcohol use: No   Drug use: No   Sexual activity: Never  Other  Topics Concern   Not on file  Social History Narrative   Married for 26 years,second.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   Social Determinants of Health   Financial Resource Strain: Not on file  Food Insecurity: Not on file  Transportation Needs: Not on file  Physical Activity: Not on file  Stress: Not on file  Social Connections: Not on file    Family History  Problem Relation Age of Onset   Diabetes Mother    Heart disease Mother    Heart disease Father    Diabetes Sister    Renal Disease Sister        Dialysis   Asthma Sister    Hypertension Sister    Diabetes Sister    Arthritis Sister    Diabetes Sister    Renal Disease Sister        Dialysis   Hypertension Sister    Migraines  Daughter    Pneumonia Son    Colon cancer Neg Hx    Sickle cell anemia Neg Hx    Thalassemia Neg Hx    Leukemia Neg Hx     Outpatient Encounter Medications as of 08/19/2021  Medication Sig   atorvastatin (LIPITOR) 10 MG tablet Take 1 tablet (10 mg total) by mouth daily.   Cinnamon 500 MG capsule Take 1,000 mg by mouth daily.   ferrous sulfate 325 (65 FE) MG tablet Take 325 mg by mouth daily with breakfast.   ibuprofen (ADVIL,MOTRIN) 200 MG tablet Take 200-400 mg by mouth every 6 (six) hours as needed for moderate pain.   irbesartan (AVAPRO) 75 MG tablet TAKE 1 TABLET(75 MG) BY MOUTH DAILY   levothyroxine (SYNTHROID) 88 MCG tablet Take 1 tablet (88 mcg total) by mouth daily.   Multiple Vitamin (MULTIVITAMIN WITH MINERALS) TABS tablet Take 1 tablet by mouth daily.   [DISCONTINUED] thyroid (NP THYROID) 60 MG tablet Take 1 tablet (60 mg total) by mouth daily before breakfast.   No facility-administered encounter medications on file as of 08/19/2021.    ALLERGIES: Allergies  Allergen Reactions   Penicillins Rash    Has patient had a PCN reaction causing immediate rash, facial/tongue/throat swelling, SOB or lightheadedness with hypotension: Yes Has patient had a PCN  reaction causing severe rash involving mucus membranes or skin necrosis: No Has patient had a PCN reaction that required hospitalization No Has patient had a PCN reaction occurring within the last 10 years: Yes If all of the above answers are "NO", then may proceed with Cephalosporin use.    Sulfa Antibiotics Rash   VACCINATION STATUS: Immunization History  Administered Date(s) Administered   Fluad Quad(high Dose 65+) 09/29/2020   Moderna Sars-Covid-2 Vaccination 01/15/2020, 02/12/2020, 11/09/2020   Pneumococcal Conjugate-13 11/04/2020   Pneumococcal Polysaccharide-23 08/06/2019     HPI   Barbara Bennett  is a patient with the above medical history. she was diagnosed  with hypothyroidism at approximate age of 26 years, which required subsequent initiation of thyroid hormone replacement. she was given various doses of NP Thyroid since, currently on 60 mg. she reports compliance to this medication:  Taking it daily, however she does take it with all of her other medications including MVI, and iron supplements.     I reviewed patient's thyroid tests:  Lab Results  Component Value Date   TSH 0.02 (L) 01/05/2021   TSH 5.58 (H) 11/04/2020   TSH 4.43 08/19/2020   TSH 5.74 06/06/2018   FREET4 1.2 08/19/2020     Pt describes: - weight gain - fatigue - cold intolerance - constipation - dry skin - hair loss  Pt denies feeling nodules in neck, hoarseness, dysphagia/odynophagia, SOB with lying down.  she denies family history of thyroid disorders.  No family history of thyroid cancer.  No history of radiation therapy to head or neck.  No recent use of iodine supplements.  She does take a Womens MVI with Biotin in it.   I reviewed her chart and she also has a history of DM 2, HTN, HLD, IBS.   ROS:  Constitutional: + weight gain, + fatigue, no subjective hyperthermia, no subjective hypothermia Eyes: no blurry vision, no xerophthalmia ENT: no sore throat, no nodules palpated in  throat, no dysphagia/odynophagia, no hoarseness Cardiovascular: no chest pain, no SOB, no palpitations, mild intermittent leg swelling after working all day on her feet Respiratory: no cough, no SOB Gastrointestinal: no nausea/vomiting/diarrhea Musculoskeletal: no muscle/joint aches Skin:  no rashes Neurological: no tremors, no numbness, no tingling, no dizziness Psychiatric: no depression, no anxiety   Objective:   Objective     BP 110/74   Pulse 76   Ht 5' (1.524 m)   Wt 193 lb 12.8 oz (87.9 kg)   LMP 04/01/2012   BMI 37.85 kg/m  Wt Readings from Last 3 Encounters:  08/19/21 193 lb 12.8 oz (87.9 kg)  04/05/21 192 lb (87.1 kg)  01/05/21 186 lb 3.2 oz (84.5 kg)    BP Readings from Last 3 Encounters:  08/19/21 110/74  04/05/21 118/74  01/05/21 118/72     Constitutional:  Body mass index is 37.85 kg/m., not in acute distress, normal state of mind Eyes: PERRLA, EOMI, no exophthalmos ENT: moist mucous membranes, no thyromegaly, no cervical lymphadenopathy Cardiovascular: normal precordial activity, RRR, no murmur/rubs/gallops Respiratory:  adequate breathing efforts, no gross chest deformity, Clear to auscultation bilaterally Gastrointestinal: abdomen soft, non-tender, no distension, bowel sounds present Musculoskeletal: no gross deformities, strength intact in all four extremities Skin: moist, warm, no rashes Neurological: no tremor with outstretched hands, deep tendon reflexes normal in BLE.   CMP ( most recent) CMP     Component Value Date/Time   NA 139 04/05/2021 1420   NA 139 06/06/2018 0000   K 4.0 04/05/2021 1420   CL 105 04/05/2021 1420   CO2 25 04/05/2021 1420   GLUCOSE 115 04/05/2021 1420   BUN 17 04/05/2021 1420   BUN 16 06/06/2018 0000   CREATININE 1.03 (H) 04/05/2021 1420   CALCIUM 9.1 04/05/2021 1420   PROT 7.2 01/05/2021 1313   ALBUMIN 3.8 06/06/2018 0000   AST 17 01/05/2021 1313   ALT 14 01/05/2021 1313   BILITOT 0.5 01/05/2021 1313    GFRNONAA 51 (L) 01/05/2021 1313   GFRAA 59 (L) 01/05/2021 1313     Diabetic Labs (most recent): Lab Results  Component Value Date   HGBA1C 5.4 08/19/2020   HGBA1C 5.9 06/06/2018     Lipid Panel ( most recent) Lipid Panel     Component Value Date/Time   CHOL 155 08/19/2020 0818   TRIG 118 08/19/2020 0818   HDL 42 (L) 08/19/2020 0818   CHOLHDL 3.7 08/19/2020 0818   LDLCALC 92 08/19/2020 0818       Lab Results  Component Value Date   TSH 0.02 (L) 01/05/2021   TSH 5.58 (H) 11/04/2020   TSH 4.43 08/19/2020   TSH 5.74 06/06/2018   FREET4 1.2 08/19/2020      Assessment & Plan:   ASSESSMENT / PLAN:  1. Hypothyroidism- unspecified   Patient with relatively new hypothyroidism (diagnosed last year), on NP Thyroid therapy. On physical exam, patient does not have gross goiter, thyroid nodules, or neck compression symptoms.  She is in agreement to change from NP thyroid to Levothyroxine which will make interpreting thyroid labs easier.  She is advised to start Levothyroxine 88 mcg po daily before breakfast.  - We discussed about correct intake of levothyroxine, at fasting, with water, separated by at least 30 minutes from breakfast, and separated by more than 4 hours from calcium, iron, multivitamins, acid reflux medications (PPIs). -Patient is made aware of the fact that thyroid hormone replacement is needed for life, dose to be adjusted by periodic monitoring of thyroid function tests. - Will check thyroid tests before next visit: TSH, free T4, FT3, and antibody testing to assess for autoimmune thyroid dysfunction which will help classify her hypothyroidism further.  -Due to absence of clinical goiter,  no need for thyroid ultrasound.  - Time spent with the patient: 45 minutes, of which >50% was spent in obtaining information about her symptoms, reviewing her previous labs, evaluations, and treatments, counseling her about her hypothyroidism, and developing a plan to confirm the  diagnosis and long term treatment as necessary. Please refer to "Patient Self Inventory" in the Media tab for reviewed elements of pertinent patient history.  Telford Nab Colcord participated in the discussions, expressed understanding, and voiced agreement with the above plans.  All questions were answered to her satisfaction. she is encouraged to contact clinic should she have any questions or concerns prior to her return visit.   FOLLOW UP PLAN:  Return in about 2 months (around 10/19/2021) for Thyroid follow up, Previsit labs.  Ronny Bacon, [facility] [address] [place], [place] [address] Phone: [phone] Fax: [phone]  08/19/2021, 9:38 AM

## 2021-08-24 ENCOUNTER — Encounter: Payer: Self-pay | Admitting: Nurse Practitioner

## 2021-08-24 ENCOUNTER — Other Ambulatory Visit: Payer: Self-pay

## 2021-08-24 ENCOUNTER — Ambulatory Visit (INDEPENDENT_AMBULATORY_CARE_PROVIDER_SITE_OTHER): Payer: Medicare Other | Admitting: Nurse Practitioner

## 2021-08-24 VITALS — BP 135/75 | HR 80 | Temp 98.5°F | Ht 60.0 in | Wt 194.0 lb

## 2021-08-24 DIAGNOSIS — Z7689 Persons encountering health services in other specified circumstances: Secondary | ICD-10-CM

## 2021-08-24 DIAGNOSIS — I10 Essential (primary) hypertension: Secondary | ICD-10-CM | POA: Diagnosis not present

## 2021-08-24 DIAGNOSIS — Z23 Encounter for immunization: Secondary | ICD-10-CM | POA: Diagnosis not present

## 2021-08-24 DIAGNOSIS — E785 Hyperlipidemia, unspecified: Secondary | ICD-10-CM

## 2021-08-24 DIAGNOSIS — E119 Type 2 diabetes mellitus without complications: Secondary | ICD-10-CM | POA: Diagnosis not present

## 2021-08-24 DIAGNOSIS — D508 Other iron deficiency anemias: Secondary | ICD-10-CM

## 2021-08-24 DIAGNOSIS — E039 Hypothyroidism, unspecified: Secondary | ICD-10-CM

## 2021-08-24 MED ORDER — IRBESARTAN 75 MG PO TABS: ORAL_TABLET | ORAL | 3 refills | Status: DC

## 2021-08-24 MED ORDER — ATORVASTATIN CALCIUM 10 MG PO TABS: 10.0000 mg | ORAL_TABLET | Freq: Every day | ORAL | 3 refills | Status: DC

## 2021-08-24 NOTE — Assessment & Plan Note (Signed)
BP Readings from Last 3 Encounters:  08/24/21 135/75  08/19/21 110/74  04/05/21 118/74   -well controlled today -takes irbesartan for renal protection

## 2021-08-24 NOTE — Assessment & Plan Note (Signed)
Lab Results  Component Value Date   HGBA1C 5.4 08/19/2020   -check A1c with next set of labs -she states she likes to drink sweet tea and hasn't been sticking to her diet the way that she should -took metformin in the past

## 2021-08-24 NOTE — Assessment & Plan Note (Signed)
-  check iron panel with labs -she has been taking iron supplement for several years

## 2021-08-24 NOTE — Progress Notes (Signed)
New Patient Office Visit  Subjective:  Patient ID: Barbara Bennett, female    DOB: 1956-02-09  Age: 65 y.o. MRN: [id number]  CC:  Chief Complaint  Patient presents with   New Patient (Initial Visit)    Here to establish care. No complaints today.    HPI Barbara Bennett presents for new patient visit. Transferring care from [facility]. Last physical was over a year ago. Last labs were drawn in May.  She is followed by endocrinology for hypothyroidism.  She was on metformin previously.  Past Medical History:  Diagnosis Date   Anemia    Anemia, unspecified    Arthritis    knees   Bell's palsy    Hyperlipidemia, unspecified    Hypothyroidism, unspecified    Obesity, unspecified    Type 2 diabetes mellitus without complications (HCC)    no longer on medications, previously on metformin for borderline DM, A1C 5.6    Past Surgical History:  Procedure Laterality Date   CATARACT EXTRACTION W/PHACO Left 03/31/2016   Procedure: CATARACT EXTRACTION PHACO AND INTRAOCULAR LENS PLACEMENT ([place]);  Surgeon: Tonny Branch, MD;  Location: [facility];  Service: Ophthalmology;  Laterality: Left;  CDE: 6.45   CATARACT EXTRACTION W/PHACO Right 04/28/2016   Procedure: CATARACT EXTRACTION PHACO AND INTRAOCULAR LENS PLACEMENT (IOC);  Surgeon: Tonny Branch, MD;  Location: [facility];  Service: Ophthalmology;  Laterality: Right;  CDE 5.00   COLONOSCOPY N/A 12/30/2020   Procedure: COLONOSCOPY;  Surgeon: Daneil Dolin, MD;  [facility];  Service: Endoscopy;  Laterality: N/A;  9:30am   KNEE ARTHROSCOPY Left     Family History  Problem Relation Age of Onset   Diabetes Mother    Heart disease Mother    Heart disease Father    Diabetes Sister    Renal Disease Sister        Dialysis   Asthma Sister    Hypertension Sister    Diabetes Sister    Arthritis Sister    Diabetes Sister    Renal Disease Sister        Dialysis   Hypertension Sister    Migraines Daughter    Pneumonia Son    Colon cancer  Neg Hx    Sickle cell anemia Neg Hx    Thalassemia Neg Hx    Leukemia Neg Hx     Social History   Socioeconomic History   Marital status: Married    Spouse name: Not on file   Number of children: 2   Years of education: 12   Highest education level: Not on file  Occupational History   Occupation: Retired- worked at [employer]  Tobacco Use   Smoking status: Never   Smokeless tobacco: Never  Vaping Use   Vaping Use: Never used  Substance and Sexual Activity   Alcohol use: No   Drug use: No   Sexual activity: Not Currently  Other Topics Concern   Not on file  Social History Narrative   Married for 35 years,second.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      Son deceased   Daughter lives nearby   Social Determinants of Health   Financial Resource Strain: Not on file  Food Insecurity: Not on file  Transportation Needs: Not on file  Physical Activity: Not on file  Stress: Not on file  Social Connections: Not on file  Intimate Partner Violence: Not on file    ROS Review of Systems  Objective:   Today's Vitals: BP 135/75 (BP  Location: Left Arm, Patient Position: Sitting, Cuff Size: Large)   Pulse 80   Temp 98.5 F (36.9 C) (Oral)   Ht 5' (1.524 m)   Wt 194 lb (88 kg)   LMP 04/01/2012   SpO2 98%   BMI 37.89 kg/m   Physical Exam  Assessment & Plan:   Problem List Items Addressed This Visit       Cardiovascular and Mediastinum   Essential (primary) hypertension    BP Readings from Last 3 Encounters:  08/24/21 135/75  08/19/21 110/74  04/05/21 118/74  -well controlled today -takes irbesartan for renal protection      Relevant Medications   irbesartan (AVAPRO) 75 MG tablet   atorvastatin (LIPITOR) 10 MG tablet   Other Relevant Orders   CBC with Differential/Platelet   CMP14+EGFR   Lipid Panel With LDL/HDL Ratio     Endocrine   Hypothyroidism, unspecified    -followed by endocrinology -was recently transitioned from NP thyroid  to levothyroxine      Type 2 diabetes mellitus without complications ([place])    Lab Results  Component Value Date   HGBA1C 5.4 08/19/2020  -check A1c with next set of labs -she states she likes to drink sweet tea and hasn't been sticking to her diet the way that she should -took metformin in the past      Relevant Medications   irbesartan (AVAPRO) 75 MG tablet   atorvastatin (LIPITOR) 10 MG tablet   Other Relevant Orders   CBC with Differential/Platelet   CMP14+EGFR   Lipid Panel With LDL/HDL Ratio   Hemoglobin A1c     Other   Anemia, unspecified    -check iron panel with labs -she has been taking iron supplement for several years      Relevant Orders   CBC with Differential/Platelet   Iron, TIBC and Ferritin Panel   Hyperlipidemia, unspecified    -takes atorvastatin; refilled today -check lipid panel with labs      Relevant Medications   irbesartan (AVAPRO) 75 MG tablet   atorvastatin (LIPITOR) 10 MG tablet   Other Relevant Orders   CMP14+EGFR   Lipid Panel With LDL/HDL Ratio   Other Visit Diagnoses     Establishing care with new doctor, encounter for    -  Primary   Relevant Orders   CBC with Differential/Platelet   CMP14+EGFR   Lipid Panel With LDL/HDL Ratio   Iron, TIBC and Ferritin Panel   Hemoglobin A1c       Outpatient Encounter Medications as of 08/24/2021  Medication Sig   Cinnamon 500 MG capsule Take 1,000 mg by mouth daily.   ferrous sulfate 325 (65 FE) MG tablet Take 325 mg by mouth daily with breakfast.   ibuprofen (ADVIL,MOTRIN) 200 MG tablet Take 200-400 mg by mouth every 6 (six) hours as needed for moderate pain.   levothyroxine (SYNTHROID) 88 MCG tablet Take 1 tablet (88 mcg total) by mouth daily.   [DISCONTINUED] atorvastatin (LIPITOR) 10 MG tablet Take 1 tablet (10 mg total) by mouth daily.   [DISCONTINUED] irbesartan (AVAPRO) 75 MG tablet TAKE 1 TABLET(75 MG) BY MOUTH DAILY   atorvastatin (LIPITOR) 10 MG tablet Take 1 tablet (10 mg  total) by mouth daily.   irbesartan (AVAPRO) 75 MG tablet TAKE 1 TABLET(75 MG) BY MOUTH DAILY   [DISCONTINUED] Multiple Vitamin (MULTIVITAMIN WITH MINERALS) TABS tablet Take 1 tablet by mouth daily. (Patient not taking: Reported on 08/24/2021)   No facility-administered encounter medications on file as of 08/24/2021.  Follow-up: Return in about 2 months (around 10/24/2021) for Lab follow-up (DM).   Noreene Larsson, NP

## 2021-08-24 NOTE — Assessment & Plan Note (Signed)
-  takes atorvastatin; refilled today -check lipid panel with labs

## 2021-08-24 NOTE — Assessment & Plan Note (Signed)
-  followed by endocrinology -was recently transitioned from NP thyroid to levothyroxine

## 2021-08-24 NOTE — Patient Instructions (Signed)
Please have fasting labs drawn 2-3 days prior to your appointment so we can discuss the results during your office visit.  

## 2021-09-30 ENCOUNTER — Other Ambulatory Visit: Payer: Self-pay

## 2021-09-30 ENCOUNTER — Ambulatory Visit: Payer: Medicare Other

## 2021-10-04 ENCOUNTER — Ambulatory Visit (INDEPENDENT_AMBULATORY_CARE_PROVIDER_SITE_OTHER): Payer: Medicare Other | Admitting: *Deleted

## 2021-10-04 ENCOUNTER — Other Ambulatory Visit: Payer: Self-pay

## 2021-10-04 DIAGNOSIS — Z0000 Encounter for general adult medical examination without abnormal findings: Secondary | ICD-10-CM

## 2021-10-04 NOTE — Progress Notes (Signed)
Subjective:   Barbara Bennett is a 65 y.o. female who presents for Medicare Annual (Subsequent) preventive examination.  I connected with  BEAUTIFUL PENSYL on 10/04/21 by an audio enabled telemedicine application and verified that I am speaking with the correct person using two identifiers.  I discussed the limitations, risks, security and privacy concerns of performing an evaluation and management service by telephone and the availability of in person appointments. I also discussed with the patient that there may be a patient responsible charge related to this service. The patient expressed understanding and verbally consented to this telephonic visit.   Review of Systems           Objective:    There were no vitals filed for this visit. There is no height or weight on file to calculate BMI.  Advanced Directives 12/30/2020 04/28/2016 03/31/2016 03/25/2016 04/04/2012  Does Patient Have a Medical Advance Directive? No No No No Patient does not have advance directive  Would patient like information on creating a medical advance directive? No - Patient declined No - patient declined information No - patient declined information No - patient declined information -    Current Medications (verified) Outpatient Encounter Medications as of 10/04/2021  Medication Sig   atorvastatin (LIPITOR) 10 MG tablet Take 1 tablet (10 mg total) by mouth daily.   Cinnamon 500 MG capsule Take 1,000 mg by mouth daily.   ferrous sulfate 325 (65 FE) MG tablet Take 325 mg by mouth daily with breakfast.   ibuprofen (ADVIL,MOTRIN) 200 MG tablet Take 200-400 mg by mouth every 6 (six) hours as needed for moderate pain.   irbesartan (AVAPRO) 75 MG tablet TAKE 1 TABLET(75 MG) BY MOUTH DAILY   levothyroxine (SYNTHROID) 88 MCG tablet Take 1 tablet (88 mcg total) by mouth daily.   No facility-administered encounter medications on file as of 10/04/2021.    Allergies (verified) Penicillins and Sulfa antibiotics    History: Past Medical History:  Diagnosis Date   Anemia    Anemia, unspecified    Arthritis    knees   Bell's palsy    Hyperlipidemia, unspecified    Hypothyroidism, unspecified    Obesity, unspecified    Type 2 diabetes mellitus without complications (HCC)    no longer on medications, previously on metformin for borderline DM, A1C 5.6   Past Surgical History:  Procedure Laterality Date   CATARACT EXTRACTION W/PHACO Left 03/31/2016   Procedure: CATARACT EXTRACTION PHACO AND INTRAOCULAR LENS PLACEMENT (IOC);  Surgeon: Gemma Payor, MD;  Location: [facility];  Service: Ophthalmology;  Laterality: Left;  CDE: 6.45   CATARACT EXTRACTION W/PHACO Right 04/28/2016   Procedure: CATARACT EXTRACTION PHACO AND INTRAOCULAR LENS PLACEMENT (IOC);  Surgeon: Gemma Payor, MD;  Location: [facility];  Service: Ophthalmology;  Laterality: Right;  CDE 5.00   COLONOSCOPY N/A 12/30/2020   Procedure: COLONOSCOPY;  Surgeon: Corbin Ade, MD;  Location: [facility];  Service: Endoscopy;  Laterality: N/A;  9:30am   KNEE ARTHROSCOPY Left    Family History  Problem Relation Age of Onset   Diabetes Mother    Heart disease Mother    Heart disease Father    Diabetes Sister    Renal Disease Sister        Dialysis   Asthma Sister    Hypertension Sister    Diabetes Sister    Arthritis Sister    Diabetes Sister    Renal Disease Sister        Dialysis  Hypertension Sister    Migraines Daughter    Pneumonia Son    Colon cancer Neg Hx    Sickle cell anemia Neg Hx    Thalassemia Neg Hx    Leukemia Neg Hx    Social History   Socioeconomic History   Marital status: Married    Spouse name: Not on file   Number of children: 2   Years of education: 12   Highest education level: Not on file  Occupational History   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      Son deceased   Daughter lives nearby   Social Determinants of Health   Financial Resource Strain: Not on file  Food Insecurity: Not on file  Transportation Needs: Not on file  Physical Activity: Not on file  Stress: Not on file  Social Connections: Not on file    Tobacco Counseling Counseling given: Not Answered   Clinical Intake:              How often do you need to have someone help you when you read instructions, pamphlets, or other written materials from your doctor or pharmacy?: (P) 1 - Never  Diabetic?Nutrition Risk Assessment:  Has the patient had any N/V/D within the last 2 months?  No  Does the patient have any non-healing wounds?  No  Has the patient had any unintentional weight loss or weight gain?  No   Diabetes:  Is the patient diabetic?  Yes  If diabetic, was a CBG obtained today?  No  Did the patient bring in their glucometer from home?  No  How often do you monitor your CBG's? Doesn't check.   Financial Strains and Diabetes Management:  Are you having any financial strains with the device, your supplies or your medication? No .  Does the patient want to be seen by Chronic Care Management for management of their diabetes?  No  Would the patient like to be referred to a Nutritionist or for Diabetic Management?  No   Diabetic Exams:  Diabetic Eye Exam: Completed [facility] [place]. Overdue for diabetic eye exam. Pt has been advised about the importance in completing this exam. A referral has been placed today. Message sent to referral coordinator for scheduling purposes. Advised pt to expect a call from office referred to regarding appt.  Diabetic Foot Exam:  Pt has been advised about the importance in completing this exam. Pt is scheduled  for diabetic foot exam on next appointment.           Activities of Daily Living In your present state of health, do you have any difficulty performing the following activities: 09/30/2021  Hearing? N  Vision? N  Difficulty concentrating or making decisions? N  Walking or climbing stairs? N  Dressing or bathing? N  Doing errands, shopping? N  Preparing Food and eating ? N  Using the Toilet? N  In the past six months, have you accidently leaked urine? N  Do you have problems with loss of bowel control? N  Managing your Medications? N  Managing your Finances? N  Housekeeping or  managing your Housekeeping? N  Some recent data might be hidden    Patient Care Team: Heather Roberts, NP as PCP - General (Nurse Practitioner) Corbin Ade, MD as Consulting Physician (Gastroenterology)  Indicate any recent Medical Services you may have received from other than [facility] providers in the past year (date may be approximate).     Assessment:   This is a routine wellness examination for [place].  Hearing/Vision screen No results found.  Dietary issues and exercise activities discussed:     Goals Addressed   None   Depression Screen PHQ 2/9 Scores 08/24/2021 04/05/2021 01/05/2021 11/04/2020  PHQ - 2 Score 0 0 0 0  PHQ- 9 Score - 0 0 -    Fall Risk Fall Risk  09/30/2021 08/24/2021 04/05/2021 01/05/2021  Falls in the past year? 0 0 0 0  Number falls in past yr: 0 0 - -  Injury with Fall? 0 0 - -  Risk for fall due to : - No Fall Risks - -  Follow up - Falls evaluation completed - -    FALL RISK PREVENTION PERTAINING TO THE HOME:  Any stairs in or around the home? No  If so, are there any without handrails? No  Home free of loose throw rugs in walkways, pet beds, electrical cords, etc? Yes Adequate lighting in your home to reduce risk of falls? Yes   ASSISTIVE DEVICES UTILIZED TO PREVENT FALLS:  Life alert? No  Use of a cane, walker or w/c? No  Grab bars in the bathroom? No   Shower chair or bench in shower? No  Elevated toilet seat or a handicapped toilet? No   TIMED UP AND GO:  Was the test performed? No .  Length of time to ambulate 10 feet: NA sec.     Cognitive Function:        Immunizations Immunization History  Administered Date(s) Administered   Fluad Quad(high Dose 65+) 09/29/2020, 08/24/2021   Moderna Sars-Covid-2 Vaccination 01/15/2020, 02/12/2020, 11/09/2020   Pneumococcal Conjugate-13 11/04/2020   Pneumococcal Polysaccharide-23 08/06/2019    TDAP status: Due, Education has been provided regarding the importance of this vaccine. Advised may receive this vaccine at local pharmacy or Health Dept. Aware to provide a copy of the vaccination record if obtained from local pharmacy or Health Dept. Verbalized acceptance and understanding.  Flu Vaccine status: Up to date  Pneumococcal vaccine status: Up to date  Covid-19 vaccine status: Completed vaccines  Qualifies for Shingles Vaccine? Yes   Zostavax completed No   Shingrix Completed?: No.    Education has been provided regarding the importance of this vaccine. Patient has been advised to call insurance company to determine out of pocket expense if they have not yet received this vaccine. Advised may also receive vaccine at local pharmacy or Health Dept. Verbalized acceptance and understanding.  Screening Tests Health Maintenance  Topic Date Due   Hepatitis C Screening  Never done   Zoster Vaccines- Shingrix (1 of 2) Never done   MAMMOGRAM  Never done   DEXA SCAN  Never done   COVID-19 Vaccine (4 - Booster for Moderna series) 01/04/2021   TETANUS/TDAP  08/24/2022 (Originally 04/14/1975)   COLONOSCOPY (Pts 45-56yrs Insurance coverage will need to be confirmed)  12/30/2030   Pneumonia Vaccine 11+ Years old  Completed   INFLUENZA VACCINE  Completed   HPV VACCINES  Aged Out   FOOT EXAM  Discontinued   HEMOGLOBIN A1C  Discontinued   OPHTHALMOLOGY EXAM  Discontinued  Health  Maintenance  Health Maintenance Due  Topic Date Due   Hepatitis C Screening  Never done   Zoster Vaccines- Shingrix (1 of 2) Never done   MAMMOGRAM  Never done   DEXA SCAN  Never done   COVID-19 Vaccine (4 - Booster for Moderna series) 01/04/2021    Colorectal cancer screening: Type of screening: Colonoscopy. Completed 12-30-20. Repeat every 10 years  Mammogram status: Ordered 10-04-21. Pt provided with contact info and advised to call to schedule appt.   Bone Density status: Ordered 10-04-21. Pt provided with contact info and advised to call to schedule appt.  Lung Cancer Screening: (Low Dose CT Chest recommended if Age 66-80 years, 30 pack-year currently smoking OR have quit w/in 15years.) does not qualify.   Lung Cancer Screening Referral: NA  Additional Screening:  Hepatitis C Screening: does qualify; ordered  Vision Screening: Recommended annual ophthalmology exams for early detection of glaucoma and other disorders of the eye. Is the patient up to date with their annual eye exam?  Yes  Who is the provider or what is the name of the office in which the patient attends annual eye exams? [facility]  If pt is not established with a provider, would they like to be referred to a provider to establish care? No .   Dental Screening: Recommended annual dental exams for proper oral hygiene  Community Resource Referral / Chronic Care Management: CRR required this visit?  No   CCM required this visit?  No      Plan:     I have personally reviewed and noted the following in the patient's chart:   Medical and social history Use of alcohol, tobacco or illicit drugs  Current medications and supplements including opioid prescriptions.  Functional ability and status Nutritional status Physical activity Advanced directives List of other physicians Hospitalizations, surgeries, and ER visits in previous 12 months Vitals Screenings to include cognitive, depression, and  falls Referrals and appointments  In addition, I have reviewed and discussed with patient certain preventive protocols, quality metrics, and best practice recommendations. A written personalized care plan for preventive services as well as general preventive health recommendations were provided to patient.     Park Breed, CMA   10/04/2021   Nurse Notes: This was a telehealth visit. The patient was at home. The provider was in the office and was Trena Platt, MD.

## 2021-10-04 NOTE — Patient Instructions (Signed)
Barbara Bennett , Thank you for taking time to come for your Medicare Wellness Visit. I appreciate your ongoing commitment to your health goals. Please review the following plan we discussed and let me know if I can assist you in the future.   Screening recommendations/referrals: Colonoscopy: Due 12-30-30 Mammogram: ordered Bone Density: ordered Recommended yearly ophthalmology/optometry visit for glaucoma screening and checkup Recommended yearly dental visit for hygiene and checkup  Vaccinations: Influenza vaccine: Completed Pneumococcal vaccine: Completed Tdap vaccine: Due now Shingles vaccine: Due now    Advanced directives: patient declined information  Conditions/risks identified: hypertension  Next appointment: 1 year   Preventive Care 95 Years and Older, Female Preventive care refers to lifestyle choices and visits with your health care provider that can promote health and wellness. What does preventive care include? A yearly physical exam. This is also called an annual well check. Dental exams once or twice a year. Routine eye exams. Ask your health care provider how often you should have your eyes checked. Personal lifestyle choices, including: Daily care of your teeth and gums. Regular physical activity. Eating a healthy diet. Avoiding tobacco and drug use. Limiting alcohol use. Practicing safe sex. Taking low-dose aspirin every day. Taking vitamin and mineral supplements as recommended by your health care provider. What happens during an annual well check? The services and screenings done by your health care provider during your annual well check will depend on your age, overall health, lifestyle risk factors, and family history of disease. Counseling  Your health care provider may ask you questions about your: Alcohol use. Tobacco use. Drug use. Emotional well-being. Home and relationship well-being. Sexual activity. Eating habits. History of falls. Memory and  ability to understand (cognition). Work and work Astronomer. Reproductive health. Screening  You may have the following tests or measurements: Height, weight, and BMI. Blood pressure. Lipid and cholesterol levels. These may be checked every 5 years, or more frequently if you are over 58 years old. Skin check. Lung cancer screening. You may have this screening every year starting at age 23 if you have a 30-pack-year history of smoking and currently smoke or have quit within the past 15 years. Fecal occult blood test (FOBT) of the stool. You may have this test every year starting at age 52. Flexible sigmoidoscopy or colonoscopy. You may have a sigmoidoscopy every 5 years or a colonoscopy every 10 years starting at age 16. Hepatitis C blood test. Hepatitis B blood test. Sexually transmitted disease (STD) testing. Diabetes screening. This is done by checking your blood sugar (glucose) after you have not eaten for a while (fasting). You may have this done every 1-3 years. Bone density scan. This is done to screen for osteoporosis. You may have this done starting at age 46. Mammogram. This may be done every 1-2 years. Talk to your health care provider about how often you should have regular mammograms. Talk with your health care provider about your test results, treatment options, and if necessary, the need for more tests. Vaccines  Your health care provider may recommend certain vaccines, such as: Influenza vaccine. This is recommended every year. Tetanus, diphtheria, and acellular pertussis (Tdap, Td) vaccine. You may need a Td booster every 10 years. Zoster vaccine. You may need this after age 35. Pneumococcal 13-valent conjugate (PCV13) vaccine. One dose is recommended after age 92. Pneumococcal polysaccharide (PPSV23) vaccine. One dose is recommended after age 27. Talk to your health care provider about which screenings and vaccines you need and how often you  need them. This information is  not intended to replace advice given to you by your health care provider. Make sure you discuss any questions you have with your health care provider. Document Released: 12/18/2015 Document Revised: 08/10/2016 Document Reviewed: 09/22/2015 Elsevier Interactive Patient Education  2017 [place] Prevention in the Home Falls can cause injuries. They can happen to people of all ages. There are many things you can do to make your home safe and to help prevent falls. What can I do on the outside of my home? Regularly fix the edges of walkways and driveways and fix any cracks. Remove anything that might make you trip as you walk through a door, such as a raised step or threshold. Trim any bushes or trees on the path to your home. Use bright outdoor lighting. Clear any walking paths of anything that might make someone trip, such as rocks or tools. Regularly check to see if handrails are loose or broken. Make sure that both sides of any steps have handrails. Any raised decks and porches should have guardrails on the edges. Have any leaves, snow, or ice cleared regularly. Use sand or salt on walking paths during winter. Clean up any spills in your garage right away. This includes oil or grease spills. What can I do in the bathroom? Use night lights. Install grab bars by the toilet and in the tub and shower. Do not use towel bars as grab bars. Use non-skid mats or decals in the tub or shower. If you need to sit down in the shower, use a plastic, non-slip stool. Keep the floor dry. Clean up any water that spills on the floor as soon as it happens. Remove soap buildup in the tub or shower regularly. Attach bath mats securely with double-sided non-slip rug tape. Do not have throw rugs and other things on the floor that can make you trip. What can I do in the bedroom? Use night lights. Make sure that you have a light by your bed that is easy to reach. Do not use any sheets or blankets that  are too big for your bed. They should not hang down onto the floor. Have a firm chair that has side arms. You can use this for support while you get dressed. Do not have throw rugs and other things on the floor that can make you trip. What can I do in the kitchen? Clean up any spills right away. Avoid walking on wet floors. Keep items that you use a lot in easy-to-reach places. If you need to reach something above you, use a strong step stool that has a grab bar. Keep electrical cords out of the way. Do not use floor polish or wax that makes floors slippery. If you must use wax, use non-skid floor wax. Do not have throw rugs and other things on the floor that can make you trip. What can I do with my stairs? Do not leave any items on the stairs. Make sure that there are handrails on both sides of the stairs and use them. Fix handrails that are broken or loose. Make sure that handrails are as long as the stairways. Check any carpeting to make sure that it is firmly attached to the stairs. Fix any carpet that is loose or worn. Avoid having throw rugs at the top or bottom of the stairs. If you do have throw rugs, attach them to the floor with carpet tape. Make sure that you have a light switch  at the top of the stairs and the bottom of the stairs. If you do not have them, ask someone to add them for you. What else can I do to help prevent falls? Wear shoes that: Do not have high heels. Have rubber bottoms. Are comfortable and fit you well. Are closed at the toe. Do not wear sandals. If you use a stepladder: Make sure that it is fully opened. Do not climb a closed stepladder. Make sure that both sides of the stepladder are locked into place. Ask someone to hold it for you, if possible. Clearly mark and make sure that you can see: Any grab bars or handrails. First and last steps. Where the edge of each step is. Use tools that help you move around (mobility aids) if they are needed. These  include: Canes. Walkers. Scooters. Crutches. Turn on the lights when you go into a dark area. Replace any light bulbs as soon as they burn out. Set up your furniture so you have a clear path. Avoid moving your furniture around. If any of your floors are uneven, fix them. If there are any pets around you, be aware of where they are. Review your medicines with your doctor. Some medicines can make you feel dizzy. This can increase your chance of falling. Ask your doctor what other things that you can do to help prevent falls. This information is not intended to replace advice given to you by your health care provider. Make sure you discuss any questions you have with your health care provider. Document Released: 09/17/2009 Document Revised: 04/28/2016 Document Reviewed: 12/26/2014 Elsevier Interactive Patient Education  2017 [employer].

## 2021-10-15 LAB — TSH: TSH: 0.267 u[IU]/mL — ABNORMAL LOW (ref 0.450–4.500)

## 2021-10-15 LAB — T4, FREE: Free T4: 1.51 ng/dL (ref 0.82–1.77)

## 2021-10-15 LAB — THYROGLOBULIN ANTIBODY: Thyroglobulin Antibody: 339.3 IU/mL — ABNORMAL HIGH (ref 0.0–0.9)

## 2021-10-15 LAB — THYROID PEROXIDASE ANTIBODY: Thyroperoxidase Ab SerPl-aCnc: 14 IU/mL (ref 0–34)

## 2021-10-15 LAB — T3, FREE: T3, Free: 2.4 pg/mL (ref 2.0–4.4)

## 2021-10-19 ENCOUNTER — Ambulatory Visit (INDEPENDENT_AMBULATORY_CARE_PROVIDER_SITE_OTHER): Payer: Medicare Other | Admitting: Nurse Practitioner

## 2021-10-19 ENCOUNTER — Other Ambulatory Visit: Payer: Self-pay

## 2021-10-19 ENCOUNTER — Encounter: Payer: Self-pay | Admitting: Nurse Practitioner

## 2021-10-19 VITALS — BP 132/86 | HR 77 | Ht 60.0 in | Wt 195.4 lb

## 2021-10-19 DIAGNOSIS — E038 Other specified hypothyroidism: Secondary | ICD-10-CM

## 2021-10-19 DIAGNOSIS — E063 Autoimmune thyroiditis: Secondary | ICD-10-CM | POA: Diagnosis not present

## 2021-10-19 NOTE — Patient Instructions (Signed)

## 2021-10-19 NOTE — Progress Notes (Signed)
Endocrinology Follow Up Note                                         10/19/2021, 9:44 AM  Subjective:   Subjective    Barbara Bennett is a 65 y.o.-year-old female patient being seen in follow up after being seen in consultation for hypothyroidism referred by Heather Roberts, NP.  She was a patient of Dr. Karilyn Cota.   Past Medical History:  Diagnosis Date   Anemia    Anemia, unspecified    Arthritis    knees   Bell's palsy    Hyperlipidemia, unspecified    Hypothyroidism, unspecified    Obesity, unspecified    Type 2 diabetes mellitus without complications (HCC)    no longer on medications, previously on metformin for borderline DM, A1C 5.6    Past Surgical History:  Procedure Laterality Date   CATARACT EXTRACTION W/PHACO Left 03/31/2016   Procedure: CATARACT EXTRACTION PHACO AND INTRAOCULAR LENS PLACEMENT ([facility]);  Surgeon: Gemma Payor, MD;  Location: [facility];  Service: Ophthalmology;  Laterality: Left;  CDE: 6.45   CATARACT EXTRACTION W/PHACO Right 04/28/2016   Procedure: CATARACT EXTRACTION PHACO AND INTRAOCULAR LENS PLACEMENT ([facility]);  Surgeon: Gemma Payor, MD;  Location: [facility];  Service: Ophthalmology;  Laterality: Right;  CDE 5.00   COLONOSCOPY N/A 12/30/2020   Procedure: COLONOSCOPY;  Surgeon: Corbin Ade, MD;  Location[facility];  Service: Endoscopy;  Laterality: N/A;  9:30am   KNEE ARTHROSCOPY Left     Social History   Socioeconomic History   Marital status: Married    Spouse name: Not on file   Number of children: 2   Years of education: 12   Highest education level: Not on file  Occupational History   Occupation: Retired- worked at Clinical [employer]  Tobacco Use   Smoking status: Never   Smokeless tobacco: Never  Vaping Use   Vaping Use: Never used  Substance and Sexual Activity   Alcohol use: No   Drug use: No   Sexual activity: Not Currently  Other Topics Concern   Not on file  Social  History Narrative   Married for 26 years,second.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      Son deceased   Daughter lives nearby   Social Determinants of Health   Financial Resource Strain: Low Risk    Difficulty of Paying Living Expenses: Not hard at all  Food Insecurity: No Food Insecurity   Worried About Programme researcher, broadcasting/film/video in the Last Year: Never true   Barista in the Last Year: Never true  Transportation Needs: No Transportation Needs   Lack of Transportation (Medical): No   Lack of Transportation (Non-Medical): No  Physical Activity: Sufficiently Active   Days of Exercise per Week: 5 days   Minutes of Exercise per Session: 30 min  Stress: No Stress Concern Present   Feeling of Stress : Not at all  Social Connections: Moderately Integrated   Frequency of Communication with Friends  and Family: More than three times a week   Frequency of Social Gatherings with Friends and Family: More than three times a week   Attends Religious Services: More than 4 times per year   Active Member of [employer] or Organizations: No   Attends Banker Meetings: Never   Marital Status: Married    Family History  Problem Relation Age of Onset   Diabetes Mother    Heart disease Mother    Heart disease Father    Diabetes Sister    Renal Disease Sister        Dialysis   Asthma Sister    Hypertension Sister    Diabetes Sister    Arthritis Sister    Diabetes Sister    Renal Disease Sister        Dialysis   Hypertension Sister    Migraines Daughter    Pneumonia Son    Colon cancer Neg Hx    Sickle cell anemia Neg Hx    Thalassemia Neg Hx    Leukemia Neg Hx     Outpatient Encounter Medications as of 10/19/2021  Medication Sig   atorvastatin (LIPITOR) 10 MG tablet Take 1 tablet (10 mg total) by mouth daily.   Cinnamon 500 MG capsule Take 1,000 mg by mouth daily.   ferrous sulfate 325 (65 FE) MG tablet Take 325 mg by mouth daily with breakfast.   ibuprofen  (ADVIL,MOTRIN) 200 MG tablet Take 200-400 mg by mouth every 6 (six) hours as needed for moderate pain.   irbesartan (AVAPRO) 75 MG tablet TAKE 1 TABLET(75 MG) BY MOUTH DAILY   levothyroxine (SYNTHROID) 88 MCG tablet Take 1 tablet (88 mcg total) by mouth daily.   No facility-administered encounter medications on file as of 10/19/2021.    ALLERGIES: Allergies  Allergen Reactions   Penicillins Rash    Has patient had a PCN reaction causing immediate rash, facial/tongue/throat swelling, SOB or lightheadedness with hypotension: Yes Has patient had a PCN reaction causing severe rash involving mucus membranes or skin necrosis: No Has patient had a PCN reaction that required hospitalization No Has patient had a PCN reaction occurring within the last 10 years: Yes If all of the above answers are "NO", then may proceed with Cephalosporin use.    Sulfa Antibiotics Rash   VACCINATION STATUS: Immunization History  Administered Date(s) Administered   Fluad Quad(high Dose 65+) 09/29/2020, 08/24/2021   Moderna Sars-Covid-2 Vaccination 01/15/2020, 02/12/2020, 11/09/2020   Pneumococcal Conjugate-13 11/04/2020   Pneumococcal Polysaccharide-23 08/06/2019     HPI   Barbara Bennett  is a patient with the above medical history. she was diagnosed  with hypothyroidism at approximate age of 75 years, which required subsequent initiation of thyroid hormone replacement. she was given various doses of Levothyroxine currently on 88 mcg. she reports compliance to this medication:  Taking it daily, on an empty stomach at least 30 minutes prior to eating/drinking or taking other medications, waiting 4 hrs before taking Calcium supplement, MVIs, or acid reflux medications.   I reviewed patient's thyroid tests:  Lab Results  Component Value Date   TSH 0.267 (L) 10/14/2021   TSH 0.02 (L) 01/05/2021   TSH 5.58 (H) 11/04/2020   TSH 4.43 08/19/2020   TSH 5.74 06/06/2018   FREET4 1.51 10/14/2021   FREET4 1.2  08/19/2020     Pt denies feeling nodules in neck, hoarseness, dysphagia/odynophagia, SOB with lying down.  she denies family history of thyroid disorders.  No family history of thyroid  cancer.  No history of radiation therapy to head or neck.  No recent use of iodine supplements.  She does take a Womens MVI with Biotin in it.   I reviewed her chart and she also has a history of DM 2, HTN, HLD, IBS.   Review of systems  Constitutional: + Minimally fluctuating body weight,  current Body mass index is 38.16 kg/m. , no fatigue, no subjective hyperthermia, no subjective hypothermia Eyes: no blurry vision, no xerophthalmia ENT: no sore throat, no nodules palpated in throat, no dysphagia/odynophagia, no hoarseness Cardiovascular: no chest pain, no shortness of breath, no palpitations, no leg swelling Respiratory: no cough, no shortness of breath Gastrointestinal: no nausea/vomiting/diarrhea Musculoskeletal: no muscle/joint aches Skin: no rashes, no hyperemia Neurological: no tremors, no numbness, no tingling, no dizziness Psychiatric: no depression, no anxiety   Objective:   Objective     BP 132/86   Pulse 77   Ht 5' (1.524 m)   Wt 195 lb 6.4 oz (88.6 kg)   LMP 04/01/2012   SpO2 99%   BMI 38.16 kg/m  Wt Readings from Last 3 Encounters:  10/19/21 195 lb 6.4 oz (88.6 kg)  08/24/21 194 lb (88 kg)  08/19/21 193 lb 12.8 oz (87.9 kg)    BP Readings from Last 3 Encounters:  10/19/21 132/86  08/24/21 135/75  08/19/21 110/74      Physical Exam- Limited  Constitutional:  Body mass index is 38.16 kg/m. , not in acute distress, normal state of mind Eyes:  EOMI, no exophthalmos Neck: Supple Cardiovascular: RRR, no murmurs, rubs, or gallops, no edema Respiratory: Adequate breathing efforts, no crackles, rales, rhonchi, or wheezing Musculoskeletal: no gross deformities, strength intact in all four extremities, no gross restriction of joint movements Skin:  no rashes, no  hyperemia Neurological: no tremor with outstretched hands   CMP ( most recent) CMP     Component Value Date/Time   NA 139 04/05/2021 1420   NA 139 06/06/2018 0000   K 4.0 04/05/2021 1420   CL 105 04/05/2021 1420   CO2 25 04/05/2021 1420   GLUCOSE 115 04/05/2021 1420   BUN 17 04/05/2021 1420   BUN 16 06/06/2018 0000   CREATININE 1.03 (H) 04/05/2021 1420   CALCIUM 9.1 04/05/2021 1420   PROT 7.2 01/05/2021 1313   ALBUMIN 3.8 06/06/2018 0000   AST 17 01/05/2021 1313   ALT 14 01/05/2021 1313   BILITOT 0.5 01/05/2021 1313   GFRNONAA 51 (L) 01/05/2021 1313   GFRAA 59 (L) 01/05/2021 1313     Diabetic Labs (most recent): Lab Results  Component Value Date   HGBA1C 5.4 08/19/2020   HGBA1C 5.9 06/06/2018     Lipid Panel ( most recent) Lipid Panel     Component Value Date/Time   CHOL 155 08/19/2020 0818   TRIG 118 08/19/2020 0818   HDL 42 (L) 08/19/2020 0818   CHOLHDL 3.7 08/19/2020 0818   LDLCALC 92 08/19/2020 0818       Lab Results  Component Value Date   TSH 0.267 (L) 10/14/2021   TSH 0.02 (L) 01/05/2021   TSH 5.58 (H) 11/04/2020   TSH 4.43 08/19/2020   TSH 5.74 06/06/2018   FREET4 1.51 10/14/2021   FREET4 1.2 08/19/2020      Assessment & Plan:   ASSESSMENT / PLAN:  1. Hypothyroidism- r/t Hashimoto's thyroiditis   Patient with relatively new hypothyroidism (diagnosed last year), was on NP Thyroid therapy (switched to levothyroxine at last visit). On physical exam, patient does  not have gross goiter, thyroid nodules, or neck compression symptoms.  Her antibody tests showed positive Thyroglobulin antibodies helping to classify her thyroid dysfunction as Hashimoto's thyroiditis.  Her previsit thyroid function tests are consistent with appropriate hormone replacement normal FT4 and slightly low TSH (acceptable for the winter months).  She is advised to continue Levothyroxine 88 mcg po daily before breakfast.  - We discussed about correct intake of  levothyroxine, at fasting, with water, separated by at least 30 minutes from breakfast, and separated by more than 4 hours from calcium, iron, multivitamins, acid reflux medications (PPIs). -Patient is made aware of the fact that thyroid hormone replacement is needed for life, dose to be adjusted by periodic monitoring of thyroid function tests. - Will check thyroid tests before next visit: TSH, free T4, FT3, and antibody testing to assess for autoimmune thyroid dysfunction which will help classify her hypothyroidism further.  -Due to absence of clinical goiter, no need for thyroid ultrasound.   I spent 20 minutes in the care of the patient today including review of labs from Thyroid Function, CMP, and other relevant labs ; imaging/biopsy records (current and previous including abstractions from other facilities); face-to-face time discussing  her lab results and symptoms, medications doses, her options of short and long term treatment based on the latest standards of care / guidelines;   and documenting the encounter.  Telford Nab Scalf  participated in the discussions, expressed understanding, and voiced agreement with the above plans.  All questions were answered to her satisfaction. she is encouraged to contact clinic should she have any questions or concerns prior to her return visit.   FOLLOW UP PLAN:  Return in about 4 months (around 02/16/2022) for Thyroid follow up, Previsit labs.  Ronny Bacon, [facility] [address] [place], [place] [address] Phone: [phone] Fax: [phone]  10/19/2021, 9:44 AM

## 2021-10-29 ENCOUNTER — Ambulatory Visit: Payer: Medicare Other | Admitting: Nurse Practitioner

## 2021-12-01 ENCOUNTER — Other Ambulatory Visit: Payer: Self-pay

## 2021-12-01 ENCOUNTER — Encounter: Payer: Self-pay | Admitting: Nurse Practitioner

## 2021-12-01 ENCOUNTER — Ambulatory Visit (INDEPENDENT_AMBULATORY_CARE_PROVIDER_SITE_OTHER): Payer: Medicare Other | Admitting: Nurse Practitioner

## 2021-12-01 VITALS — BP 163/88 | HR 84 | Ht 60.0 in | Wt 193.1 lb

## 2021-12-01 DIAGNOSIS — D508 Other iron deficiency anemias: Secondary | ICD-10-CM | POA: Diagnosis not present

## 2021-12-01 DIAGNOSIS — E785 Hyperlipidemia, unspecified: Secondary | ICD-10-CM

## 2021-12-01 DIAGNOSIS — M7989 Other specified soft tissue disorders: Secondary | ICD-10-CM | POA: Insufficient documentation

## 2021-12-01 DIAGNOSIS — I10 Essential (primary) hypertension: Secondary | ICD-10-CM | POA: Diagnosis not present

## 2021-12-01 DIAGNOSIS — E038 Other specified hypothyroidism: Secondary | ICD-10-CM

## 2021-12-01 DIAGNOSIS — E119 Type 2 diabetes mellitus without complications: Secondary | ICD-10-CM

## 2021-12-01 DIAGNOSIS — E063 Autoimmune thyroiditis: Secondary | ICD-10-CM

## 2021-12-01 LAB — CBC WITH DIFFERENTIAL/PLATELET
Basophils Absolute: 0.1 10*3/uL (ref 0.0–0.2)
Basos: 1 %
EOS (ABSOLUTE): 0.2 10*3/uL (ref 0.0–0.4)
Eos: 4 %
Hematocrit: 33.6 % — ABNORMAL LOW (ref 34.0–46.6)
Hemoglobin: 11.2 g/dL (ref 11.1–15.9)
Immature Grans (Abs): 0 10*3/uL (ref 0.0–0.1)
Immature Granulocytes: 0 %
Lymphocytes Absolute: 2.2 10*3/uL (ref 0.7–3.1)
Lymphs: 40 %
MCH: 27.5 pg (ref 26.6–33.0)
MCHC: 33.3 g/dL (ref 31.5–35.7)
MCV: 83 fL (ref 79–97)
Monocytes Absolute: 0.4 10*3/uL (ref 0.1–0.9)
Monocytes: 8 %
Neutrophils Absolute: 2.6 10*3/uL (ref 1.4–7.0)
Neutrophils: 47 %
Platelets: 171 10*3/uL (ref 150–450)
RBC: 4.07 x10E6/uL (ref 3.77–5.28)
RDW: 12 % (ref 11.7–15.4)
WBC: 5.6 10*3/uL (ref 3.4–10.8)

## 2021-12-01 LAB — CMP14+EGFR
ALT: 11 IU/L (ref 0–32)
AST: 17 IU/L (ref 0–40)
Albumin/Globulin Ratio: 1.4 (ref 1.2–2.2)
Albumin: 4.1 g/dL (ref 3.8–4.8)
Alkaline Phosphatase: 54 IU/L (ref 44–121)
BUN/Creatinine Ratio: 18 (ref 12–28)
BUN: 20 mg/dL (ref 8–27)
Bilirubin Total: 0.4 mg/dL (ref 0.0–1.2)
CO2: 21 mmol/L (ref 20–29)
Calcium: 9 mg/dL (ref 8.7–10.3)
Chloride: 109 mmol/L — ABNORMAL HIGH (ref 96–106)
Creatinine, Ser: 1.1 mg/dL — ABNORMAL HIGH (ref 0.57–1.00)
Globulin, Total: 2.9 g/dL (ref 1.5–4.5)
Glucose: 122 mg/dL — ABNORMAL HIGH (ref 70–99)
Potassium: 4.3 mmol/L (ref 3.5–5.2)
Sodium: 142 mmol/L (ref 134–144)
Total Protein: 7 g/dL (ref 6.0–8.5)
eGFR: 55 mL/min/{1.73_m2} — ABNORMAL LOW (ref >59)

## 2021-12-01 LAB — LIPID PANEL WITH LDL/HDL RATIO
Cholesterol, Total: 178 mg/dL (ref 100–199)
HDL: 37 mg/dL — ABNORMAL LOW (ref >39)
LDL Chol Calc (NIH): 117 mg/dL — ABNORMAL HIGH (ref 0–99)
LDL/HDL Ratio: 3.2 ratio (ref 0.0–3.2)
Triglycerides: 134 mg/dL (ref 0–149)
VLDL Cholesterol Cal: 24 mg/dL (ref 5–40)

## 2021-12-01 LAB — IRON,TIBC AND FERRITIN PANEL
Ferritin: 597 ng/mL — ABNORMAL HIGH (ref 15–150)
Iron Saturation: 34 % (ref 15–55)
Iron: 87 ug/dL (ref 27–139)
Total Iron Binding Capacity: 255 ug/dL (ref 250–450)
UIBC: 168 ug/dL (ref 118–369)

## 2021-12-01 LAB — HEMOGLOBIN A1C
Est. average glucose Bld gHb Est-mCnc: 128 mg/dL
Hgb A1c MFr Bld: 6.1 % — ABNORMAL HIGH (ref 4.8–5.6)

## 2021-12-01 MED ORDER — HYDROCHLOROTHIAZIDE 25 MG PO TABS: 25.0000 mg | ORAL_TABLET | Freq: Every day | ORAL | 3 refills | Status: DC

## 2021-12-01 MED ORDER — ATORVASTATIN CALCIUM 20 MG PO TABS: 20.0000 mg | ORAL_TABLET | Freq: Every day | ORAL | 3 refills | Status: DC

## 2021-12-01 NOTE — Assessment & Plan Note (Signed)
-  she states Dr. Karilyn Cota took her off metformin and she has been diet controlled Lab Results  Component Value Date   HGBA1C 6.1 (H) 11/30/2021   -no meds added today since she is on ARB and statin and A1c is at goal

## 2021-12-01 NOTE — Progress Notes (Signed)
Acute Office Visit  Subjective:    Patient ID: Barbara Bennett, female    DOB: July 26, 1956, 65 y.o.   MRN: [id number]  Chief Complaint  Patient presents with   Follow-up    2 month follow up ankles and legs swelling when shes on her feet a lot     HPI Patient is in today for lab follow-up for anemia, T2DM, and HTN.  She states that she has some leg swelling when she is on her feet all day, but compression stocking have been helping.  Past Medical History:  Diagnosis Date   Anemia    Anemia, unspecified    Arthritis    knees   Bell's palsy    Hyperlipidemia, unspecified    Hypothyroidism, unspecified    Obesity, unspecified    Type 2 diabetes mellitus without complications (HCC)    no longer on medications, previously on metformin for borderline DM, A1C 5.6    Past Surgical History:  Procedure Laterality Date   CATARACT EXTRACTION W/PHACO Left 03/31/2016   Procedure: CATARACT EXTRACTION PHACO AND INTRAOCULAR LENS PLACEMENT ([place]);  Surgeon: Tonny Branch, MD;  Location: [facility];  Service: Ophthalmology;  Laterality: Left;  CDE: 6.45   CATARACT EXTRACTION W/PHACO Right 04/28/2016   Procedure: CATARACT EXTRACTION PHACO AND INTRAOCULAR LENS PLACEMENT (IOC);  Surgeon: Tonny Branch, MD;  Location: [facility];  Service: Ophthalmology;  Laterality: Right;  CDE 5.00   COLONOSCOPY N/A 12/30/2020   Procedure: COLONOSCOPY;  Surgeon: Daneil Dolin, MD;  [facility];  Service: Endoscopy;  Laterality: N/A;  9:30am   KNEE ARTHROSCOPY Left     Family History  Problem Relation Age of Onset   Diabetes Mother    Heart disease Mother    Heart disease Father    Diabetes Sister    Renal Disease Sister        Dialysis   Asthma Sister    Hypertension Sister    Diabetes Sister    Arthritis Sister    Diabetes Sister    Renal Disease Sister        Dialysis   Hypertension Sister    Migraines Daughter    Pneumonia Son    Colon cancer Neg Hx    Sickle cell anemia Neg Hx     Thalassemia Neg Hx    Leukemia Neg Hx     Social History   Socioeconomic History   Marital status: Married    Spouse name: Not on file   Number of children: 2   Years of education: 12   Highest education level: Not on file  Occupational History   Occupation: Retired- worked at [employer]  Tobacco Use   Smoking status: Never   Smokeless tobacco: Never  Vaping Use   Vaping Use: Never used  Substance and Sexual Activity   Alcohol use: No   Drug use: No   Sexual activity: Not Currently  Other Topics Concern   Not on file  Social History Narrative   Married for 60 years,second.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      Son deceased   Daughter lives nearby   Social Determinants of Health   Financial Resource Strain: Low Risk    Difficulty of Paying Living Expenses: Not hard at all  Food Insecurity: No Food Insecurity   Worried About Charity fundraiser in the Last Year: Never true   Arboriculturist in the Last Year: Never true  Transportation Needs: No Transportation Needs  Lack of Transportation (Medical): No   Lack of Transportation (Non-Medical): No  Physical Activity: Sufficiently Active   Days of Exercise per Week: 5 days   Minutes of Exercise per Session: 30 min  Stress: No Stress Concern Present   Feeling of Stress : Not at all  Social Connections: Moderately Integrated   Frequency of Communication with Friends and Family: More than three times a week   Frequency of Social Gatherings with Friends and Family: More than three times a week   Attends Religious Services: More than 4 times per year   Active Member of [employer] or Organizations: No   Attends Archivist Meetings: Never   Marital Status: Married  Human resources officer Violence: Not At Risk   Fear of Current or Ex-Partner: No   Emotionally Abused: No   Physically Abused: No   Sexually Abused: No    Outpatient Medications Prior to Visit  Medication Sig Dispense Refill   Cinnamon  500 MG capsule Take 1,000 mg by mouth daily.     ibuprofen (ADVIL,MOTRIN) 200 MG tablet Take 200-400 mg by mouth every 6 (six) hours as needed for moderate pain.     irbesartan (AVAPRO) 75 MG tablet TAKE 1 TABLET(75 MG) BY MOUTH DAILY 90 tablet 3   levothyroxine (SYNTHROID) 88 MCG tablet Take 1 tablet (88 mcg total) by mouth daily. 90 tablet 1   atorvastatin (LIPITOR) 10 MG tablet Take 1 tablet (10 mg total) by mouth daily. 90 tablet 3   ferrous sulfate 325 (65 FE) MG tablet Take 325 mg by mouth daily with breakfast.     No facility-administered medications prior to visit.    Allergies  Allergen Reactions   Penicillins Rash    Has patient had a PCN reaction causing immediate rash, facial/tongue/throat swelling, SOB or lightheadedness with hypotension: Yes Has patient had a PCN reaction causing severe rash involving mucus membranes or skin necrosis: No Has patient had a PCN reaction that required hospitalization No Has patient had a PCN reaction occurring within the last 10 years: Yes If all of the above answers are "NO", then may proceed with Cephalosporin use.    Sulfa Antibiotics Rash    Review of Systems  Constitutional: Negative.   Respiratory: Negative.    Cardiovascular: Negative.   Endocrine: Negative.   Psychiatric/Behavioral: Negative.        Objective:    Physical Exam Constitutional:      Appearance: Normal appearance. She is obese.  Cardiovascular:     Rate and Rhythm: Normal rate and regular rhythm.     Pulses: Normal pulses.     Heart sounds: Normal heart sounds.  Pulmonary:     Effort: Pulmonary effort is normal.     Breath sounds: Normal breath sounds.  Neurological:     Mental Status: She is alert.  Psychiatric:        Mood and Affect: Mood normal.        Behavior: Behavior normal.        Thought Content: Thought content normal.        Judgment: Judgment normal.    BP (!) 163/88    Pulse 84    Ht 5' (1.524 m)    Wt 193 lb 1.3 oz (87.6 kg)    LMP  04/01/2012    SpO2 97%    BMI 37.71 kg/m  Wt Readings from Last 3 Encounters:  12/01/21 193 lb 1.3 oz (87.6 kg)  10/19/21 195 lb 6.4 oz (88.6 kg)  08/24/21  194 lb (88 kg)  ° ° °Health Maintenance Due  °Topic Date Due  ° Hepatitis C Screening  Never done  ° Zoster Vaccines- Shingrix (1 of 2) Never done  ° MAMMOGRAM  Never done  ° DEXA SCAN  Never done  ° COVID-19 Vaccine (4 - Booster for Moderna series) 01/04/2021  ° ° °There are no preventive care reminders to display for this patient. ° ° °Lab Results  °Component Value Date  ° TSH 0.267 (L) 10/14/2021  ° °Lab Results  °Component Value Date  ° WBC 5.6 11/30/2021  ° HGB 11.2 11/30/2021  ° HCT 33.6 (L) 11/30/2021  ° MCV 83 11/30/2021  ° PLT 171 11/30/2021  ° °Lab Results  °Component Value Date  ° NA 142 11/30/2021  ° K 4.3 11/30/2021  ° CO2 21 11/30/2021  ° GLUCOSE 122 (H) 11/30/2021  ° BUN 20 11/30/2021  ° CREATININE 1.10 (H) 11/30/2021  ° BILITOT 0.4 11/30/2021  ° ALKPHOS 54 11/30/2021  ° AST 17 11/30/2021  ° ALT 11 11/30/2021  ° PROT 7.0 11/30/2021  ° ALBUMIN 4.1 11/30/2021  ° CALCIUM 9.0 11/30/2021  ° ANIONGAP 9 03/25/2016  ° EGFR 55 (L) 11/30/2021  ° °Lab Results  °Component Value Date  ° CHOL 178 11/30/2021  ° °Lab Results  °Component Value Date  ° HDL 37 (L) 11/30/2021  ° °Lab Results  °Component Value Date  ° LDLCALC 117 (H) 11/30/2021  ° °Lab Results  °Component Value Date  ° TRIG 134 11/30/2021  ° °Lab Results  °Component Value Date  ° CHOLHDL 3.7 08/19/2020  ° °Lab Results  °Component Value Date  ° HGBA1C 6.1 (H) 11/30/2021  ° ° °   °Assessment & Plan:  ° °Problem List Items Addressed This Visit   ° °  ° Cardiovascular and Mediastinum  ° Essential (primary) hypertension  °  BP Readings from Last 3 Encounters:  °12/01/21 (!) 163/88  °10/19/21 132/86  °08/24/21 135/75  °-BP elevated today °-Rx. HCTZ; should also help with leg swelling °  °  ° Relevant Medications  ° hydrochlorothiazide (HYDRODIURIL) 25 MG tablet  ° atorvastatin (LIPITOR) 20 MG tablet  °  Other Relevant Orders  ° CBC with Differential/Platelet  ° CMP14+EGFR  ° Lipid Panel With LDL/HDL Ratio  °  ° Endocrine  ° Hypothyroidism, unspecified  °  -followed by endocrinology °  °  ° Type 2 diabetes mellitus without complications (HCC) - Primary  °  -she states Dr. Gosrani took her off metformin and she has been diet controlled °Lab Results  °Component Value Date  ° HGBA1C 6.1 (H) 11/30/2021  ° °-no meds added today since she is on ARB and statin and A1c is at goal °  °  ° Relevant Medications  ° atorvastatin (LIPITOR) 20 MG tablet  ° Other Relevant Orders  ° CBC with Differential/Platelet  ° CMP14+EGFR  ° Lipid Panel With LDL/HDL Ratio  ° Microalbumin / creatinine urine ratio  ° Hemoglobin A1c  °  ° Other  ° Anemia, unspecified  °  Lab Results  °Component Value Date  ° IRON 87 11/30/2021  ° TIBC 255 11/30/2021  ° FERRITIN 597 (H) 11/30/2021  °-STOP iron supplement °  °  ° Relevant Orders  ° Iron, TIBC and Ferritin Panel  ° Hyperlipidemia, unspecified  °  Lab Results  °Component Value Date  ° CHOL 178 11/30/2021  ° HDL 37 (L) 11/30/2021  ° LDLCALC 117 (H) 11/30/2021  ° TRIG 134 11/30/2021  ° CHOLHDL   3.7 08/19/2020  -INCREASE atorvastatin      Relevant Medications   hydrochlorothiazide (HYDRODIURIL) 25 MG tablet   atorvastatin (LIPITOR) 20 MG tablet   Other Relevant Orders   Lipid Panel With LDL/HDL Ratio   Leg swelling    -non-pitting; worse with standing long hours, improves with compression stockings -likely some venous insufficiency -continue compression stockings, lose weight        Meds ordered this encounter  Medications   hydrochlorothiazide (HYDRODIURIL) 25 MG tablet    Sig: Take 1 tablet (25 mg total) by mouth daily.    Dispense:  90 tablet    Refill:  3   atorvastatin (LIPITOR) 20 MG tablet    Sig: Take 1 tablet (20 mg total) by mouth daily.    Dispense:  90 tablet    Refill:  [place], NP

## 2021-12-01 NOTE — Assessment & Plan Note (Addendum)
BP Readings from Last 3 Encounters:  12/01/21 (!) 163/88  10/19/21 132/86  08/24/21 135/75   -BP elevated today -Rx. HCTZ; should also help with leg swelling

## 2021-12-01 NOTE — Patient Instructions (Addendum)
Please have fasting labs drawn 2-3 days prior to your appointment so we can discuss the results during your office visit.  Check your BP at home daily. If it is consistently above 140/90, return to the clinic sooner than your next scheduled appointment.  I will be moving to [facility] located at [address] [place], [place] [address] effective Dec 05, 2021. If you would like to establish care with [facility] please call [phone].

## 2021-12-01 NOTE — Assessment & Plan Note (Signed)
Lab Results  Component Value Date   IRON 87 11/30/2021   TIBC 255 11/30/2021   FERRITIN 597 (H) 11/30/2021   -STOP iron supplement

## 2021-12-01 NOTE — Progress Notes (Signed)
Ferritin elevated, otherwise labs are stable. I'll see her today.

## 2021-12-01 NOTE — Assessment & Plan Note (Signed)
Lab Results  Component Value Date   CHOL 178 11/30/2021   HDL 37 (L) 11/30/2021   LDLCALC 117 (H) 11/30/2021   TRIG 134 11/30/2021   CHOLHDL 3.7 08/19/2020   -INCREASE atorvastatin

## 2021-12-01 NOTE — Assessment & Plan Note (Signed)
-  followed by endocrinology 

## 2021-12-01 NOTE — Assessment & Plan Note (Signed)
-  non-pitting; worse with standing long hours, improves with compression stockings -likely some venous insufficiency -continue compression stockings, lose weight

## 2022-02-08 ENCOUNTER — Other Ambulatory Visit: Payer: Self-pay | Admitting: Nurse Practitioner

## 2022-02-08 DIAGNOSIS — E039 Hypothyroidism, unspecified: Secondary | ICD-10-CM

## 2022-02-17 ENCOUNTER — Other Ambulatory Visit: Payer: Self-pay

## 2022-02-17 ENCOUNTER — Encounter: Payer: Self-pay | Admitting: Nurse Practitioner

## 2022-02-17 ENCOUNTER — Ambulatory Visit (INDEPENDENT_AMBULATORY_CARE_PROVIDER_SITE_OTHER): Payer: Medicare Other | Admitting: Nurse Practitioner

## 2022-02-17 VITALS — BP 120/73 | HR 76 | Ht 60.0 in | Wt 194.4 lb

## 2022-02-17 DIAGNOSIS — E063 Autoimmune thyroiditis: Secondary | ICD-10-CM | POA: Diagnosis not present

## 2022-02-17 DIAGNOSIS — E038 Other specified hypothyroidism: Secondary | ICD-10-CM | POA: Diagnosis not present

## 2022-02-17 LAB — T4, FREE: Free T4: 1.56 ng/dL (ref 0.82–1.77)

## 2022-02-17 LAB — TSH: TSH: 0.23 u[IU]/mL — ABNORMAL LOW (ref 0.450–4.500)

## 2022-02-17 NOTE — Patient Instructions (Signed)

## 2022-02-17 NOTE — Progress Notes (Signed)
?                                   ?                                Endocrinology Follow Up Note  ?                                       02/17/2022, 9:47 AM ? ?Subjective:  ? ?Subjective   ? ?Barbara Bennett is a 66 y.o.-year-old female patient being seen in follow up after being seen in consultation for hypothyroidism referred by Donell BeersPaseda, Folashade R, FNP.  She was a patient of Dr. Karilyn CotaGosrani. ? ? ?Past Medical History:  ?Diagnosis Date  ? Anemia   ? Anemia, unspecified   ? Arthritis   ? knees  ? Bell's palsy   ? Hyperlipidemia, unspecified   ? Hypothyroidism, unspecified   ? Obesity, unspecified   ? Type 2 diabetes mellitus without complications (HCC)   ? no longer on medications, previously on metformin for borderline DM, A1C 5.6  ? ? ?Past Surgical History:  ?Procedure Laterality Date  ? CATARACT EXTRACTION W/PHACO Left 03/31/2016  ? Procedure: CATARACT EXTRACTION PHACO AND INTRAOCULAR LENS PLACEMENT (IOC);  Surgeon: Gemma PayorKerry Hunt, MD;  Location: [facility];  Service: Ophthalmology;  Laterality: Left;  CDE: 6.45  ? CATARACT EXTRACTION W/PHACO Right 04/28/2016  ? Procedure: CATARACT EXTRACTION PHACO AND INTRAOCULAR LENS PLACEMENT (IOC);  Surgeon: Gemma PayorKerry Hunt, MD;  Location: [facility];  Service: Ophthalmology;  Laterality: Right;  CDE 5.00  ? COLONOSCOPY N/A 12/30/2020  ? Procedure: COLONOSCOPY;  Surgeon: Corbin Adeourk, Robert M, MD;  Location: [facility];  Service: Endoscopy;  Laterality: N/A;  9:30am  ? KNEE ARTHROSCOPY Left   ? ? ?Social History  ? ?Socioeconomic History  ? Marital status: Married  ?  Spouse name: Not on file  ? Number of children: 2  ? Years of education: 1212  ? Highest education level: Not on file  ?Occupational History  ? 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 Smoking status: Never  ? Smokeless tobacco: Never  ?Vaping Use  ? Vaping Use: Never used  ?Substance and Sexual Activity  ? Alcohol use: No  ? Drug use: No  ? Sexual activity: Not Currently  ?Other Topics Concern  ? Not on file   ?Social History Narrative  ? 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  ?   ? Son deceased  ? Daughter lives nearby  ? ?Social Determinants of Health  ? ?Financial Resource Strain: Low Risk   ? Difficulty of Paying Living Expenses: Not hard at all  ?Food Insecurity: No Food Insecurity  ? Worried About Programme researcher, broadcasting/film/videounning Out of Food in the Last Year: Never true  ? Ran Out of Food in the Last Year: Never true  ?Transportation Needs: No Transportation Needs  ? Lack of Transportation (Medical): No  ? Lack of Transportation (Non-Medical): No  ?Physical Activity: Sufficiently Active  ? Days of Exercise per Week: 5 days  ? Minutes of Exercise per Session: 30 min  ?Stress: No Stress Concern Present  ? Feeling of Stress : Not at all  ?Social Connections: Moderately Integrated  ? Frequency of Communication with Friends  and Family: More than three times a week  ? Frequency of Social Gatherings with Friends and Family: More than three times a week  ? Attends Religious Services: More than 4 times per year  ? Active Member of Clubs or Organizations: No  ? Attends Banker Meetings: Never  ? Marital Status: Married  ? ? ?Family History  ?Problem Relation Age of Onset  ? Diabetes Mother   ? Heart disease Mother   ? Heart disease Father   ? Diabetes Sister   ? Renal Disease Sister   ?     Dialysis  ? Asthma Sister   ? Hypertension Sister   ? Diabetes Sister   ? Arthritis Sister   ? Diabetes Sister   ? Renal Disease Sister   ?     Dialysis  ? Hypertension Sister   ? Migraines Daughter   ? Pneumonia Son   ? Colon cancer Neg Hx   ? Sickle cell anemia Neg Hx   ? Thalassemia Neg Hx   ? Leukemia Neg Hx   ? ? ?Outpatient Encounter Medications as of 02/17/2022  ?Medication Sig  ? atorvastatin (LIPITOR) 20 MG tablet Take 1 tablet (20 mg total) by mouth daily.  ? Cinnamon 500 MG capsule Take 1,000 mg by mouth daily.  ? hydrochlorothiazide (HYDRODIURIL) 25 MG tablet Take 1 tablet (25 mg total) by mouth  daily.  ? ibuprofen (ADVIL,MOTRIN) 200 MG tablet Take 200-400 mg by mouth every 6 (six) hours as needed for moderate pain.  ? irbesartan (AVAPRO) 75 MG tablet TAKE 1 TABLET(75 MG) BY MOUTH DAILY  ? levothyroxine (SYNTHROID) 88 MCG tablet TAKE 1 TABLET(88 MCG) BY MOUTH DAILY  ? ?No facility-administered encounter medications on file as of 02/17/2022.  ? ? ?ALLERGIES: ?Allergies  ?Allergen Reactions  ? Penicillins Rash  ?  Has patient had a PCN reaction causing immediate rash, facial/tongue/throat swelling, SOB or lightheadedness with hypotension: Yes ?Has patient had a PCN reaction causing severe rash involving mucus membranes or skin necrosis: No ?Has patient had a PCN reaction that required hospitalization No ?Has patient had a PCN reaction occurring within the last 10 years: Yes ?If all of the above answers are "NO", then may proceed with Cephalosporin use. ?  ? Sulfa Antibiotics Rash  ? ?VACCINATION STATUS: ?Immunization History  ?Administered Date(s) Administered  ? Fluad Quad(high Dose 65+) 09/29/2020, 08/24/2021  ? Moderna Sars-Covid-2 Vaccination 01/15/2020, 02/12/2020, 11/09/2020  ? Pneumococcal Conjugate-13 11/04/2020  ? Pneumococcal Polysaccharide-23 08/06/2019  ? ? ? ?HPI  ? ?Barbara Bennett  is a patient with the above medical history. she was diagnosed  with hypothyroidism at approximate age of 67 years, which required subsequent initiation of thyroid hormone replacement. she was given various doses of Levothyroxine currently on 88 mcg. she reports compliance to this medication:  Taking it daily, on an empty stomach at least 30 minutes prior to eating/drinking or taking other medications, waiting 4 hrs before taking Calcium supplement, MVIs, or acid reflux medications. ? ? ?I reviewed patient's thyroid tests: ? ?Lab Results  ?Component Value Date  ? TSH 0.230 (L) 02/16/2022  ? TSH 0.267 (L) 10/14/2021  ? TSH 0.02 (L) 01/05/2021  ? TSH 5.58 (H) 11/04/2020  ? TSH 4.43 08/19/2020  ? TSH 5.74 06/06/2018  ?  FREET4 1.56 02/16/2022  ? FREET4 1.51 10/14/2021  ? FREET4 1.2 08/19/2020  ?  ? ?Pt denies feeling nodules in neck, hoarseness, dysphagia/odynophagia, SOB with lying down. ? ?she denies family history of  thyroid disorders.  No family history of thyroid cancer.  ?No history of radiation therapy to head or neck.  No recent use of iodine supplements.  She does take a Womens MVI with Biotin in it.  ? ?I reviewed her chart and she also has a history of DM 2, HTN, HLD, IBS. ? ? ?Review of systems ? ?Constitutional: + Minimally fluctuating body weight,  current Body mass index is 37.97 kg/m?. , no fatigue, no subjective hyperthermia, no subjective hypothermia ?Eyes: no blurry vision, no xerophthalmia ?ENT: no sore throat, no nodules palpated in throat, no dysphagia/odynophagia, no hoarseness ?Cardiovascular: no chest pain, no shortness of breath, no palpitations, no leg swelling ?Respiratory: no cough, no shortness of breath ?Gastrointestinal: no nausea/vomiting/diarrhea ?Musculoskeletal: no muscle/joint aches ?Skin: no rashes, no hyperemia ?Neurological: no tremors, no numbness, no tingling, no dizziness ?Psychiatric: no depression, no anxiety ? ? ?Objective:  ? ?Objective   ? ? ?BP 120/73   Pulse 76   Ht 5' (1.524 m)   Wt 194 lb 6.4 oz (88.2 kg)   LMP 04/01/2012   SpO2 99%   BMI 37.97 kg/m?  ?Wt Readings from Last 3 Encounters:  ?02/17/22 194 lb 6.4 oz (88.2 kg)  ?12/01/21 193 lb 1.3 oz (87.6 kg)  ?10/19/21 195 lb 6.4 oz (88.6 kg)  ? ? ?BP Readings from Last 3 Encounters:  ?02/17/22 120/73  ?12/01/21 (!) 163/88  ?10/19/21 132/86  ?  ? ?Physical Exam- Limited ? ?Constitutional:  Body mass index is 37.97 kg/m?. , not in acute distress, normal state of mind ?Eyes:  EOMI, no exophthalmos ?Neck: Supple ?Cardiovascular: RRR, no murmurs, rubs, or gallops, no edema ?Respiratory: Adequate breathing efforts, no crackles, rales, rhonchi, or wheezing ?Musculoskeletal: no gross deformities, strength intact in all four  extremities, no gross restriction of joint movements ?Skin:  no rashes, no hyperemia ?Neurological: no tremor with outstretched hands ? ? ?CMP ( most recent) ?CMP  ?   ?Component Value Date/Time  ? NA 142 11/30/2021 1114

## 2022-03-05 LAB — CMP14+EGFR
ALT: 12 IU/L (ref 0–32)
AST: 16 IU/L (ref 0–40)
Albumin/Globulin Ratio: 1.7 (ref 1.2–2.2)
Albumin: 4.4 g/dL (ref 3.8–4.8)
Alkaline Phosphatase: 60 IU/L (ref 44–121)
BUN/Creatinine Ratio: 19 (ref 12–28)
BUN: 29 mg/dL — ABNORMAL HIGH (ref 8–27)
Bilirubin Total: 0.3 mg/dL (ref 0.0–1.2)
CO2: 20 mmol/L (ref 20–29)
Calcium: 9.2 mg/dL (ref 8.7–10.3)
Chloride: 106 mmol/L (ref 96–106)
Creatinine, Ser: 1.49 mg/dL — ABNORMAL HIGH (ref 0.57–1.00)
Globulin, Total: 2.6 g/dL (ref 1.5–4.5)
Glucose: 139 mg/dL — ABNORMAL HIGH (ref 70–99)
Potassium: 4.9 mmol/L (ref 3.5–5.2)
Sodium: 140 mmol/L (ref 134–144)
Total Protein: 7 g/dL (ref 6.0–8.5)
eGFR: 38 mL/min/{1.73_m2} — ABNORMAL LOW (ref >59)

## 2022-03-05 LAB — CBC WITH DIFFERENTIAL/PLATELET
Basophils Absolute: 0.1 10*3/uL (ref 0.0–0.2)
Basos: 1 %
EOS (ABSOLUTE): 0.3 10*3/uL (ref 0.0–0.4)
Eos: 4 %
Hematocrit: 31.9 % — ABNORMAL LOW (ref 34.0–46.6)
Hemoglobin: 10.6 g/dL — ABNORMAL LOW (ref 11.1–15.9)
Immature Grans (Abs): 0 10*3/uL (ref 0.0–0.1)
Immature Granulocytes: 0 %
Lymphocytes Absolute: 2 10*3/uL (ref 0.7–3.1)
Lymphs: 36 %
MCH: 27.2 pg (ref 26.6–33.0)
MCHC: 33.2 g/dL (ref 31.5–35.7)
MCV: 82 fL (ref 79–97)
Monocytes Absolute: 0.5 10*3/uL (ref 0.1–0.9)
Monocytes: 8 %
Neutrophils Absolute: 2.8 10*3/uL (ref 1.4–7.0)
Neutrophils: 51 %
Platelets: 180 10*3/uL (ref 150–450)
RBC: 3.89 x10E6/uL (ref 3.77–5.28)
RDW: 12.2 % (ref 11.7–15.4)
WBC: 5.6 10*3/uL (ref 3.4–10.8)

## 2022-03-05 LAB — IRON,TIBC AND FERRITIN PANEL
Ferritin: 632 ng/mL — ABNORMAL HIGH (ref 15–150)
Iron Saturation: 29 % (ref 15–55)
Iron: 77 ug/dL (ref 27–139)
Total Iron Binding Capacity: 262 ug/dL (ref 250–450)
UIBC: 185 ug/dL (ref 118–369)

## 2022-03-05 LAB — HEMOGLOBIN A1C
Est. average glucose Bld gHb Est-mCnc: 143 mg/dL
Hgb A1c MFr Bld: 6.6 % — ABNORMAL HIGH (ref 4.8–5.6)

## 2022-03-05 LAB — MICROALBUMIN / CREATININE URINE RATIO
Creatinine, Urine: 104.7 mg/dL
Microalb/Creat Ratio: <3 mg/g creat (ref 0–29)
Microalbumin, Urine: <3 ug/mL

## 2022-03-05 LAB — LIPID PANEL WITH LDL/HDL RATIO
Cholesterol, Total: 187 mg/dL (ref 100–199)
HDL: 35 mg/dL — ABNORMAL LOW (ref >39)
LDL Chol Calc (NIH): 128 mg/dL — ABNORMAL HIGH (ref 0–99)
LDL/HDL Ratio: 3.7 ratio — ABNORMAL HIGH (ref 0.0–3.2)
Triglycerides: 130 mg/dL (ref 0–149)
VLDL Cholesterol Cal: 24 mg/dL (ref 5–40)

## 2022-03-09 ENCOUNTER — Ambulatory Visit (INDEPENDENT_AMBULATORY_CARE_PROVIDER_SITE_OTHER): Payer: Medicare Other | Admitting: Nurse Practitioner

## 2022-03-09 ENCOUNTER — Encounter: Payer: Self-pay | Admitting: Nurse Practitioner

## 2022-03-09 ENCOUNTER — Other Ambulatory Visit: Payer: Self-pay | Admitting: Nurse Practitioner

## 2022-03-09 VITALS — BP 122/80 | HR 74 | Ht 60.0 in | Wt 192.0 lb

## 2022-03-09 DIAGNOSIS — E118 Type 2 diabetes mellitus with unspecified complications: Secondary | ICD-10-CM | POA: Diagnosis not present

## 2022-03-09 DIAGNOSIS — E063 Autoimmune thyroiditis: Secondary | ICD-10-CM

## 2022-03-09 DIAGNOSIS — I10 Essential (primary) hypertension: Secondary | ICD-10-CM | POA: Diagnosis not present

## 2022-03-09 DIAGNOSIS — E038 Other specified hypothyroidism: Secondary | ICD-10-CM | POA: Diagnosis not present

## 2022-03-09 DIAGNOSIS — N179 Acute kidney failure, unspecified: Secondary | ICD-10-CM | POA: Insufficient documentation

## 2022-03-09 DIAGNOSIS — M7989 Other specified soft tissue disorders: Secondary | ICD-10-CM

## 2022-03-09 DIAGNOSIS — E785 Hyperlipidemia, unspecified: Secondary | ICD-10-CM

## 2022-03-09 MED ORDER — IRBESARTAN 150 MG PO TABS: 150.0000 mg | ORAL_TABLET | Freq: Every day | ORAL | 1 refills | Status: DC

## 2022-03-09 MED ORDER — ATORVASTATIN CALCIUM 40 MG PO TABS: 40.0000 mg | ORAL_TABLET | Freq: Every day | ORAL | 3 refills | Status: DC

## 2022-03-09 NOTE — Progress Notes (Signed)
? ?Barbara Bennett     MRN: [id number]      DOB: 1956/02/04 ? ? ?HPI ?Barbara Bennett with past medical history of essential hypertension, hypothyroidism, type 2 diabetes, hyperlipidemia is here for follow up and re-evaluation of chronic medical conditions, medication management and review of any available recent lab  ?[name]  ?Preventive health is updated, specifically  Cancer screening and Immunization.  Patient is due for mammogram and DEXA scan, patient refused referral for both test today need for the test discussed with patient she verbalized understanding ? ?The PT denies any adverse reactions to current medications since the last visit.  ? ? ?Pt c/o chronic bilateral ankle edema for a couple of month, walking makes her swelling worse, compression socks helps her edema. She does have pain in her right foot after walking , takes Advil as needed, Advil helps her foot pain .  ?  ? ?ROS ?Denies recent fever or chills. ?Denies sinus pressure, nasal congestion, ear pain or sore throat. ?Denies chest congestion, productive cough or wheezing. ?Denies chest pains, palpitations and leg swelling ?Denies abdominal pain, nausea, vomiting,diarrhea or constipation.   ?Denies dysuria, frequency, hesitancy or incontinence. ?Denies joint pain, swelling and limitation in mobility. ?Denies headaches, seizures, numbness, or tingling. ?Denies depression, anxiety or insomnia. ?Denies skin break down or rash. ? ? ?PE ? ?BP 122/80 (BP Location: Left Arm, Patient Position: Sitting, Cuff Size: Large)   Pulse 74   Ht 5' (1.524 m)   Wt 192 lb (87.1 kg)   LMP 04/01/2012   SpO2 98%   BMI 37.50 kg/m?  ? ?Patient alert and oriented and in no cardiopulmonary distress. ? ?Chest: Clear to auscultation bilaterally. ? ?CVS: S1, S2 no murmurs, no S3.Regular rate. ? ?ABD: Soft non tender.  ? ?Ext: bilateral pitting edema to bilateral ankle , has palpable pedal  pulses  ? ?MS: Adequate ROM spine, shoulders, hips and knees. ? ?Skin: Intact, no  ulcerations or rash noted. ? ?Psych: Good eye contact, normal affect. Memory intact not anxious or depressed appearing. ? ? ? ?Assessment & Plan ?Essential (primary) hypertension ?BP Readings from Last 3 Encounters:  ?03/09/22 122/80  ?02/17/22 120/73  ?12/01/21 (!) 163/88  ?on hydrochlorothiazide 48m daily, irbesartan 738mdaily.  ?Stop hctz due to AKI ?Increase iberstan to 15024m BMP in 2 weeks , monitor BP at home and follw up in 6 weeks .  ?DASH diet advised, engage in regular exercises. ?Lab Results  ?Component Value Date  ? NA 140 03/03/2022  ? K 4.9 03/03/2022  ? CO2 20 03/03/2022  ? GLUCOSE 139 (H) 03/03/2022  ? BUN 29 (H) 03/03/2022  ? CREATININE 1.49 (H) 03/03/2022  ? CALCIUM 9.2 03/03/2022  ? EGFR 38 (L) 03/03/2022  ? GFRNONAA 51 (L) 01/05/2021  ?.   ? ?Hyperlipidemia, unspecified ?Lab Results  ?Component Value Date  ? CHOL 187 03/03/2022  ? HDL 35 (L) 03/03/2022  ? LDLCALC 128 (H) 03/03/2022  ? TRIG 130 03/03/2022  ? CHOLHDL 3.7 08/19/2020  ?takes atorvastatin 50m64mily  ?Stat atorvastatin 40mg40mly  ?Recheck labs in 6 weeks.  ?Avoid fried fatty foods.  ?LDL goal is less than 70.  ? ?Controlled diabetes mellitus type 2 with complications (HCC) Carrizob Results  ?Component Value Date  ? HGBA1C 6.6 (H) 03/03/2022  ?Not currently taking medication, states that she was on metformin in the past. ?Patient would like to make some lifestyle changes for now instead of going back on medication. ?Avoid sugar sweets soda, engage  in regular exercise 150 minutes a week. ?Due for diabetic eye exam, states that she will schedule an appointment with her ophthalmologist. ?Will do foot exam at her next visit. ?On ibersartan, Lipitor 40 mg ? ? ?Leg swelling ?Patient encouraged to wear compression socks and keep legs elevated to help with swelling. ?Hydrochlorothiazide discontinued today due to acute kidney injury.  ?Swelling likely some venous insufficiency ? ?AKI (acute kidney injury) (Home) ?Lab Results  ?Component Value  Date  ? NA 140 03/03/2022  ? K 4.9 03/03/2022  ? CO2 20 03/03/2022  ? GLUCOSE 139 (H) 03/03/2022  ? BUN 29 (H) 03/03/2022  ? CREATININE 1.49 (H) 03/03/2022  ? CALCIUM 9.2 03/03/2022  ? EGFR 38 (L) 03/03/2022  ? GFRNONAA 51 (L) 01/05/2021  ?Patient was started on hydrochlorothiazide at her last visit. ?Stop hydrochlorothiazide today, avoid Aleve ibuprofen and other nephrotoxic agents. ?Irbesartan increased to 150 mg p.o. daily ?Drink at least 64 ounces of water daily ?Check BMP in 2 weeks  ? ?

## 2022-03-09 NOTE — Patient Instructions (Addendum)
Please stop taking hydrochlorothiazide. ?Start taking irbesartan 150mg  daily , monitor blood pressure at home , keep a log and bring to next visit.  ? ?Start taking atorvastatin 40mg  daily for your cholesterol. ? ? ?It is important that you exercise regularly at least 30 minutes 5 times a week.  ?Think about what you will eat, plan ahead. ?Choose " clean, green, fresh or frozen" over canned, processed or packaged foods which are more sugary, salty and fatty. ?70 to 75% of food eaten should be vegetables and fruit. ?Three meals at set times with snacks allowed between meals, but they must be fruit or vegetables. ?Aim to eat over a 12 hour period , example 7 am to 7 pm, and STOP after  your last meal of the day. ?Drink water,generally about 64 ounces per day, no other drink is as healthy. Fruit juice is best enjoyed in a healthy way, by EATING the fruit. ? ?Thanks for choosing [facility], we consider it a privelige to serve you. ? ?

## 2022-03-09 NOTE — Assessment & Plan Note (Signed)
Patient encouraged to wear compression socks and keep legs elevated to help with swelling. ?Hydrochlorothiazide discontinued today due to acute kidney injury.  ?Swelling likely some venous insufficiency ?

## 2022-03-09 NOTE — Assessment & Plan Note (Signed)
Lab Results  ?Component Value Date  ? NA 140 03/03/2022  ? K 4.9 03/03/2022  ? CO2 20 03/03/2022  ? GLUCOSE 139 (H) 03/03/2022  ? BUN 29 (H) 03/03/2022  ? CREATININE 1.49 (H) 03/03/2022  ? CALCIUM 9.2 03/03/2022  ? EGFR 38 (L) 03/03/2022  ? GFRNONAA 51 (L) 01/05/2021  ?Patient was started on hydrochlorothiazide at her last visit. ?Stop hydrochlorothiazide today, avoid Aleve ibuprofen and other nephrotoxic agents. ?Irbesartan increased to 150 mg p.o. daily ?Drink at least 64 ounces of water daily ?Check BMP in 2 weeks ?

## 2022-03-09 NOTE — Assessment & Plan Note (Signed)
Lab Results  ?Component Value Date  ? CHOL 187 03/03/2022  ? HDL 35 (L) 03/03/2022  ? LDLCALC 128 (H) 03/03/2022  ? TRIG 130 03/03/2022  ? CHOLHDL 3.7 08/19/2020  ?takes atorvastatin 20mg  daily  ?Stat atorvastatin 40mg  daily  ?Recheck labs in 6 weeks.  ?Avoid fried fatty foods.  ?LDL goal is less than 70.  ?

## 2022-03-09 NOTE — Assessment & Plan Note (Addendum)
BP Readings from Last 3 Encounters:  ?03/09/22 122/80  ?02/17/22 120/73  ?12/01/21 (!) 163/88  ?on hydrochlorothiazide 25mg daily, irbesartan 75mg daily.  ?Stop hctz due to AKI ?Increase iberstan to 150mg , BMP in 2 weeks , monitor BP at home and follw up in 6 weeks .  ?DASH diet advised, engage in regular exercises. ?Lab Results  ?Component Value Date  ? NA 140 03/03/2022  ? K 4.9 03/03/2022  ? CO2 20 03/03/2022  ? GLUCOSE 139 (H) 03/03/2022  ? BUN 29 (H) 03/03/2022  ? CREATININE 1.49 (H) 03/03/2022  ? CALCIUM 9.2 03/03/2022  ? EGFR 38 (L) 03/03/2022  ? GFRNONAA 51 (L) 01/05/2021  ?.   ?

## 2022-03-09 NOTE — Assessment & Plan Note (Addendum)
Lab Results  ?Component Value Date  ? HGBA1C 6.6 (H) 03/03/2022  ?Not currently taking medication, states that she was on metformin in the past. ?Patient would like to make some lifestyle changes for now instead of going back on medication. ?Avoid sugar sweets soda, engage in regular exercise 150 minutes a week. ?Due for diabetic eye exam, states that she will schedule an appointment with her ophthalmologist. ?Will do foot exam at her next visit. ?On ibersartan, Lipitor 40 mg ? ?

## 2022-03-17 ENCOUNTER — Encounter: Payer: Self-pay | Admitting: Nurse Practitioner

## 2022-03-18 NOTE — Telephone Encounter (Signed)
Please advise 

## 2022-03-21 NOTE — Telephone Encounter (Signed)
Called pt no answer left vm will reply via mychart ?

## 2022-03-25 LAB — BASIC METABOLIC PANEL
BUN/Creatinine Ratio: 12 (ref 12–28)
BUN: 14 mg/dL (ref 8–27)
CO2: 20 mmol/L (ref 20–29)
Calcium: 9.5 mg/dL (ref 8.7–10.3)
Chloride: 104 mmol/L (ref 96–106)
Creatinine, Ser: 1.19 mg/dL — ABNORMAL HIGH (ref 0.57–1.00)
Glucose: 101 mg/dL — ABNORMAL HIGH (ref 70–99)
Potassium: 4.7 mmol/L (ref 3.5–5.2)
Sodium: 139 mmol/L (ref 134–144)
eGFR: 50 mL/min/{1.73_m2} — ABNORMAL LOW (ref >59)

## 2022-03-25 NOTE — Progress Notes (Signed)
Kidney function much improved, continue current medications

## 2022-04-22 ENCOUNTER — Ambulatory Visit (INDEPENDENT_AMBULATORY_CARE_PROVIDER_SITE_OTHER): Payer: Medicare Other | Admitting: Nurse Practitioner

## 2022-04-22 ENCOUNTER — Encounter: Payer: Self-pay | Admitting: Nurse Practitioner

## 2022-04-22 VITALS — BP 131/74 | HR 67 | Ht 60.0 in | Wt 193.0 lb

## 2022-04-22 DIAGNOSIS — M25561 Pain in right knee: Secondary | ICD-10-CM | POA: Diagnosis not present

## 2022-04-22 DIAGNOSIS — I10 Essential (primary) hypertension: Secondary | ICD-10-CM | POA: Diagnosis not present

## 2022-04-22 DIAGNOSIS — N179 Acute kidney failure, unspecified: Secondary | ICD-10-CM | POA: Diagnosis not present

## 2022-04-22 DIAGNOSIS — Z780 Asymptomatic menopausal state: Secondary | ICD-10-CM | POA: Diagnosis not present

## 2022-04-22 DIAGNOSIS — E669 Obesity, unspecified: Secondary | ICD-10-CM

## 2022-04-22 DIAGNOSIS — E118 Type 2 diabetes mellitus with unspecified complications: Secondary | ICD-10-CM

## 2022-04-22 DIAGNOSIS — R051 Acute cough: Secondary | ICD-10-CM | POA: Insufficient documentation

## 2022-04-22 MED ORDER — EMPAGLIFLOZIN 10 MG PO TABS: 10.0000 mg | ORAL_TABLET | Freq: Every day | ORAL | 1 refills | Status: DC

## 2022-04-22 MED ORDER — DICLOFENAC SODIUM 1 % EX GEL: 4.0000 g | Freq: Four times a day (QID) | CUTANEOUS | 1 refills | Status: AC

## 2022-04-22 NOTE — Assessment & Plan Note (Signed)
Lab Results  Component Value Date   NA 139 03/24/2022   K 4.7 03/24/2022   CO2 20 03/24/2022   BUN 14 03/24/2022   CREATININE 1.19 (H) 03/24/2022   CALCIUM 9.5 03/24/2022   GLUCOSE 101 (H) 03/24/2022  Kidney function back to baseline Avoid NSAID, and other nephrotoxic agent Started on Jardiance 10 mg daily for diabetes Drink at least 64 ounces of water daily

## 2022-04-22 NOTE — Progress Notes (Signed)
Barbara Bennett     MRN: [id number]      DOB: 03/19/1956   HPI Barbara Bennett with past medical history of hypertension, hypothyroidism, controlled diabetes type 2 with complication is here for follow up for hypertension.  Patient was taking of hydrochlorothiazide due to AKI at her previous visit, currently taking irbesartan 150 mg tablet daily, blood pressure readings at home has been under control, home BP readings reviewed by me today125/79, 107/72.  Patient denies chest pain, dizziness, edema.   Pt c/o dry hacking cough since 3 days ago, denies chest pain, wheezing, shortness of breath, fever, chills, malaise, sneezing, stuffiness ,she has been taking delsym as needed, states that does not is helping her symptoms.  Denies known exposure to COVID or flu.   Pt c/o right knee pain that started 4 days ago , she sat in a [place] drive [place] the beach last weekend. She denies previous knee pain. Has aching pain 5/10, denies numbness, tingling, swelling, trauma. She has tried taking tylenol , tylenol has not been effective.   Due for mammogram and DEXA scan , refused referal for mamaogram today .     ROS Denies recent fever or chills. Denies sinus pressure, nasal congestion, ear pain or sore throat. Denies chest congestion, productive cough or wheezing. Denies chest pains, palpitations and leg swelling Denies abdominal pain, nausea, vomiting,diarrhea or constipation.   Denies dysuria, frequency, hesitancy or incontinence. Denies depression, anxiety or insomnia.    PE  BP 131/74 (BP Location: Left Arm, Patient Position: Sitting, Cuff Size: Large)   Pulse 67   Ht 5' (1.524 m)   Wt 193 lb (87.5 kg)   LMP 04/01/2012   SpO2 95%   BMI 37.69 kg/m   Patient alert and oriented and in no cardiopulmonary distress.  Chest: Clear to auscultation bilaterally.  CVS: S1, S2 no murmurs, no S3.Regular rate.  ABD: Soft non tender.   Ext: No edema  MS: Adequate ROM spine, shoulders, hips and  knees.  Psych: Good eye contact, normal affect. Memory intact not anxious or depressed appearing.  CNS: CN 2-12 intact, power,  normal throughout.no focal deficits noted.   Assessment & Plan  Obesity, unspecified This SmartLink has not been configured with any valid records.    Wt Readings from Last 3 Encounters:  04/22/22 193 lb (87.5 kg)  03/09/22 192 lb (87.1 kg)  02/17/22 194 lb 6.4 oz (88.2 kg)  Does not do exercises currently Need to increase intake of whole food consisting mainly vegetables and protein less carbohydrate drinking at least 64 ounces of water daily, engaging in moderate exercise with at least 150 minutes weekly, importance of portion control also discussed. Started on Jardiance 10 mg daily for diabetes this will also assist with weight loss  Essential (primary) hypertension BP Readings from Last 3 Encounters:  04/22/22 131/74  03/09/22 122/80  02/17/22 120/73  Currently on Ibersartan  150 mg daily Continue current medication DASH diet advised, engage in moderate exercises at least 150 minutes weekly Lab Results  Component Value Date   NA 139 03/24/2022   K 4.7 03/24/2022   CO2 20 03/24/2022   BUN 14 03/24/2022   CREATININE 1.19 (H) 03/24/2022   CALCIUM 9.5 03/24/2022   GLUCOSE 101 (H) 03/24/2022    Controlled diabetes mellitus type 2 with complications (HCC) Lab Results  Component Value Date   HGBA1C 6.6 (H) 03/03/2022   She was previously on metformin stated that the medication was discontinued because her blood sugar  was well controlled at one-point Would like to start back on medication for diabetes Rx Jardiance 10 mg daily, this will assist with weight loss and kidney protection Side effect of medication discussed with patient Follow-up in 6 weeks Avoid sugar sweets soda  AKI (acute kidney injury) (HCC) Lab Results  Component Value Date   NA 139 03/24/2022   K 4.7 03/24/2022   CO2 20 03/24/2022   BUN 14 03/24/2022   CREATININE 1.19  (H) 03/24/2022   CALCIUM 9.5 03/24/2022   GLUCOSE 101 (H) 03/24/2022  Kidney function back to baseline Avoid NSAID, and other nephrotoxic agent Started on Jardiance 10 mg daily for diabetes Drink at least 64 ounces of water daily  Acute pain of right knee Knee warm to touch, no swelling or skin discoloration noted Use Voltaren gel for grams up to 4 times daily as needed for knee pain Continue Tylenol as needed Use of heat discussed with patient Engage in regular moderate exercise daily  Acute cough Denies fever chills malaise Has been taking OTC Delsym, medication helping Continue OTC Delsym

## 2022-04-22 NOTE — Assessment & Plan Note (Signed)
Denies fever chills malaise Has been taking OTC Delsym, medication helping Continue OTC Delsym

## 2022-04-22 NOTE — Assessment & Plan Note (Signed)
BP Readings from Last 3 Encounters:  04/22/22 131/74  03/09/22 122/80  02/17/22 120/73  Currently on Ibersartan  150 mg daily Continue current medication DASH diet advised, engage in moderate exercises at least 150 minutes weekly Lab Results  Component Value Date   NA 139 03/24/2022   K 4.7 03/24/2022   CO2 20 03/24/2022   BUN 14 03/24/2022   CREATININE 1.19 (H) 03/24/2022   CALCIUM 9.5 03/24/2022   GLUCOSE 101 (H) 03/24/2022

## 2022-04-22 NOTE — Assessment & Plan Note (Signed)
Lab Results  Component Value Date   HGBA1C 6.6 (H) 03/03/2022   She was previously on metformin stated that the medication was discontinued because her blood sugar was well controlled at one-point Would like to start back on medication for diabetes Rx Jardiance 10 mg daily, this will assist with weight loss and kidney protection Side effect of medication discussed with patient Follow-up in 6 weeks Avoid sugar sweets soda

## 2022-04-22 NOTE — Assessment & Plan Note (Addendum)
This SmartLink has not been configured with any valid records.    Wt Readings from Last 3 Encounters:  04/22/22 193 lb (87.5 kg)  03/09/22 192 lb (87.1 kg)  02/17/22 194 lb 6.4 oz (88.2 kg)  Does not do exercises currently Need to increase intake of whole food consisting mainly vegetables and protein less carbohydrate drinking at least 64 ounces of water daily, engaging in moderate exercise with at least 150 minutes weekly, importance of portion control also discussed. Started on Jardiance 10 mg daily for diabetes this will also assist with weight loss

## 2022-04-22 NOTE — Assessment & Plan Note (Signed)
Knee warm to touch, no swelling or skin discoloration noted Use Voltaren gel for grams up to 4 times daily as needed for knee pain Continue Tylenol as needed Use of heat discussed with patient Engage in regular moderate exercise daily

## 2022-04-22 NOTE — Patient Instructions (Addendum)
Diclofenac gel. Apply 4 g to right knee up to 4 times daily  Please start taking jardaince 10mg  daily for your diabetes.   It is important that you exercise regularly at least 30 minutes 5 times a week.  Think about what you will eat, plan ahead. Choose " clean, green, fresh or frozen" over canned, processed or packaged foods which are more sugary, salty and fatty. 70 to 75% of food eaten should be vegetables and fruit. Three meals at set times with snacks allowed between meals, but they must be fruit or vegetables. Aim to eat over a 12 hour period , example 7 am to 7 pm, and STOP after  your last meal of the day. Drink water,generally about 64 ounces per day, no other drink is as healthy. Fruit juice is best enjoyed in a healthy way, by EATING the fruit.  Thanks for choosing [facility], we consider it a privelige to serve you.

## 2022-05-09 ENCOUNTER — Other Ambulatory Visit: Payer: Self-pay | Admitting: Nurse Practitioner

## 2022-05-09 DIAGNOSIS — I10 Essential (primary) hypertension: Secondary | ICD-10-CM

## 2022-05-12 ENCOUNTER — Other Ambulatory Visit: Payer: Self-pay | Admitting: Nurse Practitioner

## 2022-05-12 DIAGNOSIS — E039 Hypothyroidism, unspecified: Secondary | ICD-10-CM

## 2022-05-20 LAB — TSH: TSH: 0.279 u[IU]/mL — ABNORMAL LOW (ref 0.450–4.500)

## 2022-05-20 LAB — T4, FREE: Free T4: 1.54 ng/dL (ref 0.82–1.77)

## 2022-05-23 ENCOUNTER — Ambulatory Visit: Payer: Medicare Other | Admitting: Nurse Practitioner

## 2022-06-03 ENCOUNTER — Encounter: Payer: Self-pay | Admitting: Nurse Practitioner

## 2022-06-03 ENCOUNTER — Other Ambulatory Visit: Payer: Self-pay | Admitting: Nurse Practitioner

## 2022-06-03 ENCOUNTER — Ambulatory Visit (INDEPENDENT_AMBULATORY_CARE_PROVIDER_SITE_OTHER): Payer: Medicare Other | Admitting: Nurse Practitioner

## 2022-06-03 VITALS — BP 114/77 | HR 65 | Resp 16 | Ht 60.0 in | Wt 190.4 lb

## 2022-06-03 DIAGNOSIS — I10 Essential (primary) hypertension: Secondary | ICD-10-CM

## 2022-06-03 DIAGNOSIS — N1831 Chronic kidney disease, stage 3a: Secondary | ICD-10-CM | POA: Diagnosis not present

## 2022-06-03 DIAGNOSIS — Z780 Asymptomatic menopausal state: Secondary | ICD-10-CM

## 2022-06-03 DIAGNOSIS — E118 Type 2 diabetes mellitus with unspecified complications: Secondary | ICD-10-CM

## 2022-06-03 DIAGNOSIS — N183 Chronic kidney disease, stage 3 unspecified: Secondary | ICD-10-CM | POA: Insufficient documentation

## 2022-06-03 NOTE — Progress Notes (Signed)
   Barbara Bennett     MRN: [id number]      DOB: 1956-06-16   HPI Barbara Bennett with past medical history of hypothyroidism, type 2 diabetes, hypertension, hyperlipidemia is here for follow up for type 2 diabetes.  She was started on Jardiance 10 mg daily at her last visit for diabetes.  Patient denies any adverse reaction to [facility] or other current medications.  She is due for screening mammogram but refused referral today, DEXA scan reordered.   Patient encouraged to get her shingles vaccine at the pharmacy  ROS Denies recent fever or chills. Denies sinus pressure, nasal congestion, ear pain or sore throat. Denies chest congestion, productive cough or wheezing. Denies chest pains, palpitations and leg swelling Denies abdominal pain, nausea, vomiting,diarrhea or constipation.   Denies dysuria, frequency, hesitancy or incontinence. Denies joint pain, swelling and limitation in mobility. Denies headaches, seizures, numbness, or tingling. Denies depression, anxiety or insomnia.   PE  BP 114/77   Pulse 65   Resp 16   Ht 5' (1.524 m)   Wt 190 lb 6.4 oz (86.4 kg)   LMP 04/01/2012   SpO2 97%   BMI 37.18 kg/m   Patient alert and oriented and in no cardiopulmonary distress.  Chest: Clear to auscultation bilaterally.  CVS: S1, S2 no murmurs, no S3.Regular rate.  ABD: Soft non tender.   Ext: No edema  MS: Adequate ROM spine, shoulders, hips and knees.  Skin: Intact, no ulcerations or rash noted.  Psych: Good eye contact, normal affect. Memory intact not anxious or depressed appearing.    Assessment & Plan  Controlled diabetes mellitus type 2 with complications ([place]) She is tolerating Jardiance well Continue Jardiance 10 mg daily Avoid sugar sweets soda Follow-up in 3 months  Essential (primary) hypertension BP Readings from Last 3 Encounters:  06/03/22 114/77  04/22/22 131/74  03/09/22 122/80  Chronic condition well-controlled on irbesartan 150 mg  daily Continue current medication DASH diet advised engage in regular moderate exercises at least for 50 minutes weekly Follow-up in 3 months  CKD (chronic kidney disease) stage 3, GFR 30-59 ml/min (HCC) Lab Results  Component Value Date   NA 139 03/24/2022   K 4.7 03/24/2022   CO2 20 03/24/2022   GLUCOSE 101 (H) 03/24/2022   BUN 14 03/24/2022   CREATININE 1.19 (H) 03/24/2022   CALCIUM 9.5 03/24/2022   EGFR 50 (L) 03/24/2022   GFRNONAA 51 (L) 01/05/2021  Stable chronic condition Doing well on Jardiance 10 mg daily Avoid NSAIDs and other nephrotoxic agent Maintain proper hydration

## 2022-06-03 NOTE — Assessment & Plan Note (Signed)
Lab Results  Component Value Date   NA 139 03/24/2022   K 4.7 03/24/2022   CO2 20 03/24/2022   GLUCOSE 101 (H) 03/24/2022   BUN 14 03/24/2022   CREATININE 1.19 (H) 03/24/2022   CALCIUM 9.5 03/24/2022   EGFR 50 (L) 03/24/2022   GFRNONAA 51 (L) 01/05/2021  Stable chronic condition Doing well on Jardiance 10 mg daily Avoid NSAIDs and other nephrotoxic agent Maintain proper hydration

## 2022-06-03 NOTE — Assessment & Plan Note (Signed)
She is tolerating Jardiance well Continue Jardiance 10 mg daily Avoid sugar sweets soda Follow-up in 3 months

## 2022-06-03 NOTE — Assessment & Plan Note (Signed)
BP Readings from Last 3 Encounters:  06/03/22 114/77  04/22/22 131/74  03/09/22 122/80  Chronic condition well-controlled on irbesartan 150 mg daily Continue current medication DASH diet advised engage in regular moderate exercises at least for 50 minutes weekly Follow-up in 3 months

## 2022-06-03 NOTE — Patient Instructions (Signed)

## 2022-06-04 LAB — BASIC METABOLIC PANEL
BUN/Creatinine Ratio: 12 (ref 12–28)
BUN: 15 mg/dL (ref 8–27)
CO2: 22 mmol/L (ref 20–29)
Calcium: 9.5 mg/dL (ref 8.7–10.3)
Chloride: 106 mmol/L (ref 96–106)
Creatinine, Ser: 1.22 mg/dL — ABNORMAL HIGH (ref 0.57–1.00)
Glucose: 111 mg/dL — ABNORMAL HIGH (ref 70–99)
Potassium: 4.6 mmol/L (ref 3.5–5.2)
Sodium: 140 mmol/L (ref 134–144)
eGFR: 48 mL/min/{1.73_m2} — ABNORMAL LOW (ref >59)

## 2022-06-06 NOTE — Progress Notes (Signed)
Kidney function slightly decreased which is expected to pick up when labs are rechecked in about 3-4 months . Pt should avoid NSAIDS, Drink at least 64 ounces of water daily

## 2022-06-09 ENCOUNTER — Ambulatory Visit (INDEPENDENT_AMBULATORY_CARE_PROVIDER_SITE_OTHER): Payer: Medicare Other | Admitting: Nurse Practitioner

## 2022-06-09 ENCOUNTER — Encounter: Payer: Self-pay | Admitting: Nurse Practitioner

## 2022-06-09 VITALS — BP 130/84 | HR 85 | Ht 60.0 in | Wt 192.0 lb

## 2022-06-09 DIAGNOSIS — M79671 Pain in right foot: Secondary | ICD-10-CM | POA: Diagnosis not present

## 2022-06-09 DIAGNOSIS — M25561 Pain in right knee: Secondary | ICD-10-CM

## 2022-06-09 DIAGNOSIS — M79661 Pain in right lower leg: Secondary | ICD-10-CM

## 2022-06-09 MED ORDER — METHYLPREDNISOLONE ACETATE 80 MG/ML IJ SUSP
80.0000 mg | Freq: Once | INTRAMUSCULAR | Status: AC
  Administered 2022-06-09: 80 mg via INTRAMUSCULAR

## 2022-06-09 MED ORDER — CYCLOBENZAPRINE HCL 5 MG PO TABS: 5.0000 mg | ORAL_TABLET | Freq: Three times a day (TID) | ORAL | 0 refills | Status: DC | PRN

## 2022-06-09 MED ORDER — METHYLPREDNISOLONE 4 MG PO TBPK: ORAL_TABLET | ORAL | 0 refills | Status: DC

## 2022-06-09 NOTE — Assessment & Plan Note (Signed)
Pain localized to the popliteal area.  Stat venous US ordered to rule out DVT  depomedrol injection 80mg  IM given  RX flexeril 5mg  TID PRN MEDROL dose pak 4mg  ordered.

## 2022-06-09 NOTE — Progress Notes (Signed)
   Barbara Bennett     MRN: [id number]      DOB: 1955-12-13   HPI Barbara Bennett with past medical history of diabetes, hypothyroidism, hypertension is here for complaints of right leg swelling and pain since the past 3 days. Right leg sweeling and pain since 3 days ago. Pain is localised to her posterior knee area, pain is worse with weight bearing , has aching pain 8/10, her daughter had blood clot in the back of her knee some years ago. denies numbness, tingling, trauma, recent prolonged sitting, SOB , CP  She has been applying diclofenac gel but ts not helping , tylenol arthritis not helping either.       ROS Denies recent fever or chills. Denies sinus pressure, nasal congestion, ear pain or sore throat. Denies chest congestion, productive cough or wheezing. Denies chest pains, palpitations and leg swelling Denies abdominal pain, nausea, vomiting,diarrhea or constipation.   Denies dysuria, frequency, hesitancy or incontinence. Denies headaches, seizures, numbness, or tingling. Denies depression, anxiety or insomnia. Denies skin break down or rash.   PE  BP 130/84 (BP Location: Right Arm, Patient Position: Sitting, Cuff Size: Large)   Pulse 85   Ht 5' (1.524 m)   Wt 192 lb (87.1 kg)   LMP 04/01/2012   SpO2 97%   BMI 37.50 kg/m   Patient alert and oriented and in no cardiopulmonary distress.  Chest: Clear to auscultation bilaterally.  CVS: S1, S2 no murmurs, no S3.Regular rate.  ABD: Soft non tender.   Ext: No edema  MS: Adequate ROM spine, shoulders, hips. right knee tenderness on ROM , limited ROM due to pain , tenderness on palpation of right popliteal area, has palpable pedal pulse, leg warm to touch, skin color appropriate for ethnicity. Appears to be limping on ambulation. Right leg appears bigger that the left   Psych: Good eye contact, normal affect. Memory intact not anxious or depressed appearing.   Assessment & Plan  Acute pain of right knee Pain  localized to the popliteal area.  Stat venous US ordered to rule out DVT  depomedrol injection 80mg  IM given  RX flexeril 5mg  TID PRN MEDROL dose pak 4mg  ordered.

## 2022-06-09 NOTE — Patient Instructions (Addendum)
You were given solumedrol 80mg  injection today , please get you leg ultrasound done as planned .   Take flexeril 5mg  three times daily for your knee pain  Medrol dose pak 4mg  tablets as instructed on the packaging.   It is important that you exercise regularly at least 30 minutes 5 times a week.  Think about what you will eat, plan ahead. Choose " clean, green, fresh or frozen" over canned, processed or packaged foods which are more sugary, salty and fatty. 70 to 75% of food eaten should be vegetables and fruit. Three meals at set times with snacks allowed between meals, but they must be fruit or vegetables. Aim to eat over a 12 hour period , example 7 am to 7 pm, and STOP after  your last meal of the day. Drink water,generally about 64 ounces per day, no other drink is as healthy. Fruit juice is best enjoyed in a healthy way, by EATING the fruit.  Thanks for choosing [facility], we consider it a privelige to serve you.

## 2022-06-10 ENCOUNTER — Ambulatory Visit (HOSPITAL_COMMUNITY)
Admission: RE | Admit: 2022-06-10 | Discharge: 2022-06-10 | Disposition: A | Discharge status: Home / Self Care | Payer: Medicare Other | Source: Ambulatory Visit | Attending: Nurse Practitioner | Admitting: Nurse Practitioner

## 2022-06-10 DIAGNOSIS — M25561 Pain in right knee: Secondary | ICD-10-CM | POA: Diagnosis present

## 2022-06-10 NOTE — Progress Notes (Signed)
Negative for DVT, pt should take the steroid and muscle relaxant ordered yesterday as prescribed, will refer to otho if her pain does not improve

## 2022-06-11 ENCOUNTER — Encounter (HOSPITAL_COMMUNITY): Payer: Self-pay | Admitting: *Deleted

## 2022-06-11 ENCOUNTER — Emergency Department (HOSPITAL_COMMUNITY)
Admission: EM | Admit: 2022-06-11 | Discharge: 2022-06-11 | Disposition: A | Discharge status: Home / Self Care | Payer: Medicare Other | Attending: Emergency Medicine | Admitting: Emergency Medicine

## 2022-06-11 ENCOUNTER — Emergency Department (HOSPITAL_COMMUNITY): Payer: Medicare Other

## 2022-06-11 ENCOUNTER — Other Ambulatory Visit: Payer: Self-pay

## 2022-06-11 DIAGNOSIS — X509XXA Other and unspecified overexertion or strenuous movements or postures, initial encounter: Secondary | ICD-10-CM | POA: Insufficient documentation

## 2022-06-11 DIAGNOSIS — M25561 Pain in right knee: Secondary | ICD-10-CM | POA: Insufficient documentation

## 2022-06-11 NOTE — ED Provider Notes (Signed)
[facility] EMERGENCY DEPARTMENT Provider Note   CSN: [id number] Arrival date & time: 06/11/22  1750     History  Chief Complaint  Patient presents with   Knee Pain    Barbara Bennett is a 66 y.o. female.  HPI Patient presents with 2 family members who assist with history.  She presents due to an episode of feeling as though the right knee was going to give out, hearing a popping noise.  This occurred just prior to ED arrival.  Patient did not fall, has no other injuries or complaints.  She has a history of prior left knee meniscus repair with Dr. Dion Saucier.  She notes that over the past weeks her right knee has been irritated, and she was prescribed a course of steroids starting yesterday.  She also had an ultrasound performed yesterday that excluded DVT.    Home Medications Prior to Admission medications   Medication Sig Start Date End Date Taking? Authorizing Provider  atorvastatin (LIPITOR) 40 MG tablet Take 1 tablet (40 mg total) by mouth daily. 03/09/22   Paseda, Baird Kay, FNP  Cinnamon 500 MG capsule Take 1,000 mg by mouth daily.    [provider]  cyclobenzaprine (FLEXERIL) 5 MG tablet Take 1 tablet (5 mg total) by mouth 3 (three) times daily as needed for muscle spasms. 06/09/22   Donell Beers, FNP  diclofenac Sodium (VOLTAREN) 1 % GEL Apply 4 g topically 4 (four) times daily. 04/22/22   Paseda, Baird Kay, FNP  empagliflozin (JARDIANCE) 10 MG TABS tablet Take 1 tablet (10 mg total) by mouth daily before breakfast. 04/22/22   Paseda, Baird Kay, FNP  irbesartan (AVAPRO) 150 MG tablet TAKE 1 TABLET(150 MG) BY MOUTH DAILY 05/10/22   Donell Beers, FNP  levothyroxine (SYNTHROID) 88 MCG tablet TAKE 1 TABLET(88 MCG) BY MOUTH DAILY 05/12/22   Dani Gobble, NP  melatonin 1 MG TABS tablet Take 1 mg by mouth at bedtime. As needed    [provider]  methylPREDNISolone (MEDROL DOSEPAK) 4 MG TBPK tablet Take as instructed on the packaging 06/09/22   Paseda,  Baird Kay, FNP  Turmeric (QC TUMERIC COMPLEX PO) Take by mouth. Once daily    [provider]      Allergies    Penicillins and Sulfa antibiotics    Review of Systems   Review of Systems  All other systems reviewed and are negative.   Physical Exam Updated Vital Signs BP 134/65 (BP Location: Right Arm)   Pulse 67   Temp 97.7 F (36.5 C) (Oral)   Resp 18   Ht 5' (1.524 m)   Wt 86.2 kg   LMP 04/01/2012   SpO2 100%   BMI 37.11 kg/m  Physical Exam Vitals and nursing note reviewed.  Constitutional:      General: She is not in acute distress.    Appearance: She is well-developed.  HENT:     Head: Normocephalic and atraumatic.  Eyes:     Conjunctiva/sclera: Conjunctivae normal.  Cardiovascular:     Rate and Rhythm: Normal rate and regular rhythm.     Pulses: Normal pulses.  Pulmonary:     Effort: Pulmonary effort is normal. No respiratory distress.     Breath sounds: Normal breath sounds. No stridor.  Abdominal:     General: There is no distension.  Musculoskeletal:       Legs:  Skin:    General: Skin is warm and dry.  Neurological:     Mental  Status: She is alert and oriented to person, place, and time.     Cranial Nerves: No cranial nerve deficit.  Psychiatric:        Mood and Affect: Mood normal.     ED Results / Procedures / Treatments   Labs (all labs ordered are listed, but only abnormal results are displayed) Labs Reviewed - No data to display  EKG None  Radiology DG Knee Complete 4 Views Right  Result Date: 06/11/2022 CLINICAL DATA:  Right knee pain after feeling a pop earlier today. EXAM: RIGHT KNEE - COMPLETE 4+ VIEW COMPARISON:  Right knee x-rays dated June 18, 2009. FINDINGS: No acute fracture or dislocation. No joint effusion. Progressive mild tricompartmental osteoarthritis. Soft tissues are unremarkable. IMPRESSION: 1. No acute osseous abnormality. 2. Progressive mild tricompartmental osteoarthritis. Electronically Signed   By:  Obie Dredge M.D.   On: 06/11/2022 18:51   US Venous Img Lower Unilateral Right (DVT)  Result Date: 06/10/2022 CLINICAL DATA:  Right leg pain for 1 week EXAM: RIGHT LOWER EXTREMITY VENOUS DOPPLER ULTRASOUND TECHNIQUE: Gray-scale sonography with compression, as well as color and duplex ultrasound, were performed to evaluate the deep venous system(s) from the level of the common femoral vein through the popliteal and proximal calf veins. COMPARISON:  Right lower extremity venous ultrasound 06/18/2009 FINDINGS: VENOUS Normal compressibility of the common femoral, superficial femoral, and popliteal veins, as well as the visualized calf veins. Visualized portions of profunda femoral vein and great saphenous vein unremarkable. No filling defects to suggest DVT on grayscale or color Doppler imaging. Doppler waveforms show normal direction of venous flow, normal respiratory plasticity and response to augmentation. Limited views of the contralateral common femoral vein are unremarkable. OTHER None. Limitations: none IMPRESSION: Negative. Electronically Signed   By: Emmaline Kluver M.D.   On: 06/10/2022 10:13    Procedures Procedures    Medications Ordered in ED Medications - No data to display  ED Course/ Medical Decision Making/ A&P This patient with a Hx of left knee meniscus injury presents to the ED for concern of right knee pain following bending, this involves an extensive number of treatment options, and is a complaint that carries with it a high risk of complications and morbidity.    The differential diagnosis includes soft tissue injury such as meniscus or tendon disruption less likely fracture and even less likely DVT with reassuring ultrasound reviewed from yesterday   Social Determinants of Health:  Limitations  Additional history obtained:  Additional history and/or information obtained from family members, notable for history as bedside   After the initial evaluation, orders,  including: X-ray were initiated.    On repeat evaluation of the patient stayed the same Imaging Studies ordered:  I independently visualized and interpreted imaging which showed no fracture.  I reviewed the images at bedside demonstrated the pictures to the patient and her family members I agree with the radiologist interpretation  Dispostion / Final MDM:  After consideration of the diagnostic results and the patient's response to treatment, patient will be discharged with knee immobilizer, crutches.  Suspicion for soft tissue injury given the reassuring x-ray, distal neurovascular preserved status, reassuring ultrasound from yesterday with no DVT.    Final Clinical Impression(s) / ED Diagnoses Final diagnoses:  Acute pain of right knee    Rx / DC Orders ED Discharge Orders     None         Gerhard Munch, MD 06/11/22 1924

## 2022-06-11 NOTE — Discharge Instructions (Signed)
As discussed, today's evaluation has resulted in a diagnosis of knee pain.  This is likely due to irritation of either the meniscus or the quadriceps tendon.  Both are inflammatory disorders, require follow-up with our orthopedic surgery colleagues.  Return here for concerning changes in your condition.

## 2022-06-11 NOTE — ED Triage Notes (Signed)
Pt with right knee pain, what to take a step earlier today and heard a pop.  Pt had an U/S done same knee yesterday. Not able to put weight on knee per pt.

## 2022-06-13 ENCOUNTER — Ambulatory Visit (HOSPITAL_COMMUNITY)
Admission: RE | Admit: 2022-06-13 | Discharge: 2022-06-13 | Disposition: A | Discharge status: Home / Self Care | Payer: Medicare Other | Source: Ambulatory Visit | Attending: Nurse Practitioner | Admitting: Nurse Practitioner

## 2022-06-13 DIAGNOSIS — Z780 Asymptomatic menopausal state: Secondary | ICD-10-CM | POA: Diagnosis present

## 2022-06-14 ENCOUNTER — Other Ambulatory Visit: Payer: Self-pay | Admitting: Nurse Practitioner

## 2022-06-14 DIAGNOSIS — M8588 Other specified disorders of bone density and structure, other site: Secondary | ICD-10-CM | POA: Insufficient documentation

## 2022-06-14 MED ORDER — CALCIUM CARB-CHOLECALCIFEROL 600-20 MG-MCG PO TABS: 600.0000 mg | ORAL_TABLET | Freq: Two times a day (BID) | ORAL | 3 refills | Status: AC

## 2022-06-14 NOTE — Progress Notes (Signed)
Has osteopenia. Start Caltrate with D3  600mg   BID

## 2022-06-20 ENCOUNTER — Ambulatory Visit (INDEPENDENT_AMBULATORY_CARE_PROVIDER_SITE_OTHER): Payer: Medicare Other | Admitting: Nurse Practitioner

## 2022-06-20 ENCOUNTER — Encounter: Payer: Self-pay | Admitting: Nurse Practitioner

## 2022-06-20 VITALS — BP 123/80 | HR 77 | Ht 60.0 in | Wt 191.0 lb

## 2022-06-20 DIAGNOSIS — E063 Autoimmune thyroiditis: Secondary | ICD-10-CM

## 2022-06-20 DIAGNOSIS — E038 Other specified hypothyroidism: Secondary | ICD-10-CM

## 2022-06-20 DIAGNOSIS — E039 Hypothyroidism, unspecified: Secondary | ICD-10-CM | POA: Diagnosis not present

## 2022-06-20 MED ORDER — OZEMPIC (0.25 OR 0.5 MG/DOSE) 2 MG/3ML ~~LOC~~ SOPN: 0.5000 mg | PEN_INJECTOR | SUBCUTANEOUS | 3 refills | Status: DC

## 2022-06-20 MED ORDER — OZEMPIC (0.25 OR 0.5 MG/DOSE) 2 MG/3ML ~~LOC~~ SOPN: 0.2500 mg | PEN_INJECTOR | SUBCUTANEOUS | 3 refills | Status: DC

## 2022-06-20 MED ORDER — LEVOTHYROXINE SODIUM 88 MCG PO TABS
88.0000 ug | ORAL_TABLET | Freq: Every day | ORAL | 3 refills | Status: DC
Start: 2022-06-20 — End: 2022-12-21

## 2022-06-20 NOTE — Progress Notes (Signed)
Endocrinology Follow Up Note                                         06/20/2022, 2:35 PM  Subjective:   Subjective    Barbara Bennett is a 66 y.o.-year-old female patient being seen in follow up after being seen in consultation for hypothyroidism referred by Donell Beers, FNP.  She was a patient of Dr. Karilyn Cota.   Past Medical History:  Diagnosis Date   Anemia    Anemia, unspecified    Arthritis    knees   Bell's palsy    Hyperlipidemia, unspecified    Hypothyroidism, unspecified    Obesity, unspecified    Type 2 diabetes mellitus without complications (HCC)    no longer on medications, previously on metformin for borderline DM, A1C 5.6    Past Surgical History:  Procedure Laterality Date   CATARACT EXTRACTION W/PHACO Left 03/31/2016   Procedure: CATARACT EXTRACTION PHACO AND INTRAOCULAR LENS PLACEMENT (IOC);  Surgeon: Gemma Payor, MD;  Location: [facility];  Service: Ophthalmology;  Laterality: Left;  CDE: 6.45   CATARACT EXTRACTION W/PHACO Right 04/28/2016   Procedure: CATARACT EXTRACTION PHACO AND INTRAOCULAR LENS PLACEMENT (IOC);  Surgeon: Gemma Payor, MD;  Location: [facility];  Service: Ophthalmology;  Laterality: Right;  CDE 5.00   COLONOSCOPY N/A 12/30/2020   Procedure: COLONOSCOPY;  Surgeon: Corbin Ade, MD;  Location: [facility];  Service: Endoscopy;  Laterality: N/A;  9:30am   KNEE ARTHROSCOPY Left     Social History   Socioeconomic History   Marital status: Married    Spouse name: Not on file   Number of children: 2   Years of education: 12   Highest education level: Not on file  Occupational History   Occupation: Retired- worked at Clinical bi[employer]  Tobacco Use   Smoking status: Never   Smokeless tobacco: Never  Vaping Use   Vaping Use: Never used  Substance and Sexual Activity   Alcohol use: No   Drug use: No   Sexual activity: Not Currently  Other Topics Concern   Not on file   Social History Narrative   Married for 26 years,second.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      Son deceased   Daughter lives nearby   Social Determinants of Health   Financial Resource Strain: Low Risk  (10/04/2021)   Overall Financial Resource Strain (CARDIA)    Difficulty of Paying Living Expenses: Not hard at all  Food Insecurity: No Food Insecurity (10/04/2021)   Hunger Vital Sign    Worried About Running Out of Food in the Last Year: Never true    Ran Out of Food in the Last Year: Never true  Transportation Needs: No Transportation Needs (10/04/2021)   PRAPARE - Administrator, Civil Service (Medical): No    Lack of Transportation (Non-Medical): No  Physical Activity: Sufficiently Active (10/04/2021)   Exercise Vital Sign    Days of Exercise per Week: 5 days    Minutes  of Exercise per Session: 30 min  Stress: No Stress Concern Present (10/04/2021)   [employer] of Occupational Health - Occupational Stress Questionnaire    Feeling of Stress : Not at all  Social Connections: Moderately Integrated (10/04/2021)   Social Connection and Isolation Panel [NHANES]    Frequency of Communication with Friends and Family: More than three times a week    Frequency of Social Gatherings with Friends and Family: More than three times a week    Attends Religious Services: More than 4 times per year    Active Member of [employer] or Organizations: No    Attends Banker Meetings: Never    Marital Status: Married    Family History  Problem Relation Age of Onset   Diabetes Mother    Heart disease Mother    Heart disease Father    Diabetes Sister    Renal Disease Sister        Dialysis   Asthma Sister    Hypertension Sister    Diabetes Sister    Arthritis Sister    Diabetes Sister    Renal Disease Sister        Dialysis   Hypertension Sister    Migraines Daughter    Pneumonia Son    Colon cancer Neg Hx    Sickle cell anemia Neg Hx     Thalassemia Neg Hx    Leukemia Neg Hx     Outpatient Encounter Medications as of 06/20/2022  Medication Sig   [DISCONTINUED] Semaglutide,0.25 or 0.5MG /DOS, (OZEMPIC, 0.25 OR 0.5 MG/DOSE,) 2 MG/3ML SOPN Inject 0.25 mg into the skin once a week. For 2 weeks, then increase to 0.5 mg weekly thereafter   atorvastatin (LIPITOR) 40 MG tablet Take 1 tablet (40 mg total) by mouth daily.   Calcium Carb-Cholecalciferol (CALTRATE 600+D3) 600-20 MG-MCG TABS Take 600 mg by mouth 2 (two) times daily.   Cinnamon 500 MG capsule Take 1,000 mg by mouth daily.   diclofenac Sodium (VOLTAREN) 1 % GEL Apply 4 g topically 4 (four) times daily.   empagliflozin (JARDIANCE) 10 MG TABS tablet Take 1 tablet (10 mg total) by mouth daily before breakfast.   irbesartan (AVAPRO) 150 MG tablet TAKE 1 TABLET(150 MG) BY MOUTH DAILY   levothyroxine (SYNTHROID) 88 MCG tablet Take 1 tablet (88 mcg total) by mouth daily before breakfast.   melatonin 1 MG TABS tablet Take 1 mg by mouth at bedtime. As needed   Semaglutide,0.25 or 0.5MG /DOS, (OZEMPIC, 0.25 OR 0.5 MG/DOSE,) 2 MG/3ML SOPN Inject 0.5 mg into the skin once a week. For 2 weeks, then increase to 0.5 mg weekly thereafter   Turmeric (QC TUMERIC COMPLEX PO) Take by mouth. Once daily   [DISCONTINUED] cyclobenzaprine (FLEXERIL) 5 MG tablet Take 1 tablet (5 mg total) by mouth 3 (three) times daily as needed for muscle spasms.   [DISCONTINUED] levothyroxine (SYNTHROID) 88 MCG tablet TAKE 1 TABLET(88 MCG) BY MOUTH DAILY   [DISCONTINUED] methylPREDNISolone (MEDROL DOSEPAK) 4 MG TBPK tablet Take as instructed on the packaging   No facility-administered encounter medications on file as of 06/20/2022.    ALLERGIES: Allergies  Allergen Reactions   Penicillins Rash    Has patient had a PCN reaction causing immediate rash, facial/tongue/throat swelling, SOB or lightheadedness with hypotension: Yes Has patient had a PCN reaction causing severe rash involving mucus membranes or skin  necrosis: No Has patient had a PCN reaction that required hospitalization No Has patient had a PCN reaction occurring within the last 10  years: Yes If all of the above answers are "NO", then may proceed with Cephalosporin use.    Sulfa Antibiotics Rash   VACCINATION STATUS: Immunization History  Administered Date(s) Administered   Fluad Quad(high Dose 65+) 09/29/2020, 08/24/2021   Moderna Sars-Covid-2 Vaccination 01/15/2020, 02/12/2020, 11/09/2020   Pneumococcal Conjugate-13 11/04/2020   Pneumococcal Polysaccharide-23 08/06/2019     Macario Golds  is a patient with the above medical history. she was diagnosed  with hypothyroidism at approximate age of 56 years, which required subsequent initiation of thyroid hormone replacement. she was given various doses of Levothyroxine currently on 88 mcg. she reports compliance to this medication:  Taking it daily, on an empty stomach at least 30 minutes prior to eating/drinking or taking other medications, waiting 4 hrs before taking Calcium supplement, MVIs, or acid reflux medications.   I reviewed patient's thyroid tests:  Lab Results  Component Value Date   TSH 0.279 (L) 05/19/2022   TSH 0.230 (L) 02/16/2022   TSH 0.267 (L) 10/14/2021   TSH 0.02 (L) 01/05/2021   TSH 5.58 (H) 11/04/2020   TSH 4.43 08/19/2020   TSH 5.74 06/06/2018   FREET4 1.54 05/19/2022   FREET4 1.56 02/16/2022   FREET4 1.51 10/14/2021   FREET4 1.2 08/19/2020     Pt denies feeling nodules in neck, hoarseness, dysphagia/odynophagia, SOB with lying down.  she denies family history of thyroid disorders.  No family history of thyroid cancer.  No history of radiation therapy to head or neck.  No recent use of iodine supplements.    I reviewed her chart and she also has a history of DM 2, HTN, HLD, IBS.   Review of systems  Constitutional: + Minimally fluctuating body weight,  current Body mass index is 37.3 kg/m. , no fatigue, no subjective hyperthermia, no  subjective hypothermia Eyes: no blurry vision, no xerophthalmia ENT: no sore throat, no nodules palpated in throat, no dysphagia/odynophagia, no hoarseness Cardiovascular: no chest pain, no shortness of breath, no palpitations, no leg swelling Respiratory: no cough, no shortness of breath Gastrointestinal: no nausea/vomiting/diarrhea Musculoskeletal: no muscle/joint aches Skin: no rashes, no hyperemia Neurological: no tremors, no numbness, no tingling, no dizziness Psychiatric: no depression, no anxiety   Objective:   Objective     BP 123/80   Pulse 77   Ht 5' (1.524 m)   Wt 191 lb (86.6 kg)   LMP 04/01/2012   BMI 37.30 kg/m  Wt Readings from Last 3 Encounters:  06/20/22 191 lb (86.6 kg)  06/11/22 190 lb (86.2 kg)  06/09/22 192 lb (87.1 kg)    BP Readings from Last 3 Encounters:  06/20/22 123/80  06/11/22 134/65  06/09/22 130/84      Physical Exam- Limited  Constitutional:  Body mass index is 37.3 kg/m. , not in acute distress, normal state of mind Eyes:  EOMI, no exophthalmos Neck: Supple Cardiovascular: RRR, no murmurs, rubs, or gallops, no edema Respiratory: Adequate breathing efforts, no crackles, rales, rhonchi, or wheezing Musculoskeletal: no gross deformities, strength intact in all four extremities, no gross restriction of joint movements Skin:  no rashes, no hyperemia Neurological: no tremor with outstretched hands   CMP ( most recent) CMP     Component Value Date/Time   NA 140 06/03/2022 1056   K 4.6 06/03/2022 1056   CL 106 06/03/2022 1056   CO2 22 06/03/2022 1056   GLUCOSE 111 (H) 06/03/2022 1056   GLUCOSE 115 04/05/2021 1420   BUN 15 06/03/2022 1056   CREATININE 1.22 (  H) 06/03/2022 1056   CREATININE 1.03 (H) 04/05/2021 1420   CALCIUM 9.5 06/03/2022 1056   PROT 7.0 03/03/2022 1016   ALBUMIN 4.4 03/03/2022 1016   AST 16 03/03/2022 1016   ALT 12 03/03/2022 1016   ALKPHOS 60 03/03/2022 1016   BILITOT 0.3 03/03/2022 1016   GFRNONAA 51 (L)  01/05/2021 1313   GFRAA 59 (L) 01/05/2021 1313     Diabetic Labs (most recent): Lab Results  Component Value Date   HGBA1C 6.6 (H) 03/03/2022   HGBA1C 6.1 (H) 11/30/2021   HGBA1C 5.4 08/19/2020   MICROALBUR 0.3 08/19/2020   MICROALBUR 0.3 08/15/2017     Lipid Panel ( most recent) Lipid Panel     Component Value Date/Time   CHOL 187 03/03/2022 1016   TRIG 130 03/03/2022 1016   HDL 35 (L) 03/03/2022 1016   CHOLHDL 3.7 08/19/2020 0818   LDLCALC 128 (H) 03/03/2022 1016   LDLCALC 92 08/19/2020 0818   LABVLDL 24 03/03/2022 1016       Lab Results  Component Value Date   TSH 0.279 (L) 05/19/2022   TSH 0.230 (L) 02/16/2022   TSH 0.267 (L) 10/14/2021   TSH 0.02 (L) 01/05/2021   TSH 5.58 (H) 11/04/2020   TSH 4.43 08/19/2020   TSH 5.74 06/06/2018   FREET4 1.54 05/19/2022   FREET4 1.56 02/16/2022   FREET4 1.51 10/14/2021   FREET4 1.2 08/19/2020     Latest Reference Range & Units 11/04/20 14:49 01/05/21 13:13 10/14/21 13:55 02/16/22 11:51 05/19/22 13:56  TSH 0.450 - 4.500 uIU/mL 5.58 (H) 0.02 (L) 0.267 (L) 0.230 (L) 0.279 (L)  Triiodothyronine,Free,Serum 2.0 - 4.4 pg/mL 3.8 5.6 (H) 2.4    T4,Free(Direct) 0.82 - 1.77 ng/dL   8.41 6.60 6.30  Thyroperoxidase Ab SerPl-aCnc 0 - 34 IU/mL   14    Thyroglobulin Antibody 0.0 - 0.9 IU/mL   339.3 (H)    (H): Data is abnormally high (L): Data is abnormally low  Assessment & Plan:   ASSESSMENT / PLAN:  1. Hypothyroidism- r/t Hashimoto's thyroiditis  Her antibody tests showed positive Thyroglobulin antibodies helping to classify her thyroid dysfunction as Hashimoto's thyroiditis.  Her previsit TFTs are consistent with appropriate hormone replacement (TSH mildly suppressed but has no symptoms of over-replacement).  She is advised to continue Levothyroxine 88 mcg po daily before breakfast.  - We discussed about correct intake of levothyroxine, at fasting, with water, separated by at least 30 minutes from breakfast, and separated by  more than 4 hours from calcium, iron, multivitamins, acid reflux medications (PPIs). -Patient is made aware of the fact that thyroid hormone replacement is needed for life, dose to be adjusted by periodic monitoring of thyroid function tests. - Will check thyroid tests before next visit: TSH, free T4, FT3, and antibody testing to assess for autoimmune thyroid dysfunction which will help classify her hypothyroidism further.  2. Type 2 Diabetes without complications  She is currently on Jardiance 10 mg po daily.  She says the medication seems to be working well for her but she says it still costs her about $150 out of pocket each month.  She does not have history of CHF or kidney disease, nor thyroid cancer.   - Nutritional counseling repeated at each appointment due to patients tendency to fall back in to old habits.  - The patient admits there is a room for improvement in their diet and drink choices. -  Suggestion is made for the patient to avoid simple carbohydrates from their  diet including Cakes, Sweet Desserts / Pastries, Ice Cream, Soda (diet and regular), Sweet Tea, Candies, Chips, Cookies, Sweet Pastries, Store Bought Juices, Alcohol in Excess of 1-2 drinks a day, Artificial Sweeteners, Coffee Creamer, and "Sugar-free" Products. This will help patient to have stable blood glucose profile and potentially avoid unintended weight gain.   - I encouraged the patient to switch to unprocessed or minimally processed complex starch and increased protein intake (animal or plant source), fruits, and vegetables.   - Patient is advised to stick to a routine mealtimes to eat 3 meals a day and avoid unnecessary snacks (to snack only to correct hypoglycemia).  I discussed and initiated trial of Ozempic 0.25 mg SQ weekly x 2 weeks, then increase to 0.5 mg SQ weekly thereafter if tolerated well.  She is instructed not to pick up the medication if it is not covered by her insurance as we need to balance cost  with effectiveness.  We also discussed side effects of this medication.  This will take place of her Jardiance.  I gave her sample today and gave instructions on how to self-inject at home properly.      I spent 38 minutes in the care of the patient today including review of labs from Thyroid Function, CMP, and other relevant labs ; imaging/biopsy records (current and previous including abstractions from other facilities); face-to-face time discussing  her lab results and symptoms, medications doses, her options of short and long term treatment based on the latest standards of care / guidelines;   and documenting the encounter.  Telford NabBeverly M Panetta  participated in the discussions, expressed understanding, and voiced agreement with the above plans.  All questions were answered to her satisfaction. she is encouraged to contact clinic should she have any questions or concerns prior to her return visit.   FOLLOW UP PLAN:  Return in about 3 months (around 09/20/2022) for Diabetes F/U with A1c in office, Thyroid follow up, Previsit labs, Bring meter and logs.  Ronny BaconWhitney Heidi Lemay, [facility] LLCFNP-BC [facility] [address] [place], [place] [address] Phone: [phone] Fax: [phone]  06/20/2022, 2:35 PM

## 2022-06-20 NOTE — Patient Instructions (Signed)

## 2022-06-28 ENCOUNTER — Telehealth: Payer: Self-pay | Admitting: Nurse Practitioner

## 2022-06-28 NOTE — Telephone Encounter (Signed)
Spoke with pt she states that her thyroid doctor Dr Fredna Dow wants her to change from jardiance to ozempic and she wants to know that it is ok with pcp first. Please advise

## 2022-06-28 NOTE — Telephone Encounter (Signed)
Patient called in requesting call back.  Has some med questions.

## 2022-06-29 ENCOUNTER — Encounter: Payer: Self-pay | Admitting: Nurse Practitioner

## 2022-06-29 NOTE — Telephone Encounter (Signed)
Spoke with pt advised of fola's message pt verbalized understanding ?

## 2022-06-29 NOTE — Telephone Encounter (Signed)
Spoke with pt

## 2022-08-09 ENCOUNTER — Other Ambulatory Visit: Payer: Self-pay | Admitting: Nurse Practitioner

## 2022-08-09 DIAGNOSIS — I10 Essential (primary) hypertension: Secondary | ICD-10-CM

## 2022-09-02 ENCOUNTER — Ambulatory Visit: Payer: Medicare Other | Admitting: Nurse Practitioner

## 2022-09-02 ENCOUNTER — Ambulatory Visit (INDEPENDENT_AMBULATORY_CARE_PROVIDER_SITE_OTHER): Payer: Medicare Other | Admitting: Family Medicine

## 2022-09-02 ENCOUNTER — Encounter: Payer: Self-pay | Admitting: Family Medicine

## 2022-09-02 VITALS — BP 106/69 | HR 94 | Ht 60.0 in | Wt 184.0 lb

## 2022-09-02 DIAGNOSIS — E038 Other specified hypothyroidism: Secondary | ICD-10-CM

## 2022-09-02 DIAGNOSIS — E559 Vitamin D deficiency, unspecified: Secondary | ICD-10-CM | POA: Diagnosis not present

## 2022-09-02 DIAGNOSIS — E782 Mixed hyperlipidemia: Secondary | ICD-10-CM

## 2022-09-02 DIAGNOSIS — I10 Essential (primary) hypertension: Secondary | ICD-10-CM

## 2022-09-02 DIAGNOSIS — E119 Type 2 diabetes mellitus without complications: Secondary | ICD-10-CM

## 2022-09-02 DIAGNOSIS — Z23 Encounter for immunization: Secondary | ICD-10-CM | POA: Diagnosis not present

## 2022-09-02 NOTE — Progress Notes (Signed)
Established Patient Office Visit  Subjective:  Patient ID: Barbara Bennett, female    DOB: 1956/01/08  Age: 66 y.o. MRN: [id number]  CC:  Chief Complaint  Patient presents with   Follow-up    3 month f/u for htn/dm, may need labs today.     HPI Barbara Bennett is a 66 y.o. female with past medical history of T2DM, HTN, and hyperlipidemia presents for f/u of  chronic medical conditions.  DM: She denies the 3ps of diabetes. She was started on Ozempic in July 2023 by her endocrinologist. She is currently on Ozempic 0.5 mg weekly.   HTN: Controlled. She takes irbesartan 150 mg tablet daily. She denies headaches, dizziness, blurred vision, chest pain, palpitation, and shortness of breath.  Hyperlipidemia: She denies chest pain, palpitations, shortness of breath, and chest tightness. She takes Lipitor 40 mg daily.  Past Medical History:  Diagnosis Date   Anemia    Anemia, unspecified    Arthritis    knees   Bell's palsy    Hyperlipidemia, unspecified    Hypothyroidism, unspecified    Obesity, unspecified    Type 2 diabetes mellitus without complications (HCC)    no longer on medications, previously on metformin for borderline DM, A1C 5.6    Past Surgical History:  Procedure Laterality Date   CATARACT EXTRACTION W/PHACO Left 03/31/2016   Procedure: CATARACT EXTRACTION PHACO AND INTRAOCULAR LENS PLACEMENT ([place]);  Surgeon: Tonny Branch, MD;  Location: [facility];  Service: Ophthalmology;  Laterality: Left;  CDE: 6.45   CATARACT EXTRACTION W/PHACO Right 04/28/2016   Procedure: CATARACT EXTRACTION PHACO AND INTRAOCULAR LENS PLACEMENT ([facility]);  Surgeon: Tonny Branch, MD;  Location: [facility];  Service: Ophthalmology;  Laterality: Right;  CDE 5.00   COLONOSCOPY N/A 12/30/2020   Procedure: COLONOSCOPY;  Surgeon: Daneil Dolin, MD;  Location: [facility];  Service: Endoscopy;  Laterality: N/A;  9:30am   KNEE ARTHROSCOPY Left     Family History  Problem Relation Age of Onset   Diabetes Mother     Heart disease Mother    Heart disease Father    Diabetes Sister    Renal Disease Sister        Dialysis   Asthma Sister    Hypertension Sister    Diabetes Sister    Arthritis Sister    Diabetes Sister    Renal Disease Sister        Dialysis   Hypertension Sister    Migraines Daughter    Pneumonia Son    Colon cancer Neg Hx    Sickle cell anemia Neg Hx    Thalassemia Neg Hx    Leukemia Neg Hx     Social History   Socioeconomic History   Marital status: Married    Spouse name: Not on file   Number of children: 2   Years of education: 12   Highest education level: Not on file  Occupational History   Occupation: Retired- worked at [employer]  Tobacco Use   Smoking status: Never   Smokeless tobacco: Never  Vaping Use   Vaping Use: Never used  Substance and Sexual Activity   Alcohol use: No   Drug use: No   Sexual activity: Not Currently  Other Topics Concern   Not on file  Social History Narrative   Married for 50 years,second.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      Son deceased   Daughter lives nearby   Social Determinants of Radio broadcast assistant  Strain: Low Risk  (10/04/2021)   Overall Financial Resource Strain (CARDIA)    Difficulty of Paying Living Expenses: Not hard at all  Food Insecurity: No Food Insecurity (10/04/2021)   Hunger Vital Sign    Worried About Running Out of Food in the Last Year: Never true    Ran Out of Food in the Last Year: Never true  Transportation Needs: No Transportation Needs (10/04/2021)   PRAPARE - Hydrologist (Medical): No    Lack of Transportation (Non-Medical): No  Physical Activity: Sufficiently Active (10/04/2021)   Exercise Vital Sign    Days of Exercise per Week: 5 days    Minutes of Exercise per Session: 30 min  Stress: No Stress Concern Present (10/04/2021)   [place]    Feeling of Stress  : Not at all  Social Connections: Moderately Integrated (10/04/2021)   Social Connection and Isolation Panel [NHANES]    Frequency of Communication with Friends and Family: More than three times a week    Frequency of Social Gatherings with Friends and Family: More than three times a week    Attends Religious Services: More than 4 times per year    Active Member of [employer] or Organizations: No    Attends Archivist Meetings: Never    Marital Status: Married  Human resources officer Violence: Not At Risk (10/04/2021)   Humiliation, Afraid, Rape, and Kick questionnaire    Fear of Current or Ex-Partner: No    Emotionally Abused: No    Physically Abused: No    Sexually Abused: No    Outpatient Medications Prior to Visit  Medication Sig Dispense Refill   atorvastatin (LIPITOR) 40 MG tablet Take 1 tablet (40 mg total) by mouth daily. 90 tablet 3   Calcium Carb-Cholecalciferol (CALTRATE 600+D3) 600-20 MG-MCG TABS Take 600 mg by mouth 2 (two) times daily. 60 tablet 3   Cinnamon 500 MG capsule Take 1,000 mg by mouth daily.     diclofenac Sodium (VOLTAREN) 1 % GEL Apply 4 g topically 4 (four) times daily. 50 g 1   irbesartan (AVAPRO) 150 MG tablet TAKE 1 TABLET(150 MG) BY MOUTH DAILY 30 tablet 2   levothyroxine (SYNTHROID) 88 MCG tablet Take 1 tablet (88 mcg total) by mouth daily before breakfast. 90 tablet 3   melatonin 1 MG TABS tablet Take 1 mg by mouth at bedtime. As needed     Semaglutide,0.25 or 0.5MG/DOS, (OZEMPIC, 0.25 OR 0.5 MG/DOSE,) 2 MG/3ML SOPN Inject 0.5 mg into the skin once a week. For 2 weeks, then increase to 0.5 mg weekly thereafter 3 mL 3   empagliflozin (JARDIANCE) 10 MG TABS tablet Take 1 tablet (10 mg total) by mouth daily before breakfast. 30 tablet 1   Turmeric (QC TUMERIC COMPLEX PO) Take by mouth. Once daily     No facility-administered medications prior to visit.    Allergies  Allergen Reactions   Penicillins Rash    Has patient had a PCN reaction causing  immediate rash, facial/tongue/throat swelling, SOB or lightheadedness with hypotension: Yes Has patient had a PCN reaction causing severe rash involving mucus membranes or skin necrosis: No Has patient had a PCN reaction that required hospitalization No Has patient had a PCN reaction occurring within the last 10 years: Yes If all of the above answers are "NO", then may proceed with Cephalosporin use.    Sulfa Antibiotics Rash    ROS Review of Systems  Constitutional:  Negative for chills, fatigue and fever.  HENT:  Negative for congestion, rhinorrhea and sinus pain.   Eyes:  Negative for photophobia, redness and visual disturbance.  Respiratory:  Negative for chest tightness and shortness of breath.   Cardiovascular:  Negative for chest pain and palpitations.  Gastrointestinal:  Negative for diarrhea, nausea and vomiting.  Endocrine: Negative for cold intolerance and heat intolerance.  Neurological:  Negative for dizziness and headaches.  Psychiatric/Behavioral:  Negative for self-injury and suicidal ideas.       Objective:    Physical Exam HENT:     Head: Normocephalic.     Right Ear: External ear normal.     Left Ear: External ear normal.  Cardiovascular:     Rate and Rhythm: Normal rate and regular rhythm.     Pulses: Normal pulses.     Heart sounds: Normal heart sounds.  Pulmonary:     Effort: Pulmonary effort is normal.     Breath sounds: Normal breath sounds.  Neurological:     Mental Status: She is alert.     BP 106/69   Pulse 94   Ht 5' (1.524 m)   Wt 184 lb (83.5 kg)   LMP 04/01/2012   SpO2 93%   BMI 35.94 kg/m  Wt Readings from Last 3 Encounters:  09/02/22 184 lb (83.5 kg)  06/20/22 191 lb (86.6 kg)  06/11/22 190 lb (86.2 kg)    Lab Results  Component Value Date   TSH 0.279 (L) 05/19/2022   Lab Results  Component Value Date   WBC 5.6 03/03/2022   HGB 10.6 (L) 03/03/2022   HCT 31.9 (L) 03/03/2022   MCV 82 03/03/2022   PLT 180 03/03/2022    Lab Results  Component Value Date   NA 140 06/03/2022   K 4.6 06/03/2022   CO2 22 06/03/2022   GLUCOSE 111 (H) 06/03/2022   BUN 15 06/03/2022   CREATININE 1.22 (H) 06/03/2022   BILITOT 0.3 03/03/2022   ALKPHOS 60 03/03/2022   AST 16 03/03/2022   ALT 12 03/03/2022   PROT 7.0 03/03/2022   ALBUMIN 4.4 03/03/2022   CALCIUM 9.5 06/03/2022   ANIONGAP 9 03/25/2016   EGFR 48 (L) 06/03/2022   Lab Results  Component Value Date   CHOL 187 03/03/2022   Lab Results  Component Value Date   HDL 35 (L) 03/03/2022   Lab Results  Component Value Date   LDLCALC 128 (H) 03/03/2022   Lab Results  Component Value Date   TRIG 130 03/03/2022   Lab Results  Component Value Date   CHOLHDL 3.7 08/19/2020   Lab Results  Component Value Date   HGBA1C 6.6 (H) 03/03/2022      Assessment & Plan:   Problem List Items Addressed This Visit       Cardiovascular and Mediastinum   Essential (primary) hypertension    Controlled She takes irbesartan 150 mg tablet daily She denies headaches, dizziness, blurred vision, chest pain, palpitation, and shortness of breath Encouraged to continue treatment regimen BP Readings from Last 3 Encounters:  09/02/22 106/69  06/20/22 123/80  06/11/22 134/65          Endocrine   Hypothyroidism, unspecified    She takes Synthroid 88 mcg daily Encouraged to continue treatment regimen Lab Results  Component Value Date   TSH 0.279 (L) 05/19/2022        Type 2 diabetes mellitus without complications ([place]) - Primary    She denies the 3ps of diabetes She  was started on Ozempic in July 2023 by her endocrinologist She is currently on Ozempic 0.5 mg weekly Lab Results  Component Value Date   HGBA1C 6.6 (H) 03/03/2022       Relevant Orders   CBC with Differential/Platelet   CMP14+EGFR   TSH + free T4   Lipid Profile   Hemoglobin A1C     Other   Hyperlipidemia, unspecified    She denies chest pain, palpitations, shortness of breath, and  chest tightness She takes Lipitor 40 mg daily Encouraged to continue treatment regimen      Need for immunization against influenza    Patient educated on CDC recommendation for the vaccine. Verbal consent was obtained from the patient, vaccine administered by nurse, no sign of adverse reactions noted at this time. Patient education on arm soreness and use of tylenol or ibuprofen for this patient  was discussed. Patient educated on the signs and symptoms of adverse effect and advise to contact the office if they occur.      Other Visit Diagnoses     Flu vaccine need       Relevant Orders   Flu Vaccine QUAD High Dose(Fluad)   Vitamin D deficiency       Relevant Orders   Vitamin D (25 hydroxy)       No orders of the defined types were placed in this encounter.   Follow-up: Return in about 4 months (around 01/02/2023).    Alvira Monday, FNP

## 2022-09-02 NOTE — Assessment & Plan Note (Addendum)
She takes Synthroid 88 mcg daily Encouraged to continue treatment regimen Lab Results  Component Value Date   TSH 0.279 (L) 05/19/2022

## 2022-09-02 NOTE — Assessment & Plan Note (Signed)
Patient educated on CDC recommendation for the vaccine. Verbal consent was obtained from the patient, vaccine administered by nurse, no sign of adverse reactions noted at this time. Patient education on arm soreness and use of tylenol or ibuprofen for this patient  was discussed. Patient educated on the signs and symptoms of adverse effect and advise to contact the office if they occur.  

## 2022-09-02 NOTE — Assessment & Plan Note (Signed)
She denies the 3ps of diabetes She was started on Ozempic in July 2023 by her endocrinologist She is currently on Ozempic 0.5 mg weekly Lab Results  Component Value Date   HGBA1C 6.6 (H) 03/03/2022

## 2022-09-02 NOTE — Patient Instructions (Signed)
I appreciate the opportunity to provide care to you today!    Follow up:  4 months  Fasting Labs: please stop by the lab today to get your blood drawn (CBC, CMP, TSH, Lipid profile, HgA1c, Vit D)    Please continue to a heart-healthy diet and increase your physical activities. Try to exercise for 30mins at least three times a week.      It was a pleasure to see you and I look forward to continuing to work together on your health and well-being. Please do not hesitate to call the office if you need care or have questions about your care.   Have a wonderful day and week. With Gratitude, Gustav Knueppel MSN, FNP-BC  

## 2022-09-02 NOTE — Assessment & Plan Note (Addendum)
Controlled She takes irbesartan 150 mg tablet daily She denies headaches, dizziness, blurred vision, chest pain, palpitation, and shortness of breath Encouraged to continue treatment regimen BP Readings from Last 3 Encounters:  09/02/22 106/69  06/20/22 123/80  06/11/22 134/65

## 2022-09-02 NOTE — Assessment & Plan Note (Signed)
She denies chest pain, palpitations, shortness of breath, and chest tightness She takes Lipitor 40 mg daily Encouraged to continue treatment regimen

## 2022-09-03 LAB — CMP14+EGFR
ALT: 12 IU/L (ref 0–32)
AST: 17 IU/L (ref 0–40)
Albumin/Globulin Ratio: 1.8 (ref 1.2–2.2)
Albumin: 4.4 g/dL (ref 3.9–4.9)
Alkaline Phosphatase: 47 IU/L (ref 44–121)
BUN/Creatinine Ratio: 14 (ref 12–28)
BUN: 18 mg/dL (ref 8–27)
Bilirubin Total: 0.4 mg/dL (ref 0.0–1.2)
CO2: 22 mmol/L (ref 20–29)
Calcium: 10.2 mg/dL (ref 8.7–10.3)
Chloride: 101 mmol/L (ref 96–106)
Creatinine, Ser: 1.26 mg/dL — ABNORMAL HIGH (ref 0.57–1.00)
Globulin, Total: 2.4 g/dL (ref 1.5–4.5)
Glucose: 103 mg/dL — ABNORMAL HIGH (ref 70–99)
Potassium: 4.8 mmol/L (ref 3.5–5.2)
Sodium: 139 mmol/L (ref 134–144)
Total Protein: 6.8 g/dL (ref 6.0–8.5)
eGFR: 47 mL/min/{1.73_m2} — ABNORMAL LOW (ref >59)

## 2022-09-03 LAB — CBC WITH DIFFERENTIAL/PLATELET
Basophils Absolute: 0 10*3/uL (ref 0.0–0.2)
Basos: 1 %
EOS (ABSOLUTE): 0.2 10*3/uL (ref 0.0–0.4)
Eos: 3 %
Hematocrit: 32.5 % — ABNORMAL LOW (ref 34.0–46.6)
Hemoglobin: 10.8 g/dL — ABNORMAL LOW (ref 11.1–15.9)
Immature Grans (Abs): 0 10*3/uL (ref 0.0–0.1)
Immature Granulocytes: 0 %
Lymphocytes Absolute: 2.1 10*3/uL (ref 0.7–3.1)
Lymphs: 33 %
MCH: 27.7 pg (ref 26.6–33.0)
MCHC: 33.2 g/dL (ref 31.5–35.7)
MCV: 83 fL (ref 79–97)
Monocytes Absolute: 0.6 10*3/uL (ref 0.1–0.9)
Monocytes: 9 %
Neutrophils Absolute: 3.5 10*3/uL (ref 1.4–7.0)
Neutrophils: 54 %
Platelets: 187 10*3/uL (ref 150–450)
RBC: 3.9 x10E6/uL (ref 3.77–5.28)
RDW: 13 % (ref 11.7–15.4)
WBC: 6.5 10*3/uL (ref 3.4–10.8)

## 2022-09-03 LAB — LIPID PANEL
Chol/HDL Ratio: 3.8 ratio (ref 0.0–4.4)
Cholesterol, Total: 141 mg/dL (ref 100–199)
HDL: 37 mg/dL — ABNORMAL LOW (ref >39)
LDL Chol Calc (NIH): 86 mg/dL (ref 0–99)
Triglycerides: 94 mg/dL (ref 0–149)
VLDL Cholesterol Cal: 18 mg/dL (ref 5–40)

## 2022-09-03 LAB — TSH+FREE T4
Free T4: 1.41 ng/dL (ref 0.82–1.77)
TSH: 1.89 u[IU]/mL (ref 0.450–4.500)

## 2022-09-03 LAB — HEMOGLOBIN A1C
Est. average glucose Bld gHb Est-mCnc: 114 mg/dL
Hgb A1c MFr Bld: 5.6 % (ref 4.8–5.6)

## 2022-09-03 LAB — VITAMIN D 25 HYDROXY (VIT D DEFICIENCY, FRACTURES): Vit D, 25-Hydroxy: 33.7 ng/mL (ref 30.0–100.0)

## 2022-09-05 NOTE — Progress Notes (Signed)
Please inform the patient to increase her fluid intake and her intake of fruits and vegetables.All other labs are stable. Congratulate her on reducing her sugar intake because her hga1c has decreased from 6.6 to 5.6

## 2022-09-12 ENCOUNTER — Telehealth: Payer: Self-pay | Admitting: Family Medicine

## 2022-09-12 ENCOUNTER — Other Ambulatory Visit: Payer: Self-pay

## 2022-09-12 NOTE — Telephone Encounter (Signed)
Pt returning call

## 2022-09-13 NOTE — Telephone Encounter (Signed)
No message where pt has been called.

## 2022-09-20 ENCOUNTER — Ambulatory Visit (INDEPENDENT_AMBULATORY_CARE_PROVIDER_SITE_OTHER): Payer: Medicare Other | Admitting: Nurse Practitioner

## 2022-09-20 ENCOUNTER — Encounter: Payer: Self-pay | Admitting: Nurse Practitioner

## 2022-09-20 VITALS — BP 106/71 | HR 80 | Ht 60.0 in | Wt 182.8 lb

## 2022-09-20 DIAGNOSIS — E063 Autoimmune thyroiditis: Secondary | ICD-10-CM | POA: Diagnosis not present

## 2022-09-20 DIAGNOSIS — E038 Other specified hypothyroidism: Secondary | ICD-10-CM

## 2022-09-20 DIAGNOSIS — E119 Type 2 diabetes mellitus without complications: Secondary | ICD-10-CM

## 2022-09-20 MED ORDER — SEMAGLUTIDE (1 MG/DOSE) 4 MG/3ML ~~LOC~~ SOPN: 1.0000 mg | PEN_INJECTOR | SUBCUTANEOUS | 3 refills | Status: DC

## 2022-09-20 NOTE — Patient Instructions (Signed)

## 2022-09-20 NOTE — Progress Notes (Signed)
Endocrinology Follow Up Note                                         09/20/2022, 2:08 PM  Subjective:   Subjective    Barbara Bennett is a 66 y.o.-year-old female patient being seen in follow up after being seen in consultation for hypothyroidism referred by Barbara LarocheZarwolo, Gloria, FNP.  She was a patient of Dr. Karilyn Bennett.   Past Medical History:  Diagnosis Date   Anemia    Anemia, unspecified    Arthritis    knees   Bell's palsy    Hyperlipidemia, unspecified    Hypothyroidism, unspecified    Obesity, unspecified    Type 2 diabetes mellitus without complications (HCC)    no longer on medications, previously on metformin for borderline DM, A1C 5.6    Past Surgical History:  Procedure Laterality Date   CATARACT EXTRACTION W/PHACO Left 03/31/2016   Procedure: CATARACT EXTRACTION PHACO AND INTRAOCULAR LENS PLACEMENT (IOC);  Surgeon: Barbara Bennett;  Location: [facility];  Service: Ophthalmology;  Laterality: Left;  CDE: 6.45   CATARACT EXTRACTION W/PHACO Right 04/28/2016   Procedure: CATARACT EXTRACTION PHACO AND INTRAOCULAR LENS PLACEMENT (IOC);  Surgeon: Barbara Bennett;  Location: [facility];  Service: Ophthalmology;  Laterality: Right;  CDE 5.00   COLONOSCOPY N/A 12/30/2020   Procedure: COLONOSCOPY;  Surgeon: Barbara Bennett;  Location: [facility];  Service: Endoscopy;  Laterality: N/A;  9:30am   KNEE ARTHROSCOPY Left     Social History   Socioeconomic History   Marital status: Married    Spouse name: Not on file   Number of children: 2   Years of education: 12   Highest education level: Not on file  Occupational History   Occupation: Retired- worked at [employer]  Tobacco Use   Smoking status: Never   Smokeless tobacco: Never  Vaping Use   Vaping Use: Never used  Substance and Sexual Activity   Alcohol use: No   Drug use: No   Sexual activity: Not Currently  Other Topics Concern   Not on file  Social  History Narrative   Married for 26 years,second.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      Son deceased   Daughter lives nearby   Social Determinants of Health   Financial Resource Strain: Low Risk  (10/04/2021)   Overall Financial Resource Strain (CARDIA)    Difficulty of Paying Living Expenses: Not hard at all  Food Insecurity: No Food Insecurity (10/04/2021)   Hunger Vital Sign    Worried About Running Out of Food in the Last Year: Never true    Ran Out of Food in the Last Year: Never true  Transportation Needs: No Transportation Needs (10/04/2021)   PRAPARE - Administrator, Civil ServiceTransportation    Lack of Transportation (Medical): No    Lack of Transportation (Non-Medical): No  Physical Activity: Sufficiently Active (10/04/2021)   Exercise Vital Sign    Days of Exercise per Week: 5 days    Minutes of  Exercise per Session: 30 min  Stress: No Stress Concern Present (10/04/2021)   [place]    Feeling of Stress : Not at all  Social Connections: Moderately Integrated (10/04/2021)   Social Connection and Isolation Panel [NHANES]    Frequency of Communication with Friends and Family: More than three times a week    Frequency of Social Gatherings with Friends and Family: More than three times a week    Attends Religious Services: More than 4 times per year    Active Member of Clubs or Organizations: No    Attends Archivist Meetings: Never    Marital Status: Married    Family History  Problem Relation Age of Onset   Diabetes Mother    Heart disease Mother    Heart disease Father    Diabetes Sister    Renal Disease Sister        Dialysis   Asthma Sister    Hypertension Sister    Diabetes Sister    Arthritis Sister    Diabetes Sister    Renal Disease Sister        Dialysis   Hypertension Sister    Migraines Daughter    Pneumonia Son    Colon cancer Neg Hx    Sickle cell anemia Neg Hx     Thalassemia Neg Hx    Leukemia Neg Hx     Outpatient Encounter Medications as of 09/20/2022  Medication Sig   atorvastatin (LIPITOR) 40 MG tablet Take 1 tablet (40 mg total) by mouth daily.   Calcium Carb-Cholecalciferol (CALTRATE 600+D3) 600-20 MG-MCG TABS Take 600 mg by mouth 2 (two) times daily.   Cinnamon 500 MG capsule Take 1,000 mg by mouth daily.   diclofenac Sodium (VOLTAREN) 1 % GEL Apply 4 g topically 4 (four) times daily.   irbesartan (AVAPRO) 150 MG tablet TAKE 1 TABLET(150 MG) BY MOUTH DAILY   levothyroxine (SYNTHROID) 88 MCG tablet Take 1 tablet (88 mcg total) by mouth daily before breakfast.   melatonin 1 MG TABS tablet Take 1 mg by mouth at bedtime. As needed   Semaglutide,0.25 or 0.5MG /DOS, (OZEMPIC, 0.25 OR 0.5 MG/DOSE,) 2 MG/3ML SOPN Inject 0.5 mg into the skin once a week. For 2 weeks, then increase to 0.5 mg weekly thereafter   No facility-administered encounter medications on file as of 09/20/2022.    ALLERGIES: Allergies  Allergen Reactions   Penicillins Rash    Has patient had a PCN reaction causing immediate rash, facial/tongue/throat swelling, SOB or lightheadedness with hypotension: Yes Has patient had a PCN reaction causing severe rash involving mucus membranes or skin necrosis: No Has patient had a PCN reaction that required hospitalization No Has patient had a PCN reaction occurring within the last 10 years: Yes If all of the above answers are "NO", then may proceed with Cephalosporin use.    Sulfa Antibiotics Rash   VACCINATION STATUS: Immunization History  Administered Date(s) Administered   Fluad Quad(high Dose 65+) 09/29/2020, 08/24/2021, 09/02/2022   Moderna Sars-Covid-2 Vaccination 01/15/2020, 02/12/2020, 11/09/2020   Pneumococcal Conjugate-13 11/04/2020   Pneumococcal Polysaccharide-23 08/06/2019     Barbara Bennett  is a patient with the above medical history. she was diagnosed  with hypothyroidism at approximate age of 45 years, which  required subsequent initiation of thyroid hormone replacement. she was given various doses of Levothyroxine currently on 88 mcg. she reports compliance to this medication:  Taking it daily, on an empty stomach at  least 30 minutes prior to eating/drinking or taking other medications, waiting 4 hrs before taking Calcium supplement, MVIs, or acid reflux medications.   I reviewed patient's thyroid tests:  Bennett Results  Component Value Date   TSH 1.890 09/02/2022   TSH 0.279 (L) 05/19/2022   TSH 0.230 (L) 02/16/2022   TSH 0.267 (L) 10/14/2021   TSH 0.02 (L) 01/05/2021   TSH 5.58 (H) 11/04/2020   TSH 4.43 08/19/2020   TSH 5.74 06/06/2018   FREET4 1.41 09/02/2022   FREET4 1.54 05/19/2022   FREET4 1.56 02/16/2022   FREET4 1.51 10/14/2021   FREET4 1.2 08/19/2020     Pt denies feeling nodules in neck, hoarseness, dysphagia/odynophagia, SOB with lying down.  she denies family history of thyroid disorders.  No family history of thyroid cancer.  No history of radiation therapy to head or neck.  No recent use of iodine supplements.    I reviewed her chart and she also has a history of DM 2, HTN, HLD, IBS.   Review of systems  Constitutional: + Minimally fluctuating body weight,  current Body mass index is 35.7 kg/m. , no fatigue, no subjective hyperthermia, no subjective hypothermia Eyes: no blurry vision, no xerophthalmia ENT: no sore throat, no nodules palpated in throat, no dysphagia/odynophagia, no hoarseness Cardiovascular: no chest pain, no shortness of breath, no palpitations, no leg swelling Respiratory: no cough, no shortness of breath Gastrointestinal: no nausea/vomiting/diarrhea Musculoskeletal: no muscle/joint aches Skin: no rashes, no hyperemia Neurological: no tremors, no numbness, no tingling, no dizziness Psychiatric: no depression, no anxiety   Objective:   Objective     BP 106/71 (BP Location: Left Arm, Patient Position: Sitting, Cuff Size: Large)   Pulse 80    Ht 5' (1.524 m)   Wt 182 lb 12.8 oz (82.9 kg)   LMP 04/01/2012   BMI 35.70 kg/m  Wt Readings from Last 3 Encounters:  09/20/22 182 lb 12.8 oz (82.9 kg)  09/02/22 184 lb (83.5 kg)  06/20/22 191 lb (86.6 kg)    BP Readings from Last 3 Encounters:  09/20/22 106/71  09/02/22 106/69  06/20/22 123/80      Physical Exam- Limited  Constitutional:  Body mass index is 35.7 kg/m. , not in acute distress, normal state of mind Eyes:  EOMI, no exophthalmos Neck: Supple Cardiovascular: RRR, no murmurs, rubs, or gallops, no edema Respiratory: Adequate breathing efforts, no crackles, rales, rhonchi, or wheezing Musculoskeletal: no gross deformities, strength intact in all four extremities, no gross restriction of joint movements Skin:  no rashes, no hyperemia Neurological: no tremor with outstretched hands   CMP ( most recent) CMP     Component Value Date/Time   NA 139 09/02/2022 0930   K 4.8 09/02/2022 0930   CL 101 09/02/2022 0930   CO2 22 09/02/2022 0930   GLUCOSE 103 (H) 09/02/2022 0930   GLUCOSE 115 04/05/2021 1420   BUN 18 09/02/2022 0930   CREATININE 1.26 (H) 09/02/2022 0930   CREATININE 1.03 (H) 04/05/2021 1420   CALCIUM 10.2 09/02/2022 0930   PROT 6.8 09/02/2022 0930   ALBUMIN 4.4 09/02/2022 0930   AST 17 09/02/2022 0930   ALT 12 09/02/2022 0930   ALKPHOS 47 09/02/2022 0930   BILITOT 0.4 09/02/2022 0930   GFRNONAA 51 (L) 01/05/2021 1313   GFRAA 59 (L) 01/05/2021 1313     Diabetic Labs (most recent): Bennett Results  Component Value Date   HGBA1C 5.6 09/02/2022   HGBA1C 6.6 (H) 03/03/2022   HGBA1C 6.1 (H)  11/30/2021   MICROALBUR 0.3 08/19/2020   MICROALBUR 0.3 08/15/2017     Lipid Panel ( most recent) Lipid Panel     Component Value Date/Time   CHOL 141 09/02/2022 0930   TRIG 94 09/02/2022 0930   HDL 37 (L) 09/02/2022 0930   CHOLHDL 3.8 09/02/2022 0930   CHOLHDL 3.7 08/19/2020 0818   LDLCALC 86 09/02/2022 0930   LDLCALC 92 08/19/2020 0818   LABVLDL 18  09/02/2022 0930       Bennett Results  Component Value Date   TSH 1.890 09/02/2022   TSH 0.279 (L) 05/19/2022   TSH 0.230 (L) 02/16/2022   TSH 0.267 (L) 10/14/2021   TSH 0.02 (L) 01/05/2021   TSH 5.58 (H) 11/04/2020   TSH 4.43 08/19/2020   TSH 5.74 06/06/2018   FREET4 1.41 09/02/2022   FREET4 1.54 05/19/2022   FREET4 1.56 02/16/2022   FREET4 1.51 10/14/2021   FREET4 1.2 08/19/2020     Latest Reference Range & Units 11/04/20 14:49 01/05/21 13:13 10/14/21 13:55 02/16/22 11:51 05/19/22 13:56  TSH 0.450 - 4.500 uIU/mL 5.58 (H) 0.02 (L) 0.267 (L) 0.230 (L) 0.279 (L)  Triiodothyronine,Free,Serum 2.0 - 4.4 pg/mL 3.8 5.6 (H) 2.4    T4,Free(Direct) 0.82 - 1.77 ng/dL   9.62 9.52 8.41  Thyroperoxidase Ab SerPl-aCnc 0 - 34 IU/mL   14    Thyroglobulin Antibody 0.0 - 0.9 IU/mL   339.3 (H)    (H): Data is abnormally high (L): Data is abnormally low  Assessment & Plan:   ASSESSMENT / PLAN:  1. Hypothyroidism- r/t Hashimoto's thyroiditis  Her antibody tests showed positive Thyroglobulin antibodies helping to classify her thyroid dysfunction as Hashimoto's thyroiditis.  Her previsit TFTs are consistent with appropriate hormone replacement (TSH mildly suppressed but has no symptoms of over-replacement).  She is advised to continue Levothyroxine 88 mcg po daily before breakfast.  - We discussed about correct intake of levothyroxine, at fasting, with water, separated by at least 30 minutes from breakfast, and separated by more than 4 hours from calcium, iron, multivitamins, acid reflux medications (PPIs). -Patient is made aware of the fact that thyroid hormone replacement is needed for life, dose to be adjusted by periodic monitoring of thyroid function tests. - Will check thyroid tests before next visit: TSH, free T4, FT3, and antibody testing to assess for autoimmune thyroid dysfunction which will help classify her hypothyroidism further.  2. Type 2 Diabetes without complications  She is  currently on Jardiance 10 mg po daily.  She says the medication seems to be working well for her but she says it still costs her about $150 out of pocket each month.  She does not have history of CHF or kidney disease, nor thyroid cancer.   - Nutritional counseling repeated at each appointment due to patients tendency to fall back in to old habits.  - The patient admits there is a room for improvement in their diet and drink choices. -  Suggestion is made for the patient to avoid simple carbohydrates from their diet including Cakes, Sweet Desserts / Pastries, Ice Cream, Soda (diet and regular), Sweet Tea, Candies, Chips, Cookies, Sweet Pastries, Store Bought Juices, Alcohol in Excess of 1-2 drinks a day, Artificial Sweeteners, Coffee Creamer, and "Sugar-free" Products. This will help patient to have stable blood glucose profile and potentially avoid unintended weight gain.   - I encouraged the patient to switch to unprocessed or minimally processed complex starch and increased protein intake (animal or plant source), fruits, and vegetables.   -  Patient is advised to stick to a routine mealtimes to eat 3 meals a day and avoid unnecessary snacks (to snack only to correct hypoglycemia).  I discussed and initiated trial of Ozempic 0.25 mg SQ weekly x 2 weeks, then increase to 0.5 mg SQ weekly thereafter if tolerated well.  She is instructed not to pick up the medication if it is not covered by her insurance as we need to balance cost with effectiveness.  We also discussed side effects of this medication.  This will take place of her Jardiance.  I gave her sample today and gave instructions on how to self-inject at home properly.      I spent 38 minutes in the care of the patient today including review of labs from Thyroid Function, CMP, and other relevant labs ; imaging/biopsy records (current and previous including abstractions from other facilities); face-to-face time discussing  her Bennett results and  symptoms, medications doses, her options of short and long term treatment based on the latest standards of care / guidelines;   and documenting the encounter.  Telford Nab Cina  participated in the discussions, expressed understanding, and voiced agreement with the above plans.  All questions were answered to her satisfaction. she is encouraged to contact clinic should she have any questions or concerns prior to her return visit.   FOLLOW UP PLAN:  No follow-ups on file.  Ronny Bacon, [facility] [address] [place], [place] [address] Phone: [phone] Fax: [phone]  09/20/2022, 2:08 PM

## 2022-09-20 NOTE — Progress Notes (Signed)
Endocrinology Follow Up Note                                         09/20/2022, 2:29 PM  Subjective:   Subjective    Barbara Bennett is a 66 y.o.-year-old female patient being seen in follow up after being seen in consultation for hypothyroidism referred by Alvira Monday, [place].  She was a patient of Dr. Anastasio Champion.   Past Medical History:  Diagnosis Date   Anemia    Anemia, unspecified    Arthritis    knees   Bell's palsy    Hyperlipidemia, unspecified    Hypothyroidism, unspecified    Obesity, unspecified    Type 2 diabetes mellitus without complications (HCC)    no longer on medications, previously on metformin for borderline DM, A1C 5.6    Past Surgical History:  Procedure Laterality Date   CATARACT EXTRACTION W/PHACO Left 03/31/2016   Procedure: CATARACT EXTRACTION PHACO AND INTRAOCULAR LENS PLACEMENT ([place]);  Surgeon: Tonny Branch, MD;  Location: [facility];  Service: Ophthalmology;  Laterality: Left;  CDE: 6.45   CATARACT EXTRACTION W/PHACO Right 04/28/2016   Procedure: CATARACT EXTRACTION PHACO AND INTRAOCULAR LENS PLACEMENT ([facility]);  Surgeon: Tonny Branch, MD;  Location: [facility];  Service: Ophthalmology;  Laterality: Right;  CDE 5.00   COLONOSCOPY N/A 12/30/2020   Procedure: COLONOSCOPY;  Surgeon: Daneil Dolin, MD;  Location: [facility];  Service: Endoscopy;  Laterality: N/A;  9:30am   KNEE ARTHROSCOPY Left     Social History   Socioeconomic History   Marital status: Married    Spouse name: Not on file   Number of children: 2   Years of education: 12   Highest education level: Not on file  Occupational History   Occupation: Retired- worked at [employer] Use   Smoking status: Never   Smokeless tobacco: Never  Vaping Use   Vaping Use: Never used  Substance and Sexual Activity   Alcohol use: No   Drug use: No   Sexual activity: Not Currently  Other Topics Concern   Not on file  Social  History Narrative   Married for 70 years,second.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      Son deceased   Daughter lives nearby   Social Determinants of Health   Financial Resource Strain: [place]  (10/04/2021)   Overall Financial Resource Strain (CARDIA)    Difficulty of Paying Living Expenses: Not hard at all  Food Insecurity: No Food Insecurity (10/04/2021)   Hunger Vital Sign    Worried About Running Out of Food in the Last Year: Never true    Ran Out of Food in the Last Year: Never true  Transportation Needs: No Transportation Needs (10/04/2021)   PRAPARE - Hydrologist (Medical): No    Lack of Transportation (Non-Medical): No  Physical Activity: Sufficiently Active (10/04/2021)   Exercise Vital Sign    Days of Exercise per Week: 5 days    Minutes of  Exercise per Session: 30 min  Stress: No Stress Concern Present (10/04/2021)   [employer] of Occupational Health - Occupational Stress Questionnaire    Feeling of Stress : Not at all  Social Connections: Moderately Integrated (10/04/2021)   Social Connection and Isolation Panel [NHANES]    Frequency of Communication with Friends and Family: More than three times a week    Frequency of Social Gatherings with Friends and Family: More than three times a week    Attends Religious Services: More than 4 times per year    Active Member of Clubs or Organizations: No    Attends Banker Meetings: Never    Marital Status: Married    Family History  Problem Relation Age of Onset   Diabetes Mother    Heart disease Mother    Heart disease Father    Diabetes Sister    Renal Disease Sister        Dialysis   Asthma Sister    Hypertension Sister    Diabetes Sister    Arthritis Sister    Diabetes Sister    Renal Disease Sister        Dialysis   Hypertension Sister    Migraines Daughter    Pneumonia Son    Colon cancer Neg Hx    Sickle cell anemia Neg Hx     Thalassemia Neg Hx    Leukemia Neg Hx     Outpatient Encounter Medications as of 09/20/2022  Medication Sig   atorvastatin (LIPITOR) 40 MG tablet Take 1 tablet (40 mg total) by mouth daily.   Calcium Carb-Cholecalciferol (CALTRATE 600+D3) 600-20 MG-MCG TABS Take 600 mg by mouth 2 (two) times daily.   Cinnamon 500 MG capsule Take 1,000 mg by mouth daily.   diclofenac Sodium (VOLTAREN) 1 % GEL Apply 4 g topically 4 (four) times daily.   irbesartan (AVAPRO) 150 MG tablet TAKE 1 TABLET(150 MG) BY MOUTH DAILY   levothyroxine (SYNTHROID) 88 MCG tablet Take 1 tablet (88 mcg total) by mouth daily before breakfast.   melatonin 1 MG TABS tablet Take 1 mg by mouth at bedtime. As needed   Semaglutide, 1 MG/DOSE, 4 MG/3ML SOPN Inject 1 mg as directed once a week.   [DISCONTINUED] Semaglutide,0.25 or 0.5MG /DOS, (OZEMPIC, 0.25 OR 0.5 MG/DOSE,) 2 MG/3ML SOPN Inject 0.5 mg into the skin once a week. For 2 weeks, then increase to 0.5 mg weekly thereafter   No facility-administered encounter medications on file as of 09/20/2022.    ALLERGIES: Allergies  Allergen Reactions   Penicillins Rash    Has patient had a PCN reaction causing immediate rash, facial/tongue/throat swelling, SOB or lightheadedness with hypotension: Yes Has patient had a PCN reaction causing severe rash involving mucus membranes or skin necrosis: No Has patient had a PCN reaction that required hospitalization No Has patient had a PCN reaction occurring within the last 10 years: Yes If all of the above answers are "NO", then may proceed with Cephalosporin use.    Sulfa Antibiotics Rash   VACCINATION STATUS: Immunization History  Administered Date(s) Administered   Fluad Quad(high Dose 65+) 09/29/2020, 08/24/2021, 09/02/2022   Moderna Sars-Covid-2 Vaccination 01/15/2020, 02/12/2020, 11/09/2020   Pneumococcal Conjugate-13 11/04/2020   Pneumococcal Polysaccharide-23 08/06/2019     Barbara Bennett  is a patient with the above  medical history. she was diagnosed  with hypothyroidism at approximate age of 53 years, which required subsequent initiation of thyroid hormone replacement. she was given various doses of Levothyroxine currently on  88 mcg. she reports compliance to this medication:  Taking it daily, on an empty stomach at least 30 minutes prior to eating/drinking or taking other medications, waiting 4 hrs before taking Calcium supplement, MVIs, or acid reflux medications.   I reviewed patient's thyroid tests:  Lab Results  Component Value Date   TSH 1.890 09/02/2022   TSH 0.279 (L) 05/19/2022   TSH 0.230 (L) 02/16/2022   TSH 0.267 (L) 10/14/2021   TSH 0.02 (L) 01/05/2021   TSH 5.58 (H) 11/04/2020   TSH 4.43 08/19/2020   TSH 5.74 06/06/2018   FREET4 1.41 09/02/2022   FREET4 1.54 05/19/2022   FREET4 1.56 02/16/2022   FREET4 1.51 10/14/2021   FREET4 1.2 08/19/2020     Pt denies feeling nodules in neck, hoarseness, dysphagia/odynophagia, SOB with lying down.  she denies family history of thyroid disorders.  No family history of thyroid cancer.  No history of radiation therapy to head or neck.  No recent use of iodine supplements.    I reviewed her chart and she also has a history of DM 2, HTN, HLD, IBS.   Review of systems  Constitutional: + steadily decreasing body weight,  current Body mass index is 35.7 kg/m. , no fatigue, no subjective hyperthermia, no subjective hypothermia Eyes: no blurry vision, no xerophthalmia ENT: no sore throat, no nodules palpated in throat, no dysphagia/odynophagia, no hoarseness Cardiovascular: no chest pain, no shortness of breath, no palpitations, no leg swelling Respiratory: no cough, no shortness of breath Gastrointestinal: no nausea/vomiting/diarrhea, + intermittent constipation Musculoskeletal: no muscle/joint aches Skin: no rashes, no hyperemia Neurological: no tremors, no numbness, no tingling, no dizziness Psychiatric: no depression, no  anxiety   Objective:   Objective     BP 106/71 (BP Location: Left Arm, Patient Position: Sitting, Cuff Size: Large)   Pulse 80   Ht 5' (1.524 m)   Wt 182 lb 12.8 oz (82.9 kg)   LMP 04/01/2012   BMI 35.70 kg/m  Wt Readings from Last 3 Encounters:  09/20/22 182 lb 12.8 oz (82.9 kg)  09/02/22 184 lb (83.5 kg)  06/20/22 191 lb (86.6 kg)    BP Readings from Last 3 Encounters:  09/20/22 106/71  09/02/22 106/69  06/20/22 123/80      Physical Exam- Limited  Constitutional:  Body mass index is 35.7 kg/m. , not in acute distress, normal state of mind Eyes:  EOMI, no exophthalmos Neck: Supple Cardiovascular: RRR, no murmurs, rubs, or gallops, no edema Respiratory: Adequate breathing efforts, no crackles, rales, rhonchi, or wheezing Musculoskeletal: no gross deformities, strength intact in all four extremities, no gross restriction of joint movements Skin:  no rashes, no hyperemia Neurological: no tremor with outstretched hands   CMP ( most recent) CMP     Component Value Date/Time   NA 139 09/02/2022 0930   K 4.8 09/02/2022 0930   CL 101 09/02/2022 0930   CO2 22 09/02/2022 0930   GLUCOSE 103 (H) 09/02/2022 0930   GLUCOSE 115 04/05/2021 1420   BUN 18 09/02/2022 0930   CREATININE 1.26 (H) 09/02/2022 0930   CREATININE 1.03 (H) 04/05/2021 1420   CALCIUM 10.2 09/02/2022 0930   PROT 6.8 09/02/2022 0930   ALBUMIN 4.4 09/02/2022 0930   AST 17 09/02/2022 0930   ALT 12 09/02/2022 0930   ALKPHOS 47 09/02/2022 0930   BILITOT 0.4 09/02/2022 0930   GFRNONAA 51 (L) 01/05/2021 1313   GFRAA 59 (L) 01/05/2021 1313     Diabetic Labs (most recent): Lab Results  Component Value Date   HGBA1C 5.6 09/02/2022   HGBA1C 6.6 (H) 03/03/2022   HGBA1C 6.1 (H) 11/30/2021   MICROALBUR 0.3 08/19/2020   MICROALBUR 0.3 08/15/2017     Lipid Panel ( most recent) Lipid Panel     Component Value Date/Time   CHOL 141 09/02/2022 0930   TRIG 94 09/02/2022 0930   HDL 37 (L) 09/02/2022  0930   CHOLHDL 3.8 09/02/2022 0930   CHOLHDL 3.7 08/19/2020 0818   LDLCALC 86 09/02/2022 0930   LDLCALC 92 08/19/2020 0818   LABVLDL 18 09/02/2022 0930       Lab Results  Component Value Date   TSH 1.890 09/02/2022   TSH 0.279 (L) 05/19/2022   TSH 0.230 (L) 02/16/2022   TSH 0.267 (L) 10/14/2021   TSH 0.02 (L) 01/05/2021   TSH 5.58 (H) 11/04/2020   TSH 4.43 08/19/2020   TSH 5.74 06/06/2018   FREET4 1.41 09/02/2022   FREET4 1.54 05/19/2022   FREET4 1.56 02/16/2022   FREET4 1.51 10/14/2021   FREET4 1.2 08/19/2020     Latest Reference Range & Units 01/05/21 13:13 10/14/21 13:55 02/16/22 11:51 05/19/22 13:56 09/02/22 09:30  TSH 0.450 - 4.500 uIU/mL 0.02 (L) 0.267 (L) 0.230 (L) 0.279 (L) 1.890  Triiodothyronine,Free,Serum 2.0 - 4.4 pg/mL 5.6 (H) 2.4     T4,Free(Direct) 0.82 - 1.77 ng/dL  9.37 1.69 6.78 9.38  Thyroperoxidase Ab SerPl-aCnc 0 - 34 IU/mL  14     Thyroglobulin Antibody 0.0 - 0.9 IU/mL  339.3 (H)     (L): Data is abnormally low (H): Data is abnormally high  Assessment & Plan:   ASSESSMENT / PLAN:  1. Hypothyroidism- r/t Hashimoto's thyroiditis  Her antibody tests showed positive Thyroglobulin antibodies helping to classify her thyroid dysfunction as Hashimoto's thyroiditis.  Her previsit TFTs are consistent with appropriate hormone replacement.  She is advised to continue Levothyroxine 88 mcg po daily before breakfast.  - We discussed about correct intake of levothyroxine, at fasting, with water, separated by at least 30 minutes from breakfast, and separated by more than 4 hours from calcium, iron, multivitamins, acid reflux medications (PPIs). -Patient is made aware of the fact that thyroid hormone replacement is needed for life, dose to be adjusted by periodic monitoring of thyroid function tests.   2. Type 2 Diabetes without complications  Her previsit A1c on 9/29 was 5.6%, improving from last visit of 6.6%.  She denies any hypoglycemia.  She is proud of her  progress, has noticed she does not crave sweets as much now.   - Nutritional counseling repeated at each appointment due to patients tendency to fall back in to old habits.  - The patient admits there is a room for improvement in their diet and drink choices. -  Suggestion is made for the patient to avoid simple carbohydrates from their diet including Cakes, Sweet Desserts / Pastries, Ice Cream, Soda (diet and regular), Sweet Tea, Candies, Chips, Cookies, Sweet Pastries, Store Bought Juices, Alcohol in Excess of 1-2 drinks a day, Artificial Sweeteners, Coffee Creamer, and "Sugar-free" Products. This will help patient to have stable blood glucose profile and potentially avoid unintended weight gain.   - I encouraged the patient to switch to unprocessed or minimally processed complex starch and increased protein intake (animal or plant source), fruits, and vegetables.   - Patient is advised to stick to a routine mealtimes to eat 3 meals a day and avoid unnecessary snacks (to snack only to correct hypoglycemia).  She is advised  to increase her Ozempic to 1 mg SQ weekly since she has tolerated it well and desires to lose more weight.  She does not need to routinely monitor glucose at this time due to safe medication regimen.     I spent 31 minutes in the care of the patient today including review of labs from Thyroid Function, CMP, and other relevant labs ; imaging/biopsy records (current and previous including abstractions from other facilities); face-to-face time discussing  her lab results and symptoms, medications doses, her options of short and long term treatment based on the latest standards of care / guidelines;   and documenting the encounter.  Telford NabBeverly M Majeed  participated in the discussions, expressed understanding, and voiced agreement with the above plans.  All questions were answered to her satisfaction. she is encouraged to contact clinic should she have any questions or concerns  prior to her return visit.   FOLLOW UP PLAN:  Return in about 3 months (around 12/21/2022) for Diabetes F/U with A1c in office, Previsit labs, Bring meter and logs.  Ronny BaconWhitney Stuart Guillen, [facility] County Medical CenterFNP-BC [facility] [address] [place], [place] [address] Phone: [phone] Fax: [phone]  09/20/2022, 2:29 PM

## 2022-10-12 ENCOUNTER — Encounter: Payer: Self-pay | Admitting: Internal Medicine

## 2022-10-12 ENCOUNTER — Ambulatory Visit (INDEPENDENT_AMBULATORY_CARE_PROVIDER_SITE_OTHER): Payer: Medicare Other | Admitting: Internal Medicine

## 2022-10-12 DIAGNOSIS — Z0000 Encounter for general adult medical examination without abnormal findings: Secondary | ICD-10-CM | POA: Diagnosis not present

## 2022-10-12 NOTE — Patient Instructions (Addendum)
  Ms. Reising , Thank you for taking time to come for your Medicare Wellness Visit. I appreciate your ongoing commitment to your health goals. Please review the following plan we discussed and let me know if I can assist you in the future.   These are the goals we discussed: Follow up with Malachi Bonds to order mammogram.  You can receive Tetanus, Shingles and Covid Vaccine at your pharmacy.   Goals      Patient Stated     Goal 155. Has lost 20 lbs working with endocrinologist.         This is a list of the screening recommended for you and due dates:  Health Maintenance  Topic Date Due   Hepatitis C Screening: USPSTF Recommendation to screen - Ages 57-79 yo.  Never done   Tetanus Vaccine  Never done   Zoster (Shingles) Vaccine (1 of 2) Never done   Mammogram  Never done   COVID-19 Vaccine (4 - Moderna risk series) 01/04/2021   Medicare Annual Wellness Visit  10/04/2022   Yearly kidney health urinalysis for diabetes  03/04/2023   Yearly kidney function blood test for diabetes  09/03/2023   Colon Cancer Screening  12/30/2030   Pneumonia Vaccine  Completed   Flu Shot  Completed   DEXA scan (bone density measurement)  Completed   HPV Vaccine  Aged Out   Complete foot exam   Discontinued   Hemoglobin A1C  Discontinued   Eye exam for diabetics  Discontinued

## 2022-10-12 NOTE — Progress Notes (Signed)
Subjective:  This is a telephone encounter between Macario Golds and Milus Banister on 10/12/2022 for AWV. The visit was conducted with the patient located at home and Milus Banister at [facility]. The patient's identity was confirmed using their DOB and current address. The patient has consented to being evaluated through a telephone encounter and understands the associated risks (an examination cannot be done and the patient may need to come in for an appointment) / benefits (allows the patient to remain at home, decreasing exposure to coronavirus).     Barbara Bennett is a 66 y.o. female who presents for Medicare Annual (Subsequent) preventive examination.  Review of Systems    Review of Systems  All other systems reviewed and are negative.     Objective:    There were no vitals filed for this visit. There is no height or weight on file to calculate BMI.     10/12/2022   11:01 AM 10/04/2021    9:48 AM 12/30/2020    8:32 AM 04/28/2016   12:22 PM 03/31/2016    8:29 AM 03/25/2016    1:28 PM 04/04/2012    9:56 AM  Advanced Directives  Does Patient Have a Medical Advance Directive? No No No No No No Patient does not have advance directive  Would patient like information on creating a medical advance directive? No - Patient declined No - Patient declined No - Patient declined No - patient declined information No - patient declined information No - patient declined information     Current Medications (verified) Outpatient Encounter Medications as of 10/12/2022  Medication Sig   atorvastatin (LIPITOR) 40 MG tablet Take 1 tablet (40 mg total) by mouth daily.   Calcium Carb-Cholecalciferol (CALTRATE 600+D3) 600-20 MG-MCG TABS Take 600 mg by mouth 2 (two) times daily.   Cinnamon 500 MG capsule Take 1,000 mg by mouth daily.   diclofenac Sodium (VOLTAREN) 1 % GEL Apply 4 g topically 4 (four) times daily.   irbesartan (AVAPRO) 150 MG tablet TAKE 1 TABLET(150 MG) BY MOUTH DAILY   levothyroxine  (SYNTHROID) 88 MCG tablet Take 1 tablet (88 mcg total) by mouth daily before breakfast.   melatonin 1 MG TABS tablet Take 1 mg by mouth at bedtime. As needed   Semaglutide, 1 MG/DOSE, 4 MG/3ML SOPN Inject 1 mg as directed once a week.   No facility-administered encounter medications on file as of 10/12/2022.    Allergies (verified) Penicillins and Sulfa antibiotics   History: Past Medical History:  Diagnosis Date   Anemia    Anemia, unspecified    Arthritis    knees   Bell's palsy    Hyperlipidemia, unspecified    Hypothyroidism, unspecified    Obesity, unspecified    Type 2 diabetes mellitus without complications (HCC)    no longer on medications, previously on metformin for borderline DM, A1C 5.6   Past Surgical History:  Procedure Laterality Date   CATARACT EXTRACTION W/PHACO Left 03/31/2016   Procedure: CATARACT EXTRACTION PHACO AND INTRAOCULAR LENS PLACEMENT (IOC);  Surgeon: Gemma Payor, MD;  Location: [facility];  Service: Ophthalmology;  Laterality: Left;  CDE: 6.45   CATARACT EXTRACTION W/PHACO Right 04/28/2016   Procedure: CATARACT EXTRACTION PHACO AND INTRAOCULAR LENS PLACEMENT (IOC);  Surgeon: Gemma Payor, MD;  Location: [facility];  Service: Ophthalmology;  Laterality: Right;  CDE 5.00   COLONOSCOPY N/A 12/30/2020   Procedure: COLONOSCOPY;  Surgeon: Corbin Ade, MD;  Location: [facility];  Service: Endoscopy;  Laterality: N/A;  9:30am   KNEE ARTHROSCOPY Left    Family History  Problem Relation Age of Onset   Diabetes Mother    Heart disease Mother    Heart disease Father    Diabetes Sister    Renal Disease Sister        Dialysis   Asthma Sister    Hypertension Sister    Diabetes Sister    Arthritis Sister    Diabetes Sister    Renal Disease Sister        Dialysis   Hypertension Sister    Migraines Daughter    Pneumonia Son    Colon cancer Neg Hx    Sickle cell anemia Neg Hx    Thalassemia Neg Hx    Leukemia Neg Hx    Social History   Socioeconomic  History   Marital status: Married    Spouse name: Not on file   Number of children: 2   Years of education: 12   Highest education level: Not on file  Occupational History   Occupation: Retired- worked at [employer]  Tobacco Use   Smoking status: Never   Smokeless tobacco: Never  Vaping Use   Vaping Use: Never used  Substance and Sexual Activity   Alcohol use: No   Drug use: No   Sexual activity: Not Currently  Other Topics Concern   Not on file  Social History Narrative   Married for 74 years,second.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      Son deceased   Daughter lives nearby   Social Determinants of Health   Financial Resource Strain: [place]  (10/04/2021)   Overall Financial Resource Strain (CARDIA)    Difficulty of Paying Living Expenses: Not hard at all  Food Insecurity: No Food Insecurity (10/04/2021)   Hunger Vital Sign    Worried About Running Out of Food in the Last Year: Never true    Ran Out of Food in the Last Year: Never true  Transportation Needs: No Transportation Needs (10/04/2021)   PRAPARE - Hydrologist (Medical): No    Lack of Transportation (Non-Medical): No  Physical Activity: Sufficiently Active (10/04/2021)   Exercise Vital Sign    Days of Exercise per Week: 5 days    Minutes of Exercise per Session: 30 min  Stress: No Stress Concern Present (10/04/2021)   [place]    Feeling of Stress : Not at all  Social Connections: Moderately Integrated (10/04/2021)   Social Connection and Isolation Panel [NHANES]    Frequency of Communication with Friends and Family: More than three times a week    Frequency of Social Gatherings with Friends and Family: More than three times a week    Attends Religious Services: More than 4 times per year    Active Member of [employer] or Organizations: No    Attends Archivist Meetings: Never     Marital Status: Married    Tobacco Counseling Counseling given: Not Answered   Clinical Intake:  Pre-visit preparation completed: Yes  Pain : No/denies pain        How often do you need to have someone help you when you read instructions, pamphlets, or other written materials from your doctor or pharmacy?: 1 - Never What is the last grade level you completed in school?: 12th grade    Activities of Daily Living    10/12/2022   11:03 AM 10/11/2022    5:48 PM  In your present state of health, do you have any difficulty performing the following activities:  Hearing? 0 0  Vision? 0 0  Difficulty concentrating or making decisions? 0 0  Walking or climbing stairs? 0 0  Dressing or bathing? 0 0  Doing errands, shopping? 0 0  Preparing Food and eating ?  N  Using the Toilet?  N  In the past six months, have you accidently leaked urine?  N  Do you have problems with loss of bowel control?  N  Managing your Medications?  N  Managing your Finances?  N  Housekeeping or managing your Housekeeping?  N    Patient Care Team: Alvira Monday, FNP as PCP - General (Family Medicine) Gala Romney Cristopher Estimable, MD as Consulting Physician (Gastroenterology)  Indicate any recent Medical Services you may have received from other than [facility] providers in the past year (date may be approximate).     Assessment:   This is a routine wellness examination for [place].  Hearing/Vision screen No results found.  Dietary issues and exercise activities discussed:     Goals Addressed   None    Depression Screen    10/12/2022   11:03 AM 09/02/2022    9:05 AM 06/09/2022    2:08 PM 06/03/2022    9:58 AM 04/22/2022    9:49 AM 03/09/2022   10:25 AM 12/01/2021   10:06 AM  PHQ 2/9 Scores  PHQ - 2 Score 0 0 0 0 0 0 0    Fall Risk    10/12/2022   11:03 AM 10/11/2022    5:48 PM 09/02/2022    9:05 AM 06/09/2022    2:08 PM 06/03/2022    9:58 AM  Fall Risk   Falls in the past year? 0 0 0 0 0  Number falls in  past yr: 0  0 0 0  Injury with Fall? 0  0 0 0  Risk for fall due to :   No Fall Risks No Fall Risks   Follow up   Falls evaluation completed Falls evaluation completed     FALL RISK PREVENTION PERTAINING TO THE HOME:  Any stairs in or around the home? Yes  If so, are there any without handrails? Yes  Home free of loose throw rugs in walkways, pet beds, electrical cords, etc? Yes  Adequate lighting in your home to reduce risk of falls? Yes   ASSISTIVE DEVICES UTILIZED TO PREVENT FALLS:  Life alert? Yes  Use of a cane, walker or w/c? No  Grab bars in the bathroom? No  Shower chair or bench in shower? No  Elevated toilet seat or a handicapped toilet? No    Cognitive Function:    10/04/2021    9:49 AM  MMSE - Mini Mental State Exam  Not completed: Unable to complete        10/12/2022   11:03 AM 10/04/2021    9:49 AM  6CIT Screen  What Year? 0 points 0 points  What month? 0 points 0 points  What time? 0 points 0 points  Count back from 20 0 points 0 points  Months in reverse 0 points   Repeat phrase 0 points 10 points  Total Score 0 points     Immunizations Immunization History  Administered Date(s) Administered   Fluad Quad(high Dose 65+) 09/29/2020, 08/24/2021, 09/02/2022   Moderna Sars-Covid-2 Vaccination 01/15/2020, 02/12/2020, 11/09/2020   Pneumococcal Conjugate-13 11/04/2020   Pneumococcal Polysaccharide-23 08/06/2019    TDAP status: Due, Education has been  provided regarding the importance of this vaccine. Advised may receive this vaccine at local pharmacy or Health Dept. Aware to provide a copy of the vaccination record if obtained from local pharmacy or Health Dept. Verbalized acceptance and understanding.  Flu Vaccine status: Up to date  Pneumococcal vaccine status: Up to date  Covid-19 vaccine status: Information provided on how to obtain vaccines.   Qualifies for Shingles Vaccine? Yes   Zostavax completed No   Shingrix Completed?: No.    Education  has been provided regarding the importance of this vaccine. Patient has been advised to call insurance company to determine out of pocket expense if they have not yet received this vaccine. Advised may also receive vaccine at local pharmacy or Health Dept. Verbalized acceptance and understanding.  Screening Tests Health Maintenance  Topic Date Due   Hepatitis C Screening  Never done   TETANUS/TDAP  Never done   Zoster Vaccines- Shingrix (1 of 2) Never done   MAMMOGRAM  Never done   COVID-19 Vaccine (4 - Moderna risk series) 01/04/2021   Medicare Annual Wellness (AWV)  10/04/2022   Diabetic kidney evaluation - Urine ACR  03/04/2023   Diabetic kidney evaluation - GFR measurement  09/03/2023   COLONOSCOPY (Pts 45-28yrs Insurance coverage will need to be confirmed)  12/30/2030   Pneumonia Vaccine 28+ Years old  Completed   INFLUENZA VACCINE  Completed   DEXA SCAN  Completed   HPV VACCINES  Aged Out   FOOT EXAM  Discontinued   HEMOGLOBIN A1C  Discontinued   [place] Maintenance  Health Maintenance Due  Topic Date Due   Hepatitis C Screening  Never done   TETANUS/TDAP  Never done   Zoster Vaccines- Shingrix (1 of 2) Never done   MAMMOGRAM  Never done   COVID-19 Vaccine (4 - Moderna risk series) 01/04/2021   Medicare Annual Wellness (AWV)  10/04/2022    Colorectal cancer screening: Type of screening: Colonoscopy. Completed 12/30/2020. Repeat every 10 years  Mammogram status: Due, patient deferred until following up with PCP  Bone Density status: Completed 06/13/2022. Results reflect: Bone density results: OSTEOPENIA.   Lung Cancer Screening: (Low Dose CT Chest recommended if Age 56-80 years, 30 pack-year currently smoking OR have quit w/in 15years.) does not qualify.    Additional Screening:  Hepatitis C Screening: does qualify; Will complete next time she has blood work  Vision Screening: Recommended annual ophthalmology exams for early  detection of glaucoma and other disorders of the eye. Is the patient up to date with their annual eye exam?  Yes  Who is the provider or what is the name of the office in which the patient attends annual eye exams? [facility] [place] If pt is not established with a provider, would they like to be referred to a provider to establish care? No .   Dental Screening: Recommended annual dental exams for proper oral hygiene  Community Resource Referral / Chronic Care Management: CRR required this visit?  No   CCM required this visit?  No      Plan:     I have personally reviewed and noted the following in the patient's chart:   Medical and social history Use of alcohol, tobacco or illicit drugs  Current medications and supplements including opioid prescriptions. Patient is not currently taking opioid prescriptions. Functional ability and status Nutritional status Physical activity Advanced directives List of other physicians Hospitalizations, surgeries, and ER visits in previous 12 months Vitals Screenings  to include cognitive, depression, and falls Referrals and appointments  In addition, I have reviewed and discussed with patient certain preventive protocols, quality metrics, and best practice recommendations. A written personalized care plan for preventive services as well as general preventive health recommendations were provided to patient.     Milus Banister, MD   10/12/2022

## 2022-11-07 ENCOUNTER — Other Ambulatory Visit: Payer: Self-pay | Admitting: Nurse Practitioner

## 2022-11-07 DIAGNOSIS — I10 Essential (primary) hypertension: Secondary | ICD-10-CM

## 2022-12-20 NOTE — Patient Instructions (Signed)

## 2022-12-21 ENCOUNTER — Encounter: Payer: Self-pay | Admitting: Nurse Practitioner

## 2022-12-21 ENCOUNTER — Ambulatory Visit (INDEPENDENT_AMBULATORY_CARE_PROVIDER_SITE_OTHER): Payer: Medicare Other | Admitting: Nurse Practitioner

## 2022-12-21 VITALS — BP 110/76 | HR 99 | Ht 60.0 in | Wt 170.0 lb

## 2022-12-21 DIAGNOSIS — E119 Type 2 diabetes mellitus without complications: Secondary | ICD-10-CM | POA: Diagnosis not present

## 2022-12-21 DIAGNOSIS — E039 Hypothyroidism, unspecified: Secondary | ICD-10-CM | POA: Diagnosis not present

## 2022-12-21 DIAGNOSIS — E038 Other specified hypothyroidism: Secondary | ICD-10-CM | POA: Diagnosis not present

## 2022-12-21 DIAGNOSIS — E063 Autoimmune thyroiditis: Secondary | ICD-10-CM

## 2022-12-21 LAB — COMPREHENSIVE METABOLIC PANEL
ALT: 22 IU/L (ref 0–32)
AST: 28 IU/L (ref 0–40)
Albumin/Globulin Ratio: 1.6 (ref 1.2–2.2)
Albumin: 4.2 g/dL (ref 3.9–4.9)
Alkaline Phosphatase: 59 IU/L (ref 44–121)
BUN/Creatinine Ratio: 11 — ABNORMAL LOW (ref 12–28)
BUN: 14 mg/dL (ref 8–27)
Bilirubin Total: 0.4 mg/dL (ref 0.0–1.2)
CO2: 22 mmol/L (ref 20–29)
Calcium: 9.6 mg/dL (ref 8.7–10.3)
Chloride: 102 mmol/L (ref 96–106)
Creatinine, Ser: 1.29 mg/dL — ABNORMAL HIGH (ref 0.57–1.00)
Globulin, Total: 2.7 g/dL (ref 1.5–4.5)
Glucose: 106 mg/dL — ABNORMAL HIGH (ref 70–99)
Potassium: 4.1 mmol/L (ref 3.5–5.2)
Sodium: 140 mmol/L (ref 134–144)
Total Protein: 6.9 g/dL (ref 6.0–8.5)
eGFR: 45 mL/min/{1.73_m2} — ABNORMAL LOW (ref >59)

## 2022-12-21 LAB — POCT GLYCOSYLATED HEMOGLOBIN (HGB A1C): Hemoglobin A1C: 5.2 % (ref 4.0–5.6)

## 2022-12-21 LAB — T4, FREE: Free T4: 1.53 ng/dL (ref 0.82–1.77)

## 2022-12-21 LAB — TSH: TSH: 0.89 u[IU]/mL (ref 0.450–4.500)

## 2022-12-21 MED ORDER — LEVOTHYROXINE SODIUM 88 MCG PO TABS
88.0000 ug | ORAL_TABLET | Freq: Every day | ORAL | 3 refills | Status: DC
Start: 2022-12-21 — End: 2023-12-27

## 2022-12-21 MED ORDER — SEMAGLUTIDE (1 MG/DOSE) 4 MG/3ML ~~LOC~~ SOPN
1.0000 mg | PEN_INJECTOR | SUBCUTANEOUS | 3 refills | Status: DC
Start: 2022-12-21 — End: 2023-01-04

## 2022-12-21 NOTE — Progress Notes (Signed)
Endocrinology Follow Up Note                                         12/21/2022, 1:45 PM  Subjective:   Subjective    Barbara Bennett is a 67 y.o.-year-old female patient being seen in follow up after being seen in consultation for hypothyroidism referred by Alvira Monday, [place].  She was a patient of Dr. Anastasio Champion.   Past Medical History:  Diagnosis Date   Anemia    Anemia, unspecified    Arthritis    knees   Bell's palsy    Hyperlipidemia, unspecified    Hypothyroidism, unspecified    Obesity, unspecified    Type 2 diabetes mellitus without complications (HCC)    no longer on medications, previously on metformin for borderline DM, A1C 5.6    Past Surgical History:  Procedure Laterality Date   CATARACT EXTRACTION W/PHACO Left 03/31/2016   Procedure: CATARACT EXTRACTION PHACO AND INTRAOCULAR LENS PLACEMENT ([place]);  Surgeon: Tonny Branch, MD;  Location: [facility];  Service: Ophthalmology;  Laterality: Left;  CDE: 6.45   CATARACT EXTRACTION W/PHACO Right 04/28/2016   Procedure: CATARACT EXTRACTION PHACO AND INTRAOCULAR LENS PLACEMENT ([facility]);  Surgeon: Tonny Branch, MD;  Location: [facility];  Service: Ophthalmology;  Laterality: Right;  CDE 5.00   COLONOSCOPY N/A 12/30/2020   Procedure: COLONOSCOPY;  Surgeon: Daneil Dolin, MD;  Location: [facility];  Service: Endoscopy;  Laterality: N/A;  9:30am   KNEE ARTHROSCOPY Left     Social History   Socioeconomic History   Marital status: Married    Spouse name: Not on file   Number of children: 2   Years of education: 12   Highest education level: Not on file  Occupational History   Occupation: Retired- worked at [employer] Use   Smoking status: Never   Smokeless tobacco: Never  Vaping Use   Vaping Use: Never used  Substance and Sexual Activity   Alcohol use: No   Drug use: No   Sexual activity: Not Currently  Other Topics Concern   Not on file  Social  History Narrative   Married for 46 years,second.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      Son deceased   Daughter lives nearby   Social Determinants of Health   Financial Resource Strain: [place]  (10/04/2021)   Overall Financial Resource Strain (CARDIA)    Difficulty of Paying Living Expenses: Not hard at all  Food Insecurity: No Food Insecurity (10/04/2021)   Hunger Vital Sign    Worried About Running Out of Food in the Last Year: Never true    Ran Out of Food in the Last Year: Never true  Transportation Needs: No Transportation Needs (10/04/2021)   PRAPARE - Hydrologist (Medical): No    Lack of Transportation (Non-Medical): No  Physical Activity: Sufficiently Active (10/04/2021)   Exercise Vital Sign    Days of Exercise per Week: 5 days    Minutes of  Exercise per Session: 30 min  Stress: No Stress Concern Present (10/04/2021)   [place]    Feeling of Stress : Not at all  Social Connections: Moderately Integrated (10/04/2021)   Social Connection and Isolation Panel [NHANES]    Frequency of Communication with Friends and Family: More than three times a week    Frequency of Social Gatherings with Friends and Family: More than three times a week    Attends Religious Services: More than 4 times per year    Active Member of [employer] or Organizations: No    Attends Archivist Meetings: Never    Marital Status: Married    Family History  Problem Relation Age of Onset   Diabetes Mother    Heart disease Mother    Heart disease Father    Diabetes Sister    Renal Disease Sister        Dialysis   Asthma Sister    Hypertension Sister    Diabetes Sister    Arthritis Sister    Diabetes Sister    Renal Disease Sister        Dialysis   Hypertension Sister    Migraines Daughter    Pneumonia Son    Colon cancer Neg Hx    Sickle cell anemia Neg Hx     Thalassemia Neg Hx    Leukemia Neg Hx     Outpatient Encounter Medications as of 12/21/2022  Medication Sig   atorvastatin (LIPITOR) 40 MG tablet Take 1 tablet (40 mg total) by mouth daily.   Calcium Carb-Cholecalciferol (CALTRATE 600+D3) 600-20 MG-MCG TABS Take 600 mg by mouth 2 (two) times daily.   diclofenac Sodium (VOLTAREN) 1 % GEL Apply 4 g topically 4 (four) times daily.   irbesartan (AVAPRO) 150 MG tablet TAKE 1 TABLET(150 MG) BY MOUTH DAILY   [DISCONTINUED] levothyroxine (SYNTHROID) 88 MCG tablet Take 1 tablet (88 mcg total) by mouth daily before breakfast.   [DISCONTINUED] Semaglutide, 1 MG/DOSE, 4 MG/3ML SOPN Inject 1 mg as directed once a week.   levothyroxine (SYNTHROID) 88 MCG tablet Take 1 tablet (88 mcg total) by mouth daily before breakfast.   Semaglutide, 1 MG/DOSE, 4 MG/3ML SOPN Inject 1 mg as directed once a week.   [DISCONTINUED] Cinnamon 500 MG capsule Take 1,000 mg by mouth daily.   [DISCONTINUED] melatonin 1 MG TABS tablet Take 1 mg by mouth at bedtime. As needed   No facility-administered encounter medications on file as of 12/21/2022.    ALLERGIES: Allergies  Allergen Reactions   Penicillins Rash    Has patient had a PCN reaction causing immediate rash, facial/tongue/throat swelling, SOB or lightheadedness with hypotension: Yes Has patient had a PCN reaction causing severe rash involving mucus membranes or skin necrosis: No Has patient had a PCN reaction that required hospitalization No Has patient had a PCN reaction occurring within the last 10 years: Yes If all of the above answers are "NO", then may proceed with Cephalosporin use.    Sulfa Antibiotics Rash   VACCINATION STATUS: Immunization History  Administered Date(s) Administered   Fluad Quad(high Dose 65+) 09/29/2020, 08/24/2021, 09/02/2022   Moderna Sars-Covid-2 Vaccination 01/15/2020, 02/12/2020, 11/09/2020   Pneumococcal Conjugate-13 11/04/2020   Pneumococcal Polysaccharide-23 08/06/2019      Barbara Bennett  is a patient with the above medical history. she was diagnosed  with hypothyroidism at approximate age of 69 years, which required subsequent initiation of thyroid hormone replacement. she was given various  doses of Levothyroxine currently on 88 mcg. she reports compliance to this medication:  Taking it daily, on an empty stomach at least 30 minutes prior to eating/drinking or taking other medications, waiting 4 hrs before taking Calcium supplement, MVIs, or acid reflux medications.   I reviewed patient's thyroid tests:  Bennett Results  Component Value Date   TSH 0.890 12/20/2022   TSH 1.890 09/02/2022   TSH 0.279 (L) 05/19/2022   TSH 0.230 (L) 02/16/2022   TSH 0.267 (L) 10/14/2021   TSH 0.02 (L) 01/05/2021   TSH 5.58 (H) 11/04/2020   TSH 4.43 08/19/2020   TSH 5.74 06/06/2018   FREET4 1.53 12/20/2022   FREET4 1.41 09/02/2022   FREET4 1.54 05/19/2022   FREET4 1.56 02/16/2022   FREET4 1.51 10/14/2021   FREET4 1.2 08/19/2020     Pt denies feeling nodules in neck, hoarseness, dysphagia/odynophagia, SOB with lying down.  she denies family history of thyroid disorders.  No family history of thyroid cancer.  No history of radiation therapy to head or neck.  No recent use of iodine supplements.    I reviewed her chart and she also has a history of DM 2, HTN, HLD, IBS.   Review of systems  Constitutional: + steadily decreasing body weight,  current Body mass index is 33.2 kg/m. , no fatigue, no subjective hyperthermia, no subjective hypothermia Eyes: no blurry vision, no xerophthalmia ENT: no sore throat, no nodules palpated in throat, no dysphagia/odynophagia, no hoarseness Cardiovascular: no chest pain, no shortness of breath, no palpitations, no leg swelling Respiratory: no cough, no shortness of breath Gastrointestinal: no nausea/vomiting/diarrhea, + intermittent constipation Musculoskeletal: no muscle/joint aches Skin: no rashes, no  hyperemia Neurological: no tremors, no numbness, no tingling, no dizziness Psychiatric: no depression, no anxiety   Objective:   Objective     BP 110/76 (BP Location: Left Arm, Patient Position: Sitting, Cuff Size: Normal)   Pulse 99   Ht 5' (1.524 m)   Wt 170 lb (77.1 kg)   LMP 04/01/2012   BMI 33.20 kg/m  Wt Readings from Last 3 Encounters:  12/21/22 170 lb (77.1 kg)  09/20/22 182 lb 12.8 oz (82.9 kg)  09/02/22 184 lb (83.5 kg)    BP Readings from Last 3 Encounters:  12/21/22 110/76  09/20/22 106/71  09/02/22 106/69      Physical Exam- Limited  Constitutional:  Body mass index is 33.2 kg/m. , not in acute distress, normal state of mind Eyes:  EOMI, no exophthalmos Musculoskeletal: no gross deformities, strength intact in all four extremities, no gross restriction of joint movements Skin:  no rashes, no hyperemia Neurological: no tremor with outstretched hands   CMP ( most recent) CMP     Component Value Date/Time   NA 140 12/20/2022 1030   K 4.1 12/20/2022 1030   CL 102 12/20/2022 1030   CO2 22 12/20/2022 1030   GLUCOSE 106 (H) 12/20/2022 1030   GLUCOSE 115 04/05/2021 1420   BUN 14 12/20/2022 1030   CREATININE 1.29 (H) 12/20/2022 1030   CREATININE 1.03 (H) 04/05/2021 1420   CALCIUM 9.6 12/20/2022 1030   PROT 6.9 12/20/2022 1030   ALBUMIN 4.2 12/20/2022 1030   AST 28 12/20/2022 1030   ALT 22 12/20/2022 1030   ALKPHOS 59 12/20/2022 1030   BILITOT 0.4 12/20/2022 1030   GFRNONAA 51 (L) 01/05/2021 1313   GFRAA 59 (L) 01/05/2021 1313     Diabetic Labs (most recent): Bennett Results  Component Value Date   HGBA1C 5.2  12/21/2022   HGBA1C 5.6 09/02/2022   HGBA1C 6.6 (H) 03/03/2022   MICROALBUR 0.3 08/19/2020   MICROALBUR 0.3 08/15/2017     Lipid Panel ( most recent) Lipid Panel     Component Value Date/Time   CHOL 141 09/02/2022 0930   TRIG 94 09/02/2022 0930   HDL 37 (L) 09/02/2022 0930   CHOLHDL 3.8 09/02/2022 0930   CHOLHDL 3.7 08/19/2020  0818   LDLCALC 86 09/02/2022 0930   LDLCALC 92 08/19/2020 0818   LABVLDL 18 09/02/2022 0930       Bennett Results  Component Value Date   TSH 0.890 12/20/2022   TSH 1.890 09/02/2022   TSH 0.279 (L) 05/19/2022   TSH 0.230 (L) 02/16/2022   TSH 0.267 (L) 10/14/2021   TSH 0.02 (L) 01/05/2021   TSH 5.58 (H) 11/04/2020   TSH 4.43 08/19/2020   TSH 5.74 06/06/2018   FREET4 1.53 12/20/2022   FREET4 1.41 09/02/2022   FREET4 1.54 05/19/2022   FREET4 1.56 02/16/2022   FREET4 1.51 10/14/2021   FREET4 1.2 08/19/2020     Latest Reference Range & Units 02/16/22 11:51 05/19/22 13:56 09/02/22 09:30 12/20/22 10:30  TSH 0.450 - 4.500 uIU/mL 0.230 (L) 0.279 (L) 1.890 0.890  T4,Free(Direct) 0.82 - 1.77 ng/dL 1.56 1.54 1.41 1.53  (L): Data is abnormally low  Assessment & Plan:   ASSESSMENT / PLAN:  1. Hypothyroidism- r/t Hashimoto's thyroiditis  Her antibody tests showed positive Thyroglobulin antibodies helping to classify her thyroid dysfunction as Hashimoto's thyroiditis.  Her previsit TFTs are consistent with appropriate hormone replacement.  She is advised to continue Levothyroxine 88 mcg po daily before breakfast.  - We discussed about correct intake of levothyroxine, at fasting, with water, separated by at least 30 minutes from breakfast, and separated by more than 4 hours from calcium, iron, multivitamins, acid reflux medications (PPIs). -Patient is made aware of the fact that thyroid hormone replacement is needed for life, dose to be adjusted by periodic monitoring of thyroid function tests.   2. Type 2 Diabetes without complications  Her POCT Z6X today is 5.2%, improving from last visit of 5.6%.  She has tolerated the Ozempic well, has done an excellent job managing her diabetes without any unpleasant side effects.  - Nutritional counseling repeated at each appointment due to patients tendency to fall back in to old habits.  - The patient admits there is a room for improvement in  their diet and drink choices. -  Suggestion is made for the patient to avoid simple carbohydrates from their diet including Cakes, Sweet Desserts / Pastries, Ice Cream, Soda (diet and regular), Sweet Tea, Candies, Chips, Cookies, Sweet Pastries, Store Bought Juices, Alcohol in Excess of 1-2 drinks a day, Artificial Sweeteners, Coffee Creamer, and "Sugar-free" Products. This will help patient to have stable blood glucose profile and potentially avoid unintended weight gain.   - I encouraged the patient to switch to unprocessed or minimally processed complex starch and increased protein intake (animal or plant source), fruits, and vegetables.   - Patient is advised to stick to a routine mealtimes to eat 3 meals a day and avoid unnecessary snacks (to snack only to correct hypoglycemia).  She is advised to continue her Ozempic 1 mg SQ weekly.  She does not need to routinely monitor glucose at this time due to safe medication regimen.     I spent 30 minutes in the care of the patient today including review of labs from CMP, Lipids, Thyroid Function, Hematology (current  and previous including abstractions from other facilities); face-to-face time discussing  her blood glucose readings/logs, discussing hypoglycemia and hyperglycemia episodes and symptoms, medications doses, her options of short and long term treatment based on the latest standards of care / guidelines;  discussion about incorporating lifestyle medicine;  and documenting the encounter. Risk reduction counseling performed per USPSTF guidelines to reduce obesity and cardiovascular risk factors.     Please refer to Patient Instructions for Blood Glucose Monitoring and Insulin/Medications Dosing Guide"  in media tab for additional information. Please  also refer to " Patient Self Inventory" in the Media  tab for reviewed elements of pertinent patient history.  Charmaine Downs Godina participated in the discussions, expressed understanding, and  voiced agreement with the above plans.  All questions were answered to her satisfaction. she is encouraged to contact clinic should she have any questions or concerns prior to her return visit.   FOLLOW UP PLAN:  Return in about 6 months (around 06/21/2023) for Diabetes F/U with A1c in office, Thyroid follow up, Previsit labs.  Rayetta Pigg, [facility] [address] [place], [place] [address] Phone: [phone] Fax: [phone]  12/21/2022, 1:45 PM

## 2023-01-04 ENCOUNTER — Ambulatory Visit: Payer: Medicare Other | Admitting: Family Medicine

## 2023-01-04 ENCOUNTER — Other Ambulatory Visit: Payer: Self-pay | Admitting: Nurse Practitioner

## 2023-01-04 ENCOUNTER — Telehealth: Payer: Self-pay | Admitting: Nurse Practitioner

## 2023-01-04 MED ORDER — SEMAGLUTIDE (1 MG/DOSE) 4 MG/3ML ~~LOC~~ SOPN: 1.0000 mg | PEN_INJECTOR | SUBCUTANEOUS | 3 refills | Status: DC

## 2023-01-04 NOTE — Telephone Encounter (Signed)
Pt made aware

## 2023-01-06 ENCOUNTER — Ambulatory Visit (INDEPENDENT_AMBULATORY_CARE_PROVIDER_SITE_OTHER): Payer: Medicare Other | Admitting: Family Medicine

## 2023-01-06 ENCOUNTER — Encounter: Payer: Self-pay | Admitting: Family Medicine

## 2023-01-06 VITALS — BP 112/75 | HR 84 | Ht 60.0 in | Wt 168.0 lb

## 2023-01-06 DIAGNOSIS — E119 Type 2 diabetes mellitus without complications: Secondary | ICD-10-CM | POA: Diagnosis not present

## 2023-01-06 DIAGNOSIS — E782 Mixed hyperlipidemia: Secondary | ICD-10-CM

## 2023-01-06 DIAGNOSIS — G479 Sleep disorder, unspecified: Secondary | ICD-10-CM

## 2023-01-06 DIAGNOSIS — I10 Essential (primary) hypertension: Secondary | ICD-10-CM

## 2023-01-06 DIAGNOSIS — E038 Other specified hypothyroidism: Secondary | ICD-10-CM

## 2023-01-06 DIAGNOSIS — E559 Vitamin D deficiency, unspecified: Secondary | ICD-10-CM

## 2023-01-06 MED ORDER — HYDROXYZINE PAMOATE 25 MG PO CAPS: 25.0000 mg | ORAL_CAPSULE | Freq: Three times a day (TID) | ORAL | 0 refills | Status: DC | PRN

## 2023-01-06 NOTE — Patient Instructions (Addendum)
I appreciate the opportunity to provide care to you today!    Follow up:  3 months  Labs: please stop by the lab today to get your blood drawn (CBC, TSH, Lipid profile, Vit D)    Your medication has been sent to your pharmacy     Please continue to a heart-healthy diet and increase your physical activities. Try to exercise for 31mins at least five times a week.   Physical activity helps: Lower your blood glucose, improve your heart health, lower your blood pressure and cholesterol, burn calories to help manage her weight, gave you energy, lower stress, and improve his sleep.  The American diabetes Association (ADA) recommends being active for 2-1/2 hours (150 minutes) or more week.  Exercise for 30 minutes, 5 days a week (150 minutes total)      It was a pleasure to see you and I look forward to continuing to work together on your health and well-being. Please do not hesitate to call the office if you need care or have questions about your care.   Have a wonderful day and week. With Gratitude, Alvira Monday MSN, FNP-BC

## 2023-01-06 NOTE — Progress Notes (Unsigned)
Established Patient Office Visit  Subjective:  Patient ID: Barbara Bennett, female    DOB: 03-06-1956  Age: 67 y.o. MRN: [id number]  CC:  Chief Complaint  Patient presents with   Follow-up    4 month f/u. No complaints.     HPI Barbara Bennett is a 67 y.o. female with past medical history of hypertension, type 2 diabetes, hypothyroidism, and hyperlipidemia presents for f/u of  chronic medical conditions. For the details of today's visit, please refer to the assessment and plan.     Past Medical History:  Diagnosis Date   Anemia    Anemia, unspecified    Arthritis    knees   Bell's palsy    Hyperlipidemia, unspecified    Hypothyroidism, unspecified    Obesity, unspecified    Type 2 diabetes mellitus without complications (HCC)    no longer on medications, previously on metformin for borderline DM, A1C 5.6    Past Surgical History:  Procedure Laterality Date   CATARACT EXTRACTION W/PHACO Left 03/31/2016   Procedure: CATARACT EXTRACTION PHACO AND INTRAOCULAR LENS PLACEMENT ([place]);  Surgeon: Tonny Branch, MD;  Location: [facility];  Service: Ophthalmology;  Laterality: Left;  CDE: 6.45   CATARACT EXTRACTION W/PHACO Right 04/28/2016   Procedure: CATARACT EXTRACTION PHACO AND INTRAOCULAR LENS PLACEMENT ([facility]);  Surgeon: Tonny Branch, MD;  Location: [facility];  Service: Ophthalmology;  Laterality: Right;  CDE 5.00   COLONOSCOPY N/A 12/30/2020   Procedure: COLONOSCOPY;  Surgeon: Daneil Dolin, MD;  [facility];  Service: Endoscopy;  Laterality: N/A;  9:30am   KNEE ARTHROSCOPY Left     Family History  Problem Relation Age of Onset   Diabetes Mother    Heart disease Mother    Heart disease Father    Diabetes Sister    Renal Disease Sister        Dialysis   Asthma Sister    Hypertension Sister    Diabetes Sister    Arthritis Sister    Diabetes Sister    Renal Disease Sister        Dialysis   Hypertension Sister    Migraines Daughter    Pneumonia Son    Colon cancer  Neg Hx    Sickle cell anemia Neg Hx    Thalassemia Neg Hx    Leukemia Neg Hx     Social History   Socioeconomic History   Marital status: Married    Spouse name: Not on file   Number of children: 2   Years of education: 12   Highest education level: Not on file  Occupational History   Occupation: Retired- worked at [employer]  Tobacco Use   Smoking status: Never   Smokeless tobacco: Never  Vaping Use   Vaping Use: Never used  Substance and Sexual Activity   Alcohol use: No   Drug use: No   Sexual activity: Not Currently  Other Topics Concern   Not on file  Social History Narrative   Married for 42 years,second.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      Son deceased   Daughter lives nearby   Social Determinants of Health   Financial Resource Strain: [place]  (10/04/2021)   Overall Financial Resource Strain (CARDIA)    Difficulty of Paying Living Expenses: Not hard at all  Food Insecurity: No Food Insecurity (10/04/2021)   Hunger Vital Sign    Worried About Running Out of Food in the Last Year: Never true    Ran  Out of Food in the Last Year: Never true  Transportation Needs: No Transportation Needs (10/04/2021)   PRAPARE - Hydrologist (Medical): No    Lack of Transportation (Non-Medical): No  Physical Activity: Sufficiently Active (10/04/2021)   Exercise Vital Sign    Days of Exercise per Week: 5 days    Minutes of Exercise per Session: 30 min  Stress: No Stress Concern Present (10/04/2021)   [place]    Feeling of Stress : Not at all  Social Connections: Moderately Integrated (10/04/2021)   Social Connection and Isolation Panel [NHANES]    Frequency of Communication with Friends and Family: More than three times a week    Frequency of Social Gatherings with Friends and Family: More than three times a week    Attends Religious Services: More than 4  times per year    Active Member of [employer] or Organizations: No    Attends Archivist Meetings: Never    Marital Status: Married  Human resources officer Violence: Not At Risk (10/04/2021)   Humiliation, Afraid, Rape, and Kick questionnaire    Fear of Current or Ex-Partner: No    Emotionally Abused: No    Physically Abused: No    Sexually Abused: No    Outpatient Medications Prior to Visit  Medication Sig Dispense Refill   atorvastatin (LIPITOR) 40 MG tablet Take 1 tablet (40 mg total) by mouth daily. 90 tablet 3   Calcium Carb-Cholecalciferol (CALTRATE 600+D3) 600-20 MG-MCG TABS Take 600 mg by mouth 2 (two) times daily. 60 tablet 3   diclofenac Sodium (VOLTAREN) 1 % GEL Apply 4 g topically 4 (four) times daily. 50 g 1   irbesartan (AVAPRO) 150 MG tablet TAKE 1 TABLET(150 MG) BY MOUTH DAILY 30 tablet 2   levothyroxine (SYNTHROID) 88 MCG tablet Take 1 tablet (88 mcg total) by mouth daily before breakfast. 90 tablet 3   Semaglutide, 1 MG/DOSE, 4 MG/3ML SOPN Inject 1 mg as directed once a week. Dx code E11.65 9 mL 3   No facility-administered medications prior to visit.    Allergies  Allergen Reactions   Penicillins Rash    Has patient had a PCN reaction causing immediate rash, facial/tongue/throat swelling, SOB or lightheadedness with hypotension: Yes Has patient had a PCN reaction causing severe rash involving mucus membranes or skin necrosis: No Has patient had a PCN reaction that required hospitalization No Has patient had a PCN reaction occurring within the last 10 years: Yes If all of the above answers are "NO", then may proceed with Cephalosporin use.    Sulfa Antibiotics Rash    ROS Review of Systems  Constitutional:  Negative for chills and fever.  Eyes:  Negative for visual disturbance.  Respiratory:  Negative for chest tightness and shortness of breath.   Neurological:  Negative for dizziness and headaches.      Objective:    Physical Exam HENT:     Head:  Normocephalic.     Mouth/Throat:     Mouth: Mucous membranes are moist.  Cardiovascular:     Rate and Rhythm: Normal rate.     Heart sounds: Normal heart sounds.  Pulmonary:     Effort: Pulmonary effort is normal.     Breath sounds: Normal breath sounds.  Neurological:     Mental Status: She is alert.     BP 112/75   Pulse 84   Ht 5' (1.524 m)   Wt 168  lb 0.6 oz (76.2 kg)   LMP 04/01/2012   SpO2 97%   BMI 32.82 kg/m  Wt Readings from Last 3 Encounters:  01/06/23 168 lb 0.6 oz (76.2 kg)  12/21/22 170 lb (77.1 kg)  09/20/22 182 lb 12.8 oz (82.9 kg)    Lab Results  Component Value Date   TSH 0.731 01/06/2023   Lab Results  Component Value Date   WBC 4.8 01/06/2023   HGB 10.3 (L) 01/06/2023   HCT 31.4 (L) 01/06/2023   MCV 85 01/06/2023   PLT 183 01/06/2023   Lab Results  Component Value Date   NA 140 12/20/2022   K 4.1 12/20/2022   CO2 22 12/20/2022   GLUCOSE 106 (H) 12/20/2022   BUN 14 12/20/2022   CREATININE 1.29 (H) 12/20/2022   BILITOT 0.4 12/20/2022   ALKPHOS 59 12/20/2022   AST 28 12/20/2022   ALT 22 12/20/2022   PROT 6.9 12/20/2022   ALBUMIN 4.2 12/20/2022   CALCIUM 9.6 12/20/2022   ANIONGAP 9 03/25/2016   EGFR 45 (L) 12/20/2022   Lab Results  Component Value Date   CHOL 144 01/06/2023   Lab Results  Component Value Date   HDL 38 (L) 01/06/2023   Lab Results  Component Value Date   LDLCALC 89 01/06/2023   Lab Results  Component Value Date   TRIG 92 01/06/2023   Lab Results  Component Value Date   CHOLHDL 3.8 01/06/2023   Lab Results  Component Value Date   HGBA1C 5.2 12/21/2022      Assessment & Plan:  Essential (primary) hypertension Assessment & Plan: Controlled She takes irbesartan 150 mg tablet daily She denies headaches, dizziness, blurred vision, chest pain, palpitation, and shortness of breath Encouraged to continue treatment regimen BP Readings from Last 3 Encounters:  01/06/23 112/75  12/21/22 110/76  09/20/22  106/71    Orders: -     CBC with Differential/Platelet  Type 2 diabetes mellitus without complication, without long-term current use of insulin ([place]) Assessment & Plan: She denies the 3ps of diabetes She takes  Ozempic 1 mg weekly  Lab Results  Component Value Date   HGBA1C 5.2 12/21/2022     Sleep disturbance Assessment & Plan: Complains of sleep disturbance Will start trial of Vistaril 25 mg nightly   Sleep disturbances -     hydrOXYzine Pamoate; Take 1 capsule (25 mg total) by mouth at bedtime as needed.  Dispense: 30 capsule; Refill: 0  Mixed hyperlipidemia -     Lipid panel  Other specified hypothyroidism Assessment & Plan: She takes Synthroid 88 mcg daily Encouraged to continue treatment regimen Lab Results  Component Value Date   TSH 0.731 01/06/2023    Orders: -     TSH + free T4  Vitamin D deficiency -     VITAMIN D 25 Hydroxy (Vit-D Deficiency, Fractures)    Follow-up: Return in about 3 months (around 04/06/2023).   Alvira Monday, FNP

## 2023-01-07 DIAGNOSIS — G479 Sleep disorder, unspecified: Secondary | ICD-10-CM | POA: Insufficient documentation

## 2023-01-07 LAB — LIPID PANEL
Chol/HDL Ratio: 3.8 ratio (ref 0.0–4.4)
Cholesterol, Total: 144 mg/dL (ref 100–199)
HDL: 38 mg/dL — ABNORMAL LOW (ref >39)
LDL Chol Calc (NIH): 89 mg/dL (ref 0–99)
Triglycerides: 92 mg/dL (ref 0–149)
VLDL Cholesterol Cal: 17 mg/dL (ref 5–40)

## 2023-01-07 LAB — CBC WITH DIFFERENTIAL/PLATELET
Basophils Absolute: 0 10*3/uL (ref 0.0–0.2)
Basos: 1 %
EOS (ABSOLUTE): 0.2 10*3/uL (ref 0.0–0.4)
Eos: 4 %
Hematocrit: 31.4 % — ABNORMAL LOW (ref 34.0–46.6)
Hemoglobin: 10.3 g/dL — ABNORMAL LOW (ref 11.1–15.9)
Immature Grans (Abs): 0 10*3/uL (ref 0.0–0.1)
Immature Granulocytes: 0 %
Lymphocytes Absolute: 1.7 10*3/uL (ref 0.7–3.1)
Lymphs: 35 %
MCH: 27.8 pg (ref 26.6–33.0)
MCHC: 32.8 g/dL (ref 31.5–35.7)
MCV: 85 fL (ref 79–97)
Monocytes Absolute: 0.4 10*3/uL (ref 0.1–0.9)
Monocytes: 9 %
Neutrophils Absolute: 2.5 10*3/uL (ref 1.4–7.0)
Neutrophils: 51 %
Platelets: 183 10*3/uL (ref 150–450)
RBC: 3.71 x10E6/uL — ABNORMAL LOW (ref 3.77–5.28)
RDW: 12.9 % (ref 11.7–15.4)
WBC: 4.8 10*3/uL (ref 3.4–10.8)

## 2023-01-07 LAB — TSH+FREE T4
Free T4: 1.62 ng/dL (ref 0.82–1.77)
TSH: 0.731 u[IU]/mL (ref 0.450–4.500)

## 2023-01-07 MED ORDER — HYDROXYZINE PAMOATE 25 MG PO CAPS: 25.0000 mg | ORAL_CAPSULE | Freq: Every evening | ORAL | 0 refills | Status: DC | PRN

## 2023-01-07 NOTE — Assessment & Plan Note (Signed)
She denies the 3ps of diabetes She takes  Ozempic 1 mg weekly  Lab Results  Component Value Date   HGBA1C 5.2 12/21/2022

## 2023-01-07 NOTE — Assessment & Plan Note (Signed)
Complains of sleep disturbance Will start trial of Vistaril 25 mg nightly

## 2023-01-07 NOTE — Assessment & Plan Note (Signed)
Controlled She takes irbesartan 150 mg tablet daily She denies headaches, dizziness, blurred vision, chest pain, palpitation, and shortness of breath Encouraged to continue treatment regimen BP Readings from Last 3 Encounters:  01/06/23 112/75  12/21/22 110/76  09/20/22 106/71

## 2023-01-07 NOTE — Assessment & Plan Note (Signed)
She takes Synthroid 88 mcg daily Encouraged to continue treatment regimen Lab Results  Component Value Date   TSH 0.731 01/06/2023

## 2023-02-08 ENCOUNTER — Other Ambulatory Visit: Payer: Self-pay | Admitting: Family Medicine

## 2023-02-08 DIAGNOSIS — I10 Essential (primary) hypertension: Secondary | ICD-10-CM

## 2023-03-06 ENCOUNTER — Telehealth: Payer: Self-pay | Admitting: Nurse Practitioner

## 2023-03-06 NOTE — Telephone Encounter (Signed)
Pt states that her insurance needs a statement from you stating she is Diabetic so that she can get her eyes examined.  Fax # [phone]

## 2023-03-06 NOTE — Telephone Encounter (Signed)
Would a copy of my recent OV work?

## 2023-03-07 NOTE — Telephone Encounter (Signed)
Patient feels that if we send a copy of her office notes, this would be acceptable.

## 2023-03-08 NOTE — Telephone Encounter (Signed)
Please send her the last 2 OV notes, which should suffice.

## 2023-03-09 ENCOUNTER — Other Ambulatory Visit: Payer: Self-pay | Admitting: Nurse Practitioner

## 2023-03-09 NOTE — Telephone Encounter (Signed)
Was this completed so I can sign my note?

## 2023-03-10 ENCOUNTER — Other Ambulatory Visit: Payer: Self-pay

## 2023-03-10 ENCOUNTER — Telehealth: Payer: Self-pay | Admitting: Family Medicine

## 2023-03-10 MED ORDER — ATORVASTATIN CALCIUM 40 MG PO TABS: 40.0000 mg | ORAL_TABLET | Freq: Every day | ORAL | 3 refills | Status: DC

## 2023-03-10 NOTE — Telephone Encounter (Signed)
Prescription Request  03/10/2023  LOV: 01/06/2023  What is the name of the medication or equipment? atorvastatin (LIPITOR) 40 MG tablet   Have you contacted your pharmacy to request a refill? Yes   Which pharmacy would you like this sent to?    [id number] - [place], [place] - [address]  [place] [address] Phone: [phone] Fax: [phone]  Patient notified that their request is being sent to the clinical staff for review and that they should receive a response within 2 business days.   Please advise at Mobile [phone] (mobile)

## 2023-03-10 NOTE — Telephone Encounter (Signed)
Yes, you may close.

## 2023-03-10 NOTE — Telephone Encounter (Signed)
Refill sent.

## 2023-03-10 NOTE — Telephone Encounter (Signed)
I am printing now.

## 2023-04-06 ENCOUNTER — Ambulatory Visit: Payer: Medicare Other | Admitting: Family Medicine

## 2023-04-11 ENCOUNTER — Encounter: Payer: Self-pay | Admitting: Family Medicine

## 2023-04-11 ENCOUNTER — Ambulatory Visit (INDEPENDENT_AMBULATORY_CARE_PROVIDER_SITE_OTHER): Payer: Medicare Other | Admitting: Family Medicine

## 2023-04-11 VITALS — BP 124/77 | HR 73 | Ht 60.0 in | Wt 160.0 lb

## 2023-04-11 DIAGNOSIS — E119 Type 2 diabetes mellitus without complications: Secondary | ICD-10-CM

## 2023-04-11 DIAGNOSIS — E7849 Other hyperlipidemia: Secondary | ICD-10-CM | POA: Diagnosis not present

## 2023-04-11 DIAGNOSIS — E038 Other specified hypothyroidism: Secondary | ICD-10-CM

## 2023-04-11 DIAGNOSIS — I10 Essential (primary) hypertension: Secondary | ICD-10-CM | POA: Diagnosis not present

## 2023-04-11 DIAGNOSIS — Z7985 Long-term (current) use of injectable non-insulin antidiabetic drugs: Secondary | ICD-10-CM

## 2023-04-11 NOTE — Assessment & Plan Note (Signed)
She denies the 3ps of diabetes She is on Ozempic 1 mg weekly  Encouraged continue following-up with her endocrinologist Lab Results  Component Value Date   HGBA1C 5.2 12/21/2022

## 2023-04-11 NOTE — Assessment & Plan Note (Signed)
She denies chest pain, palpitations, shortness of breath, and chest tightness She takes Lipitor 40 mg daily Encouraged to continue treatment regimen Lab Results  Component Value Date   CHOL 144 01/06/2023   HDL 38 (L) 01/06/2023   LDLCALC 89 01/06/2023   TRIG 92 01/06/2023   CHOLHDL 3.8 01/06/2023

## 2023-04-11 NOTE — Assessment & Plan Note (Signed)
Controlled She takes irbesartan 150 mg tablet daily She denies headaches, dizziness, blurred vision, chest pain, palpitation, and shortness of breath Encouraged to continue treatment regimen BP Readings from Last 3 Encounters:  04/11/23 124/77  01/06/23 112/75  12/21/22 110/76

## 2023-04-11 NOTE — Patient Instructions (Addendum)
I appreciate the opportunity to provide care to you today!    Follow up:  3 months   Please continue to a heart-healthy diet and increase your physical activities. Try to exercise for 30mins at least five days a week.      It was a pleasure to see you and I look forward to continuing to work together on your health and well-being. Please do not hesitate to call the office if you need care or have questions about your care.   Have a wonderful day and week. With Gratitude, Besnik Febus MSN, FNP-BC  

## 2023-04-11 NOTE — Progress Notes (Signed)
Established Patient Office Visit  Subjective:  Patient ID: Barbara Bennett, female    DOB: [birth year]  Age: 67 y.o. MRN: [id number]  CC:  Chief Complaint  Patient presents with   Chronic Care Management    3 month f/u.     HPI Barbara Bennett is a 67 y.o. female with past medical history of hypertension, hypothyroidism, hyperlipidemia presents for f/u of  chronic medical conditions. For the details of today's visit, please refer to the assessment and plan.   Past Medical History:  Diagnosis Date   Anemia    Anemia, unspecified    Arthritis    knees   Bell's palsy    Hyperlipidemia, unspecified    Hypothyroidism, unspecified    Obesity, unspecified    Type 2 diabetes mellitus without complications (HCC)    no longer on medications, previously on metformin for borderline DM, A1C 5.6    Past Surgical History:  Procedure Laterality Date   CATARACT EXTRACTION W/PHACO Left 03/31/2016   Procedure: CATARACT EXTRACTION PHACO AND INTRAOCULAR LENS PLACEMENT (IOC);  Surgeon: Gemma Payor, MD;  Location: [facility];  Service: Ophthalmology;  Laterality: Left;  CDE: 6.45   CATARACT EXTRACTION W/PHACO Right 04/28/2016   Procedure: CATARACT EXTRACTION PHACO AND INTRAOCULAR LENS PLACEMENT (IOC);  Surgeon: Gemma Payor, MD;  Location: [facility];  Service: Ophthalmology;  Laterality: Right;  CDE 5.00   COLONOSCOPY N/A 12/30/2020   Procedure: COLONOSCOPY;  Surgeon: Corbin Ade, MD;  Location: [facility];  Service: Endoscopy;  Laterality: N/A;  9:30am   KNEE ARTHROSCOPY Left     Family History  Problem Relation Age of Onset   Diabetes Mother    Heart disease Mother    Heart disease Father    Diabetes Sister    Renal Disease Sister        Dialysis   Asthma Sister    Hypertension Sister    Diabetes Sister    Arthritis Sister    Diabetes Sister    Renal Disease Sister        Dialysis   Hypertension Sister    Migraines Daughter    Pneumonia Son    Colon cancer Neg Hx    Sickle cell  anemia Neg Hx    Thalassemia Neg Hx    Leukemia Neg Hx     Social History   Socioeconomic History   Marital status: Married    Spouse name: Not on file   Number of children: 2   Years of education: 12   Highest education level: 12th grade  Occupational History   Occupation: Retired- worked at Clinical biochemist  Tobacco Use   Smoking status: Never   Smokeless tobacco: Never  Building services engineer Use: Never used  Substance and Sexual Activity   Alcohol use: No   Drug use: No   Sexual activity: Not Currently  Other Topics Concern   Not on file  Social History Narrative   Married for 26 years,second.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      Son deceased   Daughter lives nearby   Social Determinants of Health   Financial Resource Strain: Low Risk  (04/07/2023)   Overall Financial Resource Strain (CARDIA)    Difficulty of Paying Living Expenses: Not hard at all  Food Insecurity: No Food Insecurity (04/07/2023)   Hunger Vital Sign    Worried About Running Out of Food in the Last Year: Never true    Ran Out of Food in the Last Year:  Never true  Transportation Needs: No Transportation Needs (04/07/2023)   PRAPARE - Administrator, Civil Service (Medical): No    Lack of Transportation (Non-Medical): No  Physical Activity: Insufficiently Active (04/07/2023)   Exercise Vital Sign    Days of Exercise per Week: 3 days    Minutes of Exercise per Session: 20 min  Stress: No Stress Concern Present (04/07/2023)   [employer] of Occupational Health - Occupational Stress Questionnaire    Feeling of Stress : Not at all  Social Connections: Socially Integrated (04/07/2023)   Social Connection and Isolation Panel [NHANES]    Frequency of Communication with Friends and Family: More than three times a week    Frequency of Social Gatherings with Friends and Family: More than three times a week    Attends Religious Services: More than 4 times per year    Active Member of  [employer] or Organizations: Yes    Attends Engineer, structural: More than 4 times per year    Marital Status: Married  Catering manager Violence: Not At Risk (10/04/2021)   Humiliation, Afraid, Rape, and Kick questionnaire    Fear of Current or Ex-Partner: No    Emotionally Abused: No    Physically Abused: No    Sexually Abused: No    Outpatient Medications Prior to Visit  Medication Sig Dispense Refill   atorvastatin (LIPITOR) 40 MG tablet Take 1 tablet (40 mg total) by mouth daily. 90 tablet 3   Calcium Carb-Cholecalciferol (CALTRATE 600+D3) 600-20 MG-MCG TABS Take 600 mg by mouth 2 (two) times daily. 60 tablet 3   diclofenac Sodium (VOLTAREN) 1 % GEL Apply 4 g topically 4 (four) times daily. 50 g 1   hydrOXYzine (VISTARIL) 25 MG capsule Take 1 capsule (25 mg total) by mouth at bedtime as needed. 30 capsule 0   irbesartan (AVAPRO) 150 MG tablet TAKE 1 TABLET(150 MG) BY MOUTH DAILY 30 tablet 2   levothyroxine (SYNTHROID) 88 MCG tablet Take 1 tablet (88 mcg total) by mouth daily before breakfast. 90 tablet 3   Semaglutide, 1 MG/DOSE, 4 MG/3ML SOPN Inject 1 mg as directed once a week. Dx code E11.65 9 mL 3   No facility-administered medications prior to visit.    Allergies  Allergen Reactions   Penicillins Rash    Has patient had a PCN reaction causing immediate rash, facial/tongue/throat swelling, SOB or lightheadedness with hypotension: Yes Has patient had a PCN reaction causing severe rash involving mucus membranes or skin necrosis: No Has patient had a PCN reaction that required hospitalization No Has patient had a PCN reaction occurring within the last 10 years: Yes If all of the above answers are "NO", then may proceed with Cephalosporin use.    Sulfa Antibiotics Rash    ROS Review of Systems  Constitutional:  Negative for chills and fever.  Eyes:  Negative for visual disturbance.  Respiratory:  Negative for chest tightness and shortness of breath.   Neurological:   Negative for dizziness and headaches.      Objective:    Physical Exam HENT:     Head: Normocephalic.     Mouth/Throat:     Mouth: Mucous membranes are moist.  Cardiovascular:     Rate and Rhythm: Normal rate.     Heart sounds: Normal heart sounds.  Pulmonary:     Effort: Pulmonary effort is normal.     Breath sounds: Normal breath sounds.  Neurological:     Mental Status: She is alert.  BP 124/77   Pulse 73   Ht 5' (1.524 m)   Wt 160 lb 0.6 oz (72.6 kg)   LMP 04/01/2012   SpO2 96%   BMI 31.26 kg/m  Wt Readings from Last 3 Encounters:  04/11/23 160 lb 0.6 oz (72.6 kg)  01/06/23 168 lb 0.6 oz (76.2 kg)  12/21/22 170 lb (77.1 kg)    Lab Results  Component Value Date   TSH 0.731 01/06/2023   Lab Results  Component Value Date   WBC 4.8 01/06/2023   HGB 10.3 (L) 01/06/2023   HCT 31.4 (L) 01/06/2023   MCV 85 01/06/2023   PLT 183 01/06/2023   Lab Results  Component Value Date   NA 140 12/20/2022   K 4.1 12/20/2022   CO2 22 12/20/2022   GLUCOSE 106 (H) 12/20/2022   BUN 14 12/20/2022   CREATININE 1.29 (H) 12/20/2022   BILITOT 0.4 12/20/2022   ALKPHOS 59 12/20/2022   AST 28 12/20/2022   ALT 22 12/20/2022   PROT 6.9 12/20/2022   ALBUMIN 4.2 12/20/2022   CALCIUM 9.6 12/20/2022   ANIONGAP 9 03/25/2016   EGFR 45 (L) 12/20/2022   Lab Results  Component Value Date   CHOL 144 01/06/2023   Lab Results  Component Value Date   HDL 38 (L) 01/06/2023   Lab Results  Component Value Date   LDLCALC 89 01/06/2023   Lab Results  Component Value Date   TRIG 92 01/06/2023   Lab Results  Component Value Date   CHOLHDL 3.8 01/06/2023   Lab Results  Component Value Date   HGBA1C 5.2 12/21/2022      Assessment & Plan:  Type 2 diabetes mellitus without complication, without long-term current use of insulin (HCC) Assessment & Plan: She denies the 3ps of diabetes She is on Ozempic 1 mg weekly  Encouraged continue following-up with her  endocrinologist Lab Results  Component Value Date   HGBA1C 5.2 12/21/2022    Orders: -     Microalbumin / creatinine urine ratio  Essential (primary) hypertension Assessment & Plan: Controlled She takes irbesartan 150 mg tablet daily She denies headaches, dizziness, blurred vision, chest pain, palpitation, and shortness of breath Encouraged to continue treatment regimen BP Readings from Last 3 Encounters:  04/11/23 124/77  01/06/23 112/75  12/21/22 110/76     Other specified hypothyroidism Assessment & Plan: She takes Synthroid 88 mcg daily Encouraged to continue treatment regimen Lab Results  Component Value Date   TSH 0.731 01/06/2023     Other hyperlipidemia Assessment & Plan: She denies chest pain, palpitations, shortness of breath, and chest tightness She takes Lipitor 40 mg daily Encouraged to continue treatment regimen Lab Results  Component Value Date   CHOL 144 01/06/2023   HDL 38 (L) 01/06/2023   LDLCALC 89 01/06/2023   TRIG 92 01/06/2023   CHOLHDL 3.8 01/06/2023        Follow-up: Return in about 3 months (around 07/12/2023).   Gilmore Laroche, FNP

## 2023-04-11 NOTE — Assessment & Plan Note (Signed)
She takes Synthroid 88 mcg daily Encouraged to continue treatment regimen Lab Results  Component Value Date   TSH 0.731 01/06/2023   

## 2023-04-13 LAB — MICROALBUMIN / CREATININE URINE RATIO
Creatinine, Urine: 97.8 mg/dL
Microalb/Creat Ratio: 11 mg/g creat (ref 0–29)
Microalbumin, Urine: 10.3 ug/mL

## 2023-05-07 ENCOUNTER — Other Ambulatory Visit: Payer: Self-pay | Admitting: Family Medicine

## 2023-05-07 DIAGNOSIS — I10 Essential (primary) hypertension: Secondary | ICD-10-CM

## 2023-05-13 ENCOUNTER — Emergency Department (HOSPITAL_COMMUNITY)
Admission: EM | Admit: 2023-05-13 | Discharge: 2023-05-13 | Disposition: A | Discharge status: Home / Self Care | Payer: Medicare Other | Attending: Emergency Medicine | Admitting: Emergency Medicine

## 2023-05-13 ENCOUNTER — Emergency Department (HOSPITAL_COMMUNITY): Payer: Medicare Other

## 2023-05-13 ENCOUNTER — Encounter (HOSPITAL_COMMUNITY): Payer: Self-pay | Admitting: Emergency Medicine

## 2023-05-13 ENCOUNTER — Other Ambulatory Visit: Payer: Self-pay

## 2023-05-13 DIAGNOSIS — E860 Dehydration: Secondary | ICD-10-CM | POA: Diagnosis not present

## 2023-05-13 DIAGNOSIS — R1110 Vomiting, unspecified: Secondary | ICD-10-CM | POA: Insufficient documentation

## 2023-05-13 DIAGNOSIS — Z79899 Other long term (current) drug therapy: Secondary | ICD-10-CM | POA: Insufficient documentation

## 2023-05-13 DIAGNOSIS — I10 Essential (primary) hypertension: Secondary | ICD-10-CM | POA: Insufficient documentation

## 2023-05-13 DIAGNOSIS — Z794 Long term (current) use of insulin: Secondary | ICD-10-CM | POA: Diagnosis not present

## 2023-05-13 DIAGNOSIS — R55 Syncope and collapse: Secondary | ICD-10-CM | POA: Insufficient documentation

## 2023-05-13 LAB — BASIC METABOLIC PANEL
Anion gap: 12 (ref 5–15)
BUN: 16 mg/dL (ref 8–23)
CO2: 21 mmol/L — ABNORMAL LOW (ref 22–32)
Calcium: 9.1 mg/dL (ref 8.9–10.3)
Chloride: 102 mmol/L (ref 98–111)
Creatinine, Ser: 1.27 mg/dL — ABNORMAL HIGH (ref 0.44–1.00)
GFR, Estimated: 46 mL/min — ABNORMAL LOW (ref >60)
Glucose, Bld: 150 mg/dL — ABNORMAL HIGH (ref 70–99)
Potassium: 3.7 mmol/L (ref 3.5–5.1)
Sodium: 135 mmol/L (ref 135–145)

## 2023-05-13 LAB — HEPATIC FUNCTION PANEL
ALT: 19 U/L (ref 0–44)
AST: 29 U/L (ref 15–41)
Albumin: 4 g/dL (ref 3.5–5.0)
Alkaline Phosphatase: 39 U/L (ref 38–126)
Bilirubin, Direct: 0.2 mg/dL (ref 0.0–0.2)
Indirect Bilirubin: 0.9 mg/dL (ref 0.3–0.9)
Total Bilirubin: 1.1 mg/dL (ref 0.3–1.2)
Total Protein: 7.7 g/dL (ref 6.5–8.1)

## 2023-05-13 LAB — CBG MONITORING, ED: Glucose-Capillary: 118 mg/dL — ABNORMAL HIGH (ref 70–99)

## 2023-05-13 LAB — CBC
HCT: 35 % — ABNORMAL LOW (ref 36.0–46.0)
Hemoglobin: 11.6 g/dL — ABNORMAL LOW (ref 12.0–15.0)
MCH: 27.7 pg (ref 26.0–34.0)
MCHC: 33.1 g/dL (ref 30.0–36.0)
MCV: 83.5 fL (ref 80.0–100.0)
Platelets: 157 10*3/uL (ref 150–400)
RBC: 4.19 MIL/uL (ref 3.87–5.11)
RDW: 12.9 % (ref 11.5–15.5)
WBC: 7.6 10*3/uL (ref 4.0–10.5)
nRBC: 0 % (ref 0.0–0.2)

## 2023-05-13 MED ORDER — SODIUM CHLORIDE 0.9 % IV BOLUS
1000.0000 mL | Freq: Once | INTRAVENOUS | Status: AC
  Administered 2023-05-13: 1000 mL via INTRAVENOUS

## 2023-05-13 NOTE — ED Triage Notes (Signed)
Pt states she had been outside for a couple of hrs today and was walking back from a neighbors house when she "started to feel funny". Pt states she called her husband to have him come and assist her and per her daughter, only made it to front steps when she passed out completely and was vomiting on herself. Pt states she continued to vomit on way her (2-3 times).

## 2023-05-13 NOTE — ED Provider Notes (Signed)
[facility] EMERGENCY DEPARTMENT AT [facility] Provider Note   CSN: [id number] Arrival date & time: 05/13/23  2005     History {Add pertinent medical, surgical, social history, OB history to HPI:1} Chief Complaint  Patient presents with   Loss of Consciousness    Barbara Bennett is a 67 y.o. female.  Patient with diabetes and hyperlipidemia.  She was outside for couple hours and then started vomiting and passed out.   Loss of Consciousness      Home Medications Prior to Admission medications   Medication Sig Start Date End Date Taking? Authorizing Provider  atorvastatin (LIPITOR) 40 MG tablet Take 1 tablet (40 mg total) by mouth daily. 03/10/23   Gilmore Laroche, FNP  Calcium Carb-Cholecalciferol (CALTRATE 600+D3) 600-20 MG-MCG TABS Take 600 mg by mouth 2 (two) times daily. 06/14/22   Donell Beers, FNP  diclofenac Sodium (VOLTAREN) 1 % GEL Apply 4 g topically 4 (four) times daily. 04/22/22   Paseda, Baird Kay, FNP  hydrOXYzine (VISTARIL) 25 MG capsule Take 1 capsule (25 mg total) by mouth at bedtime as needed. 01/07/23   Gilmore Laroche, FNP  irbesartan (AVAPRO) 150 MG tablet TAKE 1 TABLET(150 MG) BY MOUTH DAILY 05/08/23   Gilmore Laroche, FNP  levothyroxine (SYNTHROID) 88 MCG tablet Take 1 tablet (88 mcg total) by mouth daily before breakfast. 12/21/22   Dani Gobble, NP  Semaglutide, 1 MG/DOSE, 4 MG/3ML SOPN Inject 1 mg as directed once a week. Dx code E11.65 01/04/23   Dani Gobble, NP      Allergies    Penicillins and Sulfa antibiotics    Review of Systems   Review of Systems  Cardiovascular:  Positive for syncope.    Physical Exam Updated Vital Signs BP 114/87 (BP Location: Right Arm)   Pulse 74   Temp 97.6 F (36.4 C) (Oral)   Resp 18   Ht 5' (1.524 m)   Wt 70.3 kg   LMP 04/01/2012   SpO2 100%   BMI 30.27 kg/m  Physical Exam  ED Results / Procedures / Treatments   Labs (all labs ordered are listed, but only abnormal results are  displayed) Labs Reviewed  BASIC METABOLIC PANEL - Abnormal; Notable for the following components:      Result Value   CO2 21 (*)    Glucose, Bld 150 (*)    Creatinine, Ser 1.27 (*)    GFR, Estimated 46 (*)    All other components within normal limits  CBC - Abnormal; Notable for the following components:   Hemoglobin 11.6 (*)    HCT 35.0 (*)    All other components within normal limits  CBG MONITORING, ED - Abnormal; Notable for the following components:   Glucose-Capillary 118 (*)    All other components within normal limits  HEPATIC FUNCTION PANEL  URINALYSIS, ROUTINE W REFLEX MICROSCOPIC  CBG MONITORING, ED    EKG EKG Interpretation  Date/Time:  Saturday May 13 2023 20:27:14 EDT Ventricular Rate:  80 PR Interval:  188 QRS Duration: 68 QT Interval:  364 QTC Calculation: 419 R Axis:   38 Text Interpretation: Sinus rhythm with occasional Premature ventricular complexes Otherwise normal ECG When compared with ECG of 25-Mar-2016 13:27, No significant change was found Confirmed by Bethann Berkshire [phone]) on 05/13/2023 8:45:36 PM  Radiology CT Head Wo Contrast  Result Date: 05/13/2023 CLINICAL DATA:  Syncope. EXAM: CT HEAD WITHOUT CONTRAST TECHNIQUE: Contiguous axial images were obtained from the base of the skull through the  vertex without intravenous contrast. RADIATION DOSE REDUCTION: This exam was performed according to the departmental dose-optimization program which includes automated exposure control, adjustment of the mA and/or kV according to patient size and/or use of iterative reconstruction technique. COMPARISON:  None Available. FINDINGS: Brain: No evidence of acute infarction, hemorrhage, hydrocephalus, extra-axial collection or mass lesion/mass effect. Vascular: No hyperdense vessel or unexpected calcification. Skull: Normal. Negative for fracture or focal lesion. Sinuses/Orbits: No acute finding. Other: None. IMPRESSION: Normal head CT. Electronically Signed   By:  Aram Candela M.D.   On: 05/13/2023 21:09   DG Chest Port 1 View  Result Date: 05/13/2023 CLINICAL DATA:  Shortness of breath.  Syncope EXAM: PORTABLE CHEST 1 VIEW COMPARISON:  X-ray 05/01/2018 FINDINGS: No consolidation, pneumothorax or effusion. No edema. Normal cardiopericardial silhouette. Overlapping cardiac leads. IMPRESSION: No acute cardiopulmonary disease Electronically Signed   By: Karen Kays M.D.   On: 05/13/2023 21:06    Procedures Procedures  {Document cardiac monitor, telemetry assessment procedure when appropriate:1}  Medications Ordered in ED Medications  sodium chloride 0.9 % bolus 1,000 mL (1,000 mLs Intravenous New Bag/Given 05/13/23 2114)    ED Course/ Medical Decision Making/ A&P   {   Click here for ABCD2, HEART and other calculatorsREFRESH Note before signing :1}                          Medical Decision Making Amount and/or Complexity of Data Reviewed Labs: ordered. Radiology: ordered.   Patient with dehydration and heat exhaustion.  Patient improved with IV fluids will follow-up with PCP  {Document critical care time when appropriate:1} {Document review of labs and clinical decision tools ie heart score, Chads2Vasc2 etc:1}  {Document your independent review of radiology images, and any outside records:1} {Document your discussion with family members, caretakers, and with consultants:1} {Document social determinants of health affecting pt's care:1} {Document your decision making why or why not admission, treatments were needed:1} Final Clinical Impression(s) / ED Diagnoses Final diagnoses:  Syncope and collapse  Dehydration    Rx / DC Orders ED Discharge Orders     None

## 2023-05-13 NOTE — Discharge Instructions (Signed)
Drink plenty of fluids and follow-up with your doctor this week for recheck °

## 2023-05-16 ENCOUNTER — Telehealth: Payer: Self-pay

## 2023-05-16 NOTE — Transitions of Care (Post Inpatient/ED Visit) (Signed)
   05/16/2023  Name: Barbara Bennett MRN: [id number] DOB: 1956/09/14  Today's TOC FU Call Status: Today's TOC FU Call Status:: Successful TOC FU Call Competed TOC FU Call Complete Date: 05/16/23  Transition Care Management Follow-up Telephone Call Date of Discharge: 05/13/23 [facility] Type of Discharge: Emergency Department Reason for ED Visit: Other: (Syncope) How have you been since you were released from the hospital?: Better Any questions or concerns?: No  Items Reviewed: Did you receive and understand the discharge instructions provided?: Yes Medications obtained,verified, and reconciled?: Yes (Medications Reviewed) Any new allergies since your discharge?: No Dietary orders reviewed?: No Do you have support at home?: Yes People in Home: spouse Name of Support/Comfort Primary Source: Peyton Najjar  Medications Reviewed Today: Medications Reviewed Today     Reviewed by Jodelle Gross, RN (Case Manager) on 05/16/23 at 1622  Med List Status: <None>   Medication Order Taking? Sig Documenting Provider Last Dose Status Informant  atorvastatin (LIPITOR) 40 MG tablet [id number] Yes Take 1 tablet (40 mg total) by mouth daily. Gilmore Laroche, FNP Taking Active   Calcium Carb-Cholecalciferol (CALTRATE 600+D3) 600-20 MG-MCG TABS [id number] Yes Take 600 mg by mouth 2 (two) times daily. Donell Beers, FNP Taking Active   diclofenac Sodium (VOLTAREN) 1 % GEL [id number] No Apply 4 g topically 4 (four) times daily. Donell Beers, FNP Unknown Active   hydrOXYzine (VISTARIL) 25 MG capsule [id number] No Take 1 capsule (25 mg total) by mouth at bedtime as needed. Gilmore Laroche, FNP Unknown Active   irbesartan (AVAPRO) 150 MG tablet [id number] Yes TAKE 1 TABLET(150 MG) BY MOUTH DAILY Gilmore Laroche, FNP Taking Active   levothyroxine (SYNTHROID) 88 MCG tablet [id number] Yes Take 1 tablet (88 mcg total) by mouth daily before breakfast. Dani Gobble, NP Taking Active    Semaglutide, 1 MG/DOSE, 4 MG/3ML SOPN [id number] Yes Inject 1 mg as directed once a week. Dx code E11.65 Dani Gobble, NP Taking Active            Home Care and Equipment/Supplies: Were Home Health Services Ordered?: No Any new equipment or medical supplies ordered?: No  Functional Questionnaire: Do you need assistance with bathing/showering or dressing?: No Do you need assistance with meal preparation?: No Do you need assistance with eating?: No Do you have difficulty maintaining continence: No Do you need assistance with getting out of bed/getting out of a chair/moving?: No Do you have difficulty managing or taking your medications?: No  Follow up appointments reviewed: PCP Follow-up appointment confirmed?: Yes Date of PCP follow-up appointment?: 05/23/23 Follow-up Provider: Gilmore Laroche, NP Specialist Hospital Follow-up appointment confirmed?: NA Do you need transportation to your follow-up appointment?: No Do you understand care options if your condition(s) worsen?: Yes-patient verbalized understanding   Interventions Today    Flowsheet Row Most Recent Value  Chronic Disease   Chronic disease during today's visit Other  [Syncope]       TOC Interventions Today    Flowsheet Row Most Recent Value  TOC Interventions   TOC Interventions Discussed/Reviewed TOC Interventions Discussed, TOC Interventions Reviewed, Arranged PCP follow up less than 12 days/Care Guide scheduled       Jodelle Gross, RN, BSN, CCM Care Management Coordinator [place] Phone: [phone]/Fax: [phone]

## 2023-05-22 NOTE — Progress Notes (Unsigned)
Established Patient Office Visit  Subjective:  Patient ID: Barbara Bennett, female    DOB: 1956/02/02  Age: 67 y.o. MRN: [id number]  CC: No chief complaint on file.   HPI Barbara Bennett is a 67 y.o. female with past medical history of hypertension hyperlipidemia presents for ED f/u. For the details of today's visit, please refer to the assessment and plan.    Syncope and collapse: The patient was seen in the ED on 05/13/2023 for single episode of syncope and dizziness.  Urinalysis was reassuring,CT scan of the head was negative, EKG showed normal sinus rhythm.  The patient was noted to be dehydrated and was given sodium chloride bolus and discharged home.  At today's visit, the patient denies symptoms of syncope, dizziness.  No reports of headaches or lightheadedness.  No recent falls reported.  Past Medical History:  Diagnosis Date   Anemia    Anemia, unspecified    Arthritis    knees   Bell's palsy    Hyperlipidemia, unspecified    Hypothyroidism, unspecified    Obesity, unspecified    Type 2 diabetes mellitus without complications (HCC)    no longer on medications, previously on metformin for borderline DM, A1C 5.6    Past Surgical History:  Procedure Laterality Date   CATARACT EXTRACTION W/PHACO Left 03/31/2016   Procedure: CATARACT EXTRACTION PHACO AND INTRAOCULAR LENS PLACEMENT (IOC);  Surgeon: Gemma Payor, MD;  Location: [facility];  Service: Ophthalmology;  Laterality: Left;  CDE: 6.45   CATARACT EXTRACTION W/PHACO Right 04/28/2016   Procedure: CATARACT EXTRACTION PHACO AND INTRAOCULAR LENS PLACEMENT (IOC);  Surgeon: Gemma Payor, MD;  Location: [facility];  Service: Ophthalmology;  Laterality: Right;  CDE 5.00   COLONOSCOPY N/A 12/30/2020   Procedure: COLONOSCOPY;  Surgeon: Corbin Ade, MD;  Location: [facility];  Service: Endoscopy;  Laterality: N/A;  9:30am   KNEE ARTHROSCOPY Left     Family History  Problem Relation Age of Onset   Diabetes Mother    Heart disease  Mother    Heart disease Father    Diabetes Sister    Renal Disease Sister        Dialysis   Asthma Sister    Hypertension Sister    Diabetes Sister    Arthritis Sister    Diabetes Sister    Renal Disease Sister        Dialysis   Hypertension Sister    Migraines Daughter    Pneumonia Son    Colon cancer Neg Hx    Sickle cell anemia Neg Hx    Thalassemia Neg Hx    Leukemia Neg Hx     Social History   Socioeconomic History   Marital status: Married    Spouse name: Not on file   Number of children: 2   Years of education: 12   Highest education level: 12th grade  Occupational History   Occupation: Retired- worked at Clinical biochemist  Tobacco Use   Smoking status: Never   Smokeless tobacco: Never  Building services engineer Use: Never used  Substance and Sexual Activity   Alcohol use: No   Drug use: No   Sexual activity: Not Currently  Other Topics Concern   Not on file  Social History Narrative   Married for 26 years,second.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      Son deceased   Daughter lives nearby   Social Determinants of Health   Financial Resource Strain: Low Risk  (04/07/2023)  Overall Financial Resource Strain (CARDIA)    Difficulty of Paying Living Expenses: Not hard at all  Food Insecurity: No Food Insecurity (04/07/2023)   Hunger Vital Sign    Worried About Running Out of Food in the Last Year: Never true    Ran Out of Food in the Last Year: Never true  Transportation Needs: No Transportation Needs (04/07/2023)   PRAPARE - Administrator, Civil Service (Medical): No    Lack of Transportation (Non-Medical): No  Physical Activity: Insufficiently Active (04/07/2023)   Exercise Vital Sign    Days of Exercise per Week: 3 days    Minutes of Exercise per Session: 20 min  Stress: No Stress Concern Present (04/07/2023)   [employer] of Occupational Health - Occupational Stress Questionnaire    Feeling of Stress : Not at all  Social  Connections: Socially Integrated (04/07/2023)   Social Connection and Isolation Panel [NHANES]    Frequency of Communication with Friends and Family: More than three times a week    Frequency of Social Gatherings with Friends and Family: More than three times a week    Attends Religious Services: More than 4 times per year    Active Member of [employer] or Organizations: Yes    Attends Engineer, structural: More than 4 times per year    Marital Status: Married  Catering manager Violence: Not At Risk (10/04/2021)   Humiliation, Afraid, Rape, and Kick questionnaire    Fear of Current or Ex-Partner: No    Emotionally Abused: No    Physically Abused: No    Sexually Abused: No    Outpatient Medications Prior to Visit  Medication Sig Dispense Refill   atorvastatin (LIPITOR) 40 MG tablet Take 1 tablet (40 mg total) by mouth daily. 90 tablet 3   Calcium Carb-Cholecalciferol (CALTRATE 600+D3) 600-20 MG-MCG TABS Take 600 mg by mouth 2 (two) times daily. 60 tablet 3   diclofenac Sodium (VOLTAREN) 1 % GEL Apply 4 g topically 4 (four) times daily. 50 g 1   hydrOXYzine (VISTARIL) 25 MG capsule Take 1 capsule (25 mg total) by mouth at bedtime as needed. 30 capsule 0   irbesartan (AVAPRO) 150 MG tablet TAKE 1 TABLET(150 MG) BY MOUTH DAILY 30 tablet 2   levothyroxine (SYNTHROID) 88 MCG tablet Take 1 tablet (88 mcg total) by mouth daily before breakfast. 90 tablet 3   Semaglutide, 1 MG/DOSE, 4 MG/3ML SOPN Inject 1 mg as directed once a week. Dx code E11.65 9 mL 3   No facility-administered medications prior to visit.    Allergies  Allergen Reactions   Penicillins Rash    Has patient had a PCN reaction causing immediate rash, facial/tongue/throat swelling, SOB or lightheadedness with hypotension: Yes Has patient had a PCN reaction causing severe rash involving mucus membranes or skin necrosis: No Has patient had a PCN reaction that required hospitalization No Has patient had a PCN reaction  occurring within the last 10 years: Yes If all of the above answers are "NO", then may proceed with Cephalosporin use.    Sulfa Antibiotics Rash    ROS Review of Systems    Objective:    Physical Exam  LMP 04/01/2012  Wt Readings from Last 3 Encounters:  05/13/23 155 lb (70.3 kg)  04/11/23 160 lb 0.6 oz (72.6 kg)  01/06/23 168 lb 0.6 oz (76.2 kg)    Lab Results  Component Value Date   TSH 0.731 01/06/2023   Lab Results  Component Value Date  WBC 7.6 05/13/2023   HGB 11.6 (L) 05/13/2023   HCT 35.0 (L) 05/13/2023   MCV 83.5 05/13/2023   PLT 157 05/13/2023   Lab Results  Component Value Date   NA 135 05/13/2023   K 3.7 05/13/2023   CO2 21 (L) 05/13/2023   GLUCOSE 150 (H) 05/13/2023   BUN 16 05/13/2023   CREATININE 1.27 (H) 05/13/2023   BILITOT 1.1 05/13/2023   ALKPHOS 39 05/13/2023   AST 29 05/13/2023   ALT 19 05/13/2023   PROT 7.7 05/13/2023   ALBUMIN 4.0 05/13/2023   CALCIUM 9.1 05/13/2023   ANIONGAP 12 05/13/2023   EGFR 45 (L) 12/20/2022   Lab Results  Component Value Date   CHOL 144 01/06/2023   Lab Results  Component Value Date   HDL 38 (L) 01/06/2023   Lab Results  Component Value Date   LDLCALC 89 01/06/2023   Lab Results  Component Value Date   TRIG 92 01/06/2023   Lab Results  Component Value Date   CHOLHDL 3.8 01/06/2023   Lab Results  Component Value Date   HGBA1C 5.2 12/21/2022      Assessment & Plan:  There are no diagnoses linked to this encounter.  Follow-up: No follow-ups on file.   Gilmore Laroche, FNP

## 2023-05-23 ENCOUNTER — Ambulatory Visit (INDEPENDENT_AMBULATORY_CARE_PROVIDER_SITE_OTHER): Payer: Medicare Other | Admitting: Family Medicine

## 2023-05-23 ENCOUNTER — Encounter: Payer: Self-pay | Admitting: Family Medicine

## 2023-05-23 VITALS — BP 114/75 | HR 88 | Ht 60.0 in | Wt 155.0 lb

## 2023-05-23 DIAGNOSIS — R55 Syncope and collapse: Secondary | ICD-10-CM | POA: Insufficient documentation

## 2023-05-23 NOTE — Assessment & Plan Note (Signed)
Encouraged to increase her fluid intake to at least 64 ounces daily Pending BMP

## 2023-05-23 NOTE — Patient Instructions (Addendum)
I appreciate the opportunity to provide care to you today!    Follow up:  07/12/2023  Labs: please stop by the lab today to get your blood drawn (BMP)  I recommend drinking at least 64 ounces of water daily  Please refer to the clinical reference for more information on dehydration attached your AVS   Please continue to a heart-healthy diet and increase your physical activities. Try to exercise for at least five days a week.      It was a pleasure to see you and I look forward to continuing to work together on your health and well-being. Please do not hesitate to call the office if you need care or have questions about your care.   Have a wonderful day and week. With Gratitude, Gilmore Laroche MSN, FNP-BC

## 2023-05-24 LAB — BMP8+EGFR
BUN/Creatinine Ratio: 16 (ref 12–28)
BUN: 16 mg/dL (ref 8–27)
CO2: 22 mmol/L (ref 20–29)
Calcium: 9.6 mg/dL (ref 8.7–10.3)
Chloride: 105 mmol/L (ref 96–106)
Creatinine, Ser: 0.98 mg/dL (ref 0.57–1.00)
Glucose: 80 mg/dL (ref 70–99)
Potassium: 4 mmol/L (ref 3.5–5.2)
Sodium: 141 mmol/L (ref 134–144)
eGFR: 62 mL/min/{1.73_m2} (ref >59)

## 2023-05-31 ENCOUNTER — Telehealth: Payer: Medicare Other | Admitting: Nurse Practitioner

## 2023-05-31 DIAGNOSIS — L03031 Cellulitis of right toe: Secondary | ICD-10-CM | POA: Diagnosis not present

## 2023-05-31 MED ORDER — DOXYCYCLINE HYCLATE 100 MG PO TABS: 100.0000 mg | ORAL_TABLET | Freq: Two times a day (BID) | ORAL | 0 refills | Status: DC

## 2023-05-31 NOTE — Progress Notes (Signed)
Virtual Visit Consent   Barbara Bennett, you are scheduled for a virtual visit with Barbara Daphine Deutscher, FNP, a [facility] provider, today.     Just as with appointments in the office, your consent must be obtained to participate.  Your consent will be active for this visit and any virtual visit you may have with one of our providers in the next 365 days.     If you have a MyChart account, a copy of this consent can be sent to you electronically.  All virtual visits are billed to your insurance company just like a traditional visit in the office.    As this is a virtual visit, video technology does not allow for your provider to perform a traditional examination.  This may limit your provider's ability to fully assess your condition.  If your provider identifies any concerns that need to be evaluated in person or the need to arrange testing (such as labs, EKG, etc.), we will make arrangements to do so.     Although advances in technology are sophisticated, we cannot ensure that it will always work on either your end or our end.  If the connection with a video visit is poor, the visit may have to be switched to a telephone visit.  With either a video or telephone visit, we are not always able to ensure that we have a secure connection.     I need to obtain your verbal consent now.   Are you willing to proceed with your visit today? YES   DARSHA ZUMSTEIN has provided verbal consent on 05/31/2023 for a virtual visit (video or telephone).   Barbara Daphine Deutscher, FNP   Date: 05/31/2023 10:59 AM   Virtual Visit via Video Note   I, Barbara Bennett, connected with JONIKA CRITZ ([id number], 01/17/1956) on 05/31/23 at 12:00 PM EDT by a video-enabled telemedicine application and verified that I am speaking with the correct person using two identifiers.  Location: Patient: Virtual Visit Location Patient: Home Provider: Virtual Visit Location Provider: Mobile   I discussed the  limitations of evaluation and management by telemedicine and the availability of in person appointments. The patient expressed understanding and agreed to proceed.    History of Present Illness: Barbara Bennett is a 67 y.o. who identifies as a female who was assigned female at birth, and is being seen today for infection .  HPI: Patient had a pedicure a week ago and her toes nails were clipped. This past sundy her right big toe has become infected around the nail bed. Red and swollen with no drainage. Rates pain 6/10 and tylenol helps.    Review of Systems  Constitutional:  Negative for diaphoresis and weight loss.  Eyes:  Negative for blurred vision, double vision and pain.  Respiratory:  Negative for shortness of breath.   Cardiovascular:  Negative for chest pain, palpitations, orthopnea and leg swelling.  Gastrointestinal:  Negative for abdominal pain.  Skin:  Negative for rash.  Neurological:  Negative for dizziness, sensory change, loss of consciousness, weakness and headaches.  Endo/Heme/Allergies:  Negative for polydipsia. Does not bruise/bleed easily.  Psychiatric/Behavioral:  Negative for memory loss. The patient does not have insomnia.   All other systems reviewed and are negative.   Problems:  Patient Active Problem List   Diagnosis Date Noted   Syncope and collapse 05/23/2023   Sleep disturbance 01/07/2023   Need for immunization against influenza 09/02/2022   Osteopenia of spine 06/14/2022   CKD (  chronic kidney disease) stage 3, GFR 30-59 ml/min (HCC) 06/03/2022   Acute pain of right knee 04/22/2022   Acute cough 04/22/2022   AKI (acute kidney injury) (HCC) 03/09/2022   Leg swelling 12/01/2021   Anemia, unspecified    Essential (primary) hypertension    Hyperlipidemia, unspecified    Hypothyroidism, unspecified    Obesity, unspecified    Type 2 diabetes mellitus without complications (HCC)     Allergies:  Allergies  Allergen Reactions   Penicillins Rash     Has patient had a PCN reaction causing immediate rash, facial/tongue/throat swelling, SOB or lightheadedness with hypotension: Yes Has patient had a PCN reaction causing severe rash involving mucus membranes or skin necrosis: No Has patient had a PCN reaction that required hospitalization No Has patient had a PCN reaction occurring within the last 10 years: Yes If all of the above answers are "NO", then may proceed with Cephalosporin use.    Sulfa Antibiotics Rash   Medications:  Current Outpatient Medications:    atorvastatin (LIPITOR) 40 MG tablet, Take 1 tablet (40 mg total) by mouth daily., Disp: 90 tablet, Rfl: 3   Calcium Carb-Cholecalciferol (CALTRATE 600+D3) 600-20 MG-MCG TABS, Take 600 mg by mouth 2 (two) times daily., Disp: 60 tablet, Rfl: 3   diclofenac Sodium (VOLTAREN) 1 % GEL, Apply 4 g topically 4 (four) times daily., Disp: 50 g, Rfl: 1   hydrOXYzine (VISTARIL) 25 MG capsule, Take 1 capsule (25 mg total) by mouth at bedtime as needed., Disp: 30 capsule, Rfl: 0   irbesartan (AVAPRO) 150 MG tablet, TAKE 1 TABLET(150 MG) BY MOUTH DAILY, Disp: 30 tablet, Rfl: 2   levothyroxine (SYNTHROID) 88 MCG tablet, Take 1 tablet (88 mcg total) by mouth daily before breakfast., Disp: 90 tablet, Rfl: 3   Semaglutide, 1 MG/DOSE, 4 MG/3ML SOPN, Inject 1 mg as directed once a week. Dx code E11.65, Disp: 9 mL, Rfl: 3  Observations/Objective: Patient is well-developed, well-nourished in no acute distress.  Resting comfortably  at home.  Head is normocephalic, atraumatic.  No labored breathing.  Speech is clear and coherent with logical content.  Patient is alert and oriented at baseline.  Right big toe erythematius and red around lateral nail border  Assessment and Plan:  Macario Golds in today with chief complaint of infection   1. Paronychia of great toe of right foot  Soak in warm epson salt 2 times a day.  If not  improving see  your PCP    Follow Up Instructions: I discussed  the assessment and treatment plan with the patient. The patient was provided an opportunity to ask questions and all were answered. The patient agreed with the plan and demonstrated an understanding of the instructions.  A copy of instructions were sent to the patient via MyChart.  The patient was advised to call back or seek an in-person evaluation if the symptoms worsen or if the condition fails to improve as anticipated.  Time:  I spent 6 minutes with the patient via telehealth technology discussing the above problems/concerns.    Barbara Daphine Deutscher, FNP

## 2023-05-31 NOTE — Patient Instructions (Signed)
  Macario Golds, thank you for joining Bennie Pierini, FNP for today's virtual visit.  While this provider is not your primary care provider (PCP), if your PCP is located in our provider database this encounter information will be shared with them immediately following your visit.   A [facility] MyChart account gives you access to today's visit and all your visits, tests, and labs performed at [facility] " click here if you don't have a [place] MyChart account or go to [email or web address]  Consent: (Patient) Barbara Bennett provided verbal consent for this virtual visit at the beginning of the encounter.  Current Medications:  Current Outpatient Medications:    doxycycline (VIBRA-TABS) 100 MG tablet, Take 1 tablet (100 mg total) by mouth 2 (two) times daily. 1 po bid, Disp: 20 tablet, Rfl: 0   atorvastatin (LIPITOR) 40 MG tablet, Take 1 tablet (40 mg total) by mouth daily., Disp: 90 tablet, Rfl: 3   Calcium Carb-Cholecalciferol (CALTRATE 600+D3) 600-20 MG-MCG TABS, Take 600 mg by mouth 2 (two) times daily., Disp: 60 tablet, Rfl: 3   diclofenac Sodium (VOLTAREN) 1 % GEL, Apply 4 g topically 4 (four) times daily., Disp: 50 g, Rfl: 1   hydrOXYzine (VISTARIL) 25 MG capsule, Take 1 capsule (25 mg total) by mouth at bedtime as needed., Disp: 30 capsule, Rfl: 0   irbesartan (AVAPRO) 150 MG tablet, TAKE 1 TABLET(150 MG) BY MOUTH DAILY, Disp: 30 tablet, Rfl: 2   levothyroxine (SYNTHROID) 88 MCG tablet, Take 1 tablet (88 mcg total) by mouth daily before breakfast., Disp: 90 tablet, Rfl: 3   Semaglutide, 1 MG/DOSE, 4 MG/3ML SOPN, Inject 1 mg as directed once a week. Dx code E11.65, Disp: 9 mL, Rfl: 3   Medications ordered in this encounter:  Meds ordered this encounter  Medications   doxycycline (VIBRA-TABS) 100 MG tablet    Sig: Take 1 tablet (100 mg total) by mouth 2 (two) times daily. 1 po bid    Dispense:  20 tablet    Refill:  0    Order Specific Question:    Supervising Provider    Answer:   Merrilee Jansky [id number]     *If you need refills on other medications prior to your next appointment, please contact your pharmacy*  Follow-Up: Call back or seek an in-person evaluation if the symptoms worsen or if the condition fails to improve as anticipated.  [facility] [phone]  Other Instructions Soak in warm epson salt 2 times a day.  If not  improving see  your PCP   If you have been instructed to have an in-person evaluation today at a local Urgent Care facility, please use the link below. It will take you to a list of all of our available [facility], including address, phone number and hours of operation. Please do not delay care.  [facility]  If you or a family member do not have a primary care provider, use the link below to schedule a visit and establish care. When you choose a [facility] primary care physician or advanced practice provider, you gain a long-term partner in health. Find a Primary Care Provider  Learn more about [facility] in-office and virtual care options: [facility] - Get Care Now

## 2023-06-20 LAB — T4, FREE: Free T4: 1.39 ng/dL (ref 0.82–1.77)

## 2023-06-20 LAB — TSH: TSH: 0.867 u[IU]/mL (ref 0.450–4.500)

## 2023-06-20 NOTE — Patient Instructions (Signed)

## 2023-06-21 ENCOUNTER — Encounter: Payer: Self-pay | Admitting: Nurse Practitioner

## 2023-06-21 ENCOUNTER — Ambulatory Visit (INDEPENDENT_AMBULATORY_CARE_PROVIDER_SITE_OTHER): Payer: Medicare Other | Admitting: Nurse Practitioner

## 2023-06-21 VITALS — BP 122/81 | HR 80 | Ht 60.0 in | Wt 153.6 lb

## 2023-06-21 DIAGNOSIS — E063 Autoimmune thyroiditis: Secondary | ICD-10-CM | POA: Diagnosis not present

## 2023-06-21 DIAGNOSIS — E119 Type 2 diabetes mellitus without complications: Secondary | ICD-10-CM | POA: Diagnosis not present

## 2023-06-21 DIAGNOSIS — Z7985 Long-term (current) use of injectable non-insulin antidiabetic drugs: Secondary | ICD-10-CM

## 2023-06-21 DIAGNOSIS — E038 Other specified hypothyroidism: Secondary | ICD-10-CM

## 2023-06-21 LAB — POCT GLYCOSYLATED HEMOGLOBIN (HGB A1C): Hemoglobin A1C: 5 % (ref 4.0–5.6)

## 2023-06-21 NOTE — Progress Notes (Signed)
Endocrinology Follow Up Note                                         06/21/2023, 11:54 AM  Subjective:   Subjective    Barbara Bennett is a 67 y.o.-year-old female patient being seen in follow up after being seen in consultation for hypothyroidism referred by Gilmore Laroche, FNP.  She was a patient of Dr. Karilyn Cota.   Past Medical History:  Diagnosis Date   Anemia    Anemia, unspecified    Arthritis    knees   Bell's palsy    Hyperlipidemia, unspecified    Hypothyroidism, unspecified    Obesity, unspecified    Type 2 diabetes mellitus without complications (HCC)    no longer on medications, previously on metformin for borderline DM, A1C 5.6    Past Surgical History:  Procedure Laterality Date   CATARACT EXTRACTION W/PHACO Left 03/31/2016   Procedure: CATARACT EXTRACTION PHACO AND INTRAOCULAR LENS PLACEMENT (IOC);  Surgeon: Gemma Payor, MD;  Location: [facility];  Service: Ophthalmology;  Laterality: Left;  CDE: 6.45   CATARACT EXTRACTION W/PHACO Right 04/28/2016   Procedure: CATARACT EXTRACTION PHACO AND INTRAOCULAR LENS PLACEMENT (IOC);  Surgeon: Gemma Payor, MD;  Location: [facility];  Service: Ophthalmology;  Laterality: Right;  CDE 5.00   COLONOSCOPY N/A 12/30/2020   Procedure: COLONOSCOPY;  Surgeon: Corbin Ade, MD;  Location: [facility];  Service: Endoscopy;  Laterality: N/A;  9:30am   KNEE ARTHROSCOPY Left     Social History   Socioeconomic History   Marital status: Married    Spouse name: Not on file   Number of children: 2   Years of education: 12   Highest education level: 12th grade  Occupational History   Occupation: Retired- worked at Clinical biochemist  Tobacco Use   Smoking status: Never   Smokeless tobacco: Never  Vaping Use   Vaping status: Never Used  Substance and Sexual Activity   Alcohol use: No   Drug use: No   Sexual activity: Not Currently  Other Topics Concern   Not on file   Social History Narrative   Married for 26 years,second.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      Son deceased   Daughter lives nearby   Social Determinants of Health   Financial Resource Strain: Low Risk  (04/07/2023)   Overall Financial Resource Strain (CARDIA)    Difficulty of Paying Living Expenses: Not hard at all  Food Insecurity: No Food Insecurity (04/07/2023)   Hunger Vital Sign    Worried About Running Out of Food in the Last Year: Never true    Ran Out of Food in the Last Year: Never true  Transportation Needs: No Transportation Needs (04/07/2023)   PRAPARE - Administrator, Civil Service (Medical): No    Lack of Transportation (Non-Medical): No  Physical Activity: Insufficiently Active (04/07/2023)   Exercise Vital Sign    Days of Exercise per Week: 3 days    Minutes of Exercise  per Session: 20 min  Stress: No Stress Concern Present (04/07/2023)   [employer] of Occupational Health - Occupational Stress Questionnaire    Feeling of Stress : Not at all  Social Connections: Socially Integrated (04/07/2023)   Social Connection and Isolation Panel [NHANES]    Frequency of Communication with Friends and Family: More than three times a week    Frequency of Social Gatherings with Friends and Family: More than three times a week    Attends Religious Services: More than 4 times per year    Active Member of [employer] or Organizations: Yes    Attends Engineer, structural: More than 4 times per year    Marital Status: Married    Family History  Problem Relation Age of Onset   Diabetes Mother    Heart disease Mother    Heart disease Father    Diabetes Sister    Renal Disease Sister        Dialysis   Asthma Sister    Hypertension Sister    Diabetes Sister    Arthritis Sister    Diabetes Sister    Renal Disease Sister        Dialysis   Hypertension Sister    Migraines Daughter    Pneumonia Son    Colon cancer Neg Hx    Sickle cell anemia  Neg Hx    Thalassemia Neg Hx    Leukemia Neg Hx     Outpatient Encounter Medications as of 06/21/2023  Medication Sig   atorvastatin (LIPITOR) 40 MG tablet Take 1 tablet (40 mg total) by mouth daily.   Calcium Carb-Cholecalciferol (CALTRATE 600+D3) 600-20 MG-MCG TABS Take 600 mg by mouth 2 (two) times daily.   diclofenac Sodium (VOLTAREN) 1 % GEL Apply 4 g topically 4 (four) times daily.   hydrOXYzine (VISTARIL) 25 MG capsule Take 1 capsule (25 mg total) by mouth at bedtime as needed.   irbesartan (AVAPRO) 150 MG tablet TAKE 1 TABLET(150 MG) BY MOUTH DAILY   levothyroxine (SYNTHROID) 88 MCG tablet Take 1 tablet (88 mcg total) by mouth daily before breakfast.   Semaglutide, 1 MG/DOSE, 4 MG/3ML SOPN Inject 1 mg as directed once a week. Dx code E11.65   [DISCONTINUED] doxycycline (VIBRA-TABS) 100 MG tablet Take 1 tablet (100 mg total) by mouth 2 (two) times daily. 1 po bid (Patient not taking: Reported on 06/21/2023)   No facility-administered encounter medications on file as of 06/21/2023.    ALLERGIES: Allergies  Allergen Reactions   Penicillins Rash    Has patient had a PCN reaction causing immediate rash, facial/tongue/throat swelling, SOB or lightheadedness with hypotension: Yes Has patient had a PCN reaction causing severe rash involving mucus membranes or skin necrosis: No Has patient had a PCN reaction that required hospitalization No Has patient had a PCN reaction occurring within the last 10 years: Yes If all of the above answers are "NO", then may proceed with Cephalosporin use.    Sulfa Antibiotics Rash   VACCINATION STATUS: Immunization History  Administered Date(s) Administered   Fluad Quad(high Dose 65+) 09/29/2020, 08/24/2021, 09/02/2022   Moderna Sars-Covid-2 Vaccination 01/15/2020, 02/12/2020, 11/09/2020   Pneumococcal Conjugate-13 11/04/2020   Pneumococcal Polysaccharide-23 08/06/2019     Barbara Bennett  is a patient with the above medical history. she was  diagnosed  with hypothyroidism at approximate age of 74 years, which required subsequent initiation of thyroid hormone replacement. she was given various doses of Levothyroxine currently on 88 mcg. she reports compliance to  this medication:  Taking it daily, on an empty stomach at least 30 minutes prior to eating/drinking or taking other medications, waiting 4 hrs before taking Calcium supplement, MVIs, or acid reflux medications.   I reviewed patient's thyroid tests:  Lab Results  Component Value Date   TSH 0.867 06/19/2023   TSH 0.731 01/06/2023   TSH 0.890 12/20/2022   TSH 1.890 09/02/2022   TSH 0.279 (L) 05/19/2022   TSH 0.230 (L) 02/16/2022   TSH 0.267 (L) 10/14/2021   TSH 0.02 (L) 01/05/2021   TSH 5.58 (H) 11/04/2020   TSH 4.43 08/19/2020   FREET4 1.39 06/19/2023   FREET4 1.62 01/06/2023   FREET4 1.53 12/20/2022   FREET4 1.41 09/02/2022   FREET4 1.54 05/19/2022   FREET4 1.56 02/16/2022   FREET4 1.51 10/14/2021   FREET4 1.2 08/19/2020     Pt denies feeling nodules in neck, hoarseness, dysphagia/odynophagia, SOB with lying down.  she denies family history of thyroid disorders.  No family history of thyroid cancer.  No history of radiation therapy to head or neck.  No recent use of iodine supplements.    I reviewed her chart and she also has a history of DM 2, HTN, HLD, IBS.   Review of systems  Constitutional: + decreasing body weight,  current Body mass index is 30 kg/m. , no fatigue, no subjective hyperthermia, no subjective hypothermia Eyes: no blurry vision, no xerophthalmia ENT: no sore throat, no nodules palpated in throat, no dysphagia/odynophagia, no hoarseness Cardiovascular: no chest pain, no shortness of breath, no palpitations, no leg swelling Respiratory: no cough, no shortness of breath Gastrointestinal: no nausea/vomiting/diarrhea Musculoskeletal: no muscle/joint aches Skin: no rashes, no hyperemia Neurological: no tremors, no numbness, no tingling, no  dizziness Psychiatric: no depression, no anxiety   Objective:   Objective     BP 122/81 (BP Location: Left Arm, Patient Position: Sitting, Cuff Size: Normal)   Pulse 80   Ht 5' (1.524 m)   Wt 153 lb 9.6 oz (69.7 kg)   LMP 04/01/2012   BMI 30.00 kg/m  Wt Readings from Last 3 Encounters:  06/21/23 153 lb 9.6 oz (69.7 kg)  05/23/23 155 lb 0.6 oz (70.3 kg)  05/13/23 155 lb (70.3 kg)    BP Readings from Last 3 Encounters:  06/21/23 122/81  05/23/23 114/75  05/13/23 112/86      Physical Exam- Limited  Constitutional:  Body mass index is 30 kg/m. , not in acute distress, normal state of mind Eyes:  EOMI, no exophthalmos Musculoskeletal: no gross deformities, strength intact in all four extremities, no gross restriction of joint movements Skin:  no rashes, no hyperemia Neurological: no tremor with outstretched hands   CMP ( most recent) CMP     Component Value Date/Time   NA 141 05/23/2023 1029   K 4.0 05/23/2023 1029   CL 105 05/23/2023 1029   CO2 22 05/23/2023 1029   GLUCOSE 80 05/23/2023 1029   GLUCOSE 150 (H) 05/13/2023 2038   BUN 16 05/23/2023 1029   CREATININE 0.98 05/23/2023 1029   CREATININE 1.03 (H) 04/05/2021 1420   CALCIUM 9.6 05/23/2023 1029   PROT 7.7 05/13/2023 2038   PROT 6.9 12/20/2022 1030   ALBUMIN 4.0 05/13/2023 2038   ALBUMIN 4.2 12/20/2022 1030   AST 29 05/13/2023 2038   ALT 19 05/13/2023 2038   ALKPHOS 39 05/13/2023 2038   BILITOT 1.1 05/13/2023 2038   BILITOT 0.4 12/20/2022 1030   GFRNONAA 46 (L) 05/13/2023 2038   GFRNONAA 51 (  L) 01/05/2021 1313   GFRAA 59 (L) 01/05/2021 1313     Diabetic Labs (most recent): Lab Results  Component Value Date   HGBA1C 5.0 06/21/2023   HGBA1C 5.2 12/21/2022   HGBA1C 5.6 09/02/2022   MICROALBUR 0.3 08/19/2020   MICROALBUR 0.3 08/15/2017     Lipid Panel ( most recent) Lipid Panel     Component Value Date/Time   CHOL 144 01/06/2023 0918   TRIG 92 01/06/2023 0918   HDL 38 (L) 01/06/2023 0918    CHOLHDL 3.8 01/06/2023 0918   CHOLHDL 3.7 08/19/2020 0818   LDLCALC 89 01/06/2023 0918   LDLCALC 92 08/19/2020 0818   LABVLDL 17 01/06/2023 0918       Lab Results  Component Value Date   TSH 0.867 06/19/2023   TSH 0.731 01/06/2023   TSH 0.890 12/20/2022   TSH 1.890 09/02/2022   TSH 0.279 (L) 05/19/2022   TSH 0.230 (L) 02/16/2022   TSH 0.267 (L) 10/14/2021   TSH 0.02 (L) 01/05/2021   TSH 5.58 (H) 11/04/2020   TSH 4.43 08/19/2020   FREET4 1.39 06/19/2023   FREET4 1.62 01/06/2023   FREET4 1.53 12/20/2022   FREET4 1.41 09/02/2022   FREET4 1.54 05/19/2022   FREET4 1.56 02/16/2022   FREET4 1.51 10/14/2021   FREET4 1.2 08/19/2020     Latest Reference Range & Units 05/19/22 13:56 09/02/22 09:30 12/20/22 10:30 01/06/23 09:18 06/19/23 11:17  TSH 0.450 - 4.500 uIU/mL 0.279 (L) 1.890 0.890 0.731 0.867  T4,Free(Direct) 0.82 - 1.77 ng/dL 1.69 6.78 9.38 1.01 7.51  (L): Data is abnormally low  Assessment & Plan:   ASSESSMENT / PLAN:  1. Hypothyroidism- r/t Hashimoto's thyroiditis  Her antibody tests showed positive Thyroglobulin antibodies helping to classify her thyroid dysfunction as Hashimoto's thyroiditis.  Her previsit TFTs are consistent with appropriate hormone replacement.  She is advised to continue Levothyroxine 88 mcg po daily before breakfast.  - We discussed about correct intake of levothyroxine, at fasting, with water, separated by at least 30 minutes from breakfast, and separated by more than 4 hours from calcium, iron, multivitamins, acid reflux medications (PPIs). -Patient is made aware of the fact that thyroid hormone replacement is needed for life, dose to be adjusted by periodic monitoring of thyroid function tests.   2. Type 2 Diabetes without complications  Her POCT  A1c today is 5%, improving further from last visit of 5.2%.  She denies any problems with Ozempic.    - Nutritional counseling repeated at each appointment due to patients tendency to fall  back in to old habits.  - The patient admits there is a room for improvement in their diet and drink choices. -  Suggestion is made for the patient to avoid simple carbohydrates from their diet including Cakes, Sweet Desserts / Pastries, Ice Cream, Soda (diet and regular), Sweet Tea, Candies, Chips, Cookies, Sweet Pastries, Store Bought Juices, Alcohol in Excess of 1-2 drinks a day, Artificial Sweeteners, Coffee Creamer, and "Sugar-free" Products. This will help patient to have stable blood glucose profile and potentially avoid unintended weight gain.   - I encouraged the patient to switch to unprocessed or minimally processed complex starch and increased protein intake (animal or plant source), fruits, and vegetables.   - Patient is advised to stick to a routine mealtimes to eat 3 meals a day and avoid unnecessary snacks (to snack only to correct hypoglycemia).  She is advised to continue her Ozempic 1 mg SQ weekly.  She does not need to routinely  monitor glucose at this time due to safe medication regimen.     I spent  22  minutes in the care of the patient today including review of labs from CMP, Lipids, Thyroid Function, Hematology (current and previous including abstractions from other facilities); face-to-face time discussing  her blood glucose readings/logs, discussing hypoglycemia and hyperglycemia episodes and symptoms, medications doses, her options of short and long term treatment based on the latest standards of care / guidelines;  discussion about incorporating lifestyle medicine;  and documenting the encounter. Risk reduction counseling performed per USPSTF guidelines to reduce obesity and cardiovascular risk factors.     Please refer to Patient Instructions for Blood Glucose Monitoring and Insulin/Medications Dosing Guide"  in media tab for additional information. Please  also refer to " Patient Self Inventory" in the Media  tab for reviewed elements of pertinent patient  history.  Telford Nab Traub participated in the discussions, expressed understanding, and voiced agreement with the above plans.  All questions were answered to her satisfaction. she is encouraged to contact clinic should she have any questions or concerns prior to her return visit.   FOLLOW UP PLAN:  Return in about 1 year (around 06/20/2024) for Diabetes F/U with A1c in office, Thyroid follow up, Previsit labs.  Ronny Bacon, [facility] [address] [place], [place] [address] Phone: [phone] Fax: [phone]  06/21/2023, 11:54 AM

## 2023-07-12 ENCOUNTER — Ambulatory Visit: Payer: Medicare Other | Admitting: Family Medicine

## 2023-08-02 ENCOUNTER — Ambulatory Visit: Payer: Medicare Other | Admitting: Family Medicine

## 2023-09-01 ENCOUNTER — Ambulatory Visit: Payer: Medicare Other | Admitting: Family Medicine

## 2023-09-27 ENCOUNTER — Ambulatory Visit (INDEPENDENT_AMBULATORY_CARE_PROVIDER_SITE_OTHER): Payer: Medicare Other | Admitting: Family Medicine

## 2023-09-27 VITALS — BP 128/76 | HR 99 | Ht 60.0 in | Wt 146.0 lb

## 2023-09-27 DIAGNOSIS — E559 Vitamin D deficiency, unspecified: Secondary | ICD-10-CM

## 2023-09-27 DIAGNOSIS — E038 Other specified hypothyroidism: Secondary | ICD-10-CM

## 2023-09-27 DIAGNOSIS — I10 Essential (primary) hypertension: Secondary | ICD-10-CM

## 2023-09-27 DIAGNOSIS — E1165 Type 2 diabetes mellitus with hyperglycemia: Secondary | ICD-10-CM

## 2023-09-27 DIAGNOSIS — E7849 Other hyperlipidemia: Secondary | ICD-10-CM | POA: Diagnosis not present

## 2023-09-27 DIAGNOSIS — Z7985 Long-term (current) use of injectable non-insulin antidiabetic drugs: Secondary | ICD-10-CM

## 2023-09-27 DIAGNOSIS — E119 Type 2 diabetes mellitus without complications: Secondary | ICD-10-CM

## 2023-09-27 MED ORDER — ATORVASTATIN CALCIUM 40 MG PO TABS: 40.0000 mg | ORAL_TABLET | Freq: Every day | ORAL | 3 refills | Status: DC

## 2023-09-27 MED ORDER — IRBESARTAN 150 MG PO TABS
150.0000 mg | ORAL_TABLET | Freq: Every day | ORAL | 2 refills | Status: DC
Start: 2023-09-27 — End: 2024-01-03

## 2023-09-27 NOTE — Patient Instructions (Signed)
I appreciate the opportunity to provide care to you today!    Follow up:  4 months  Labs: please stop by the lab today to get your blood drawn (CBC, CMP, TSH, Lipid profile, HgA1c, Vit D)   Attached with your AVS, you will find valuable resources for self-education. I highly recommend dedicating some time to thoroughly examine them.   Please continue to a heart-healthy diet and increase your physical activities. Try to exercise for 30mins at least five days a week.    It was a pleasure to see you and I look forward to continuing to work together on your health and well-being. Please do not hesitate to call the office if you need care or have questions about your care.  In case of emergency, please visit the Emergency Department for urgent care, or contact our clinic at [phone] to schedule an appointment. We're here to help you!   Have a wonderful day and week. With Gratitude, Kylan Veach MSN, FNP-BC  

## 2023-09-27 NOTE — Assessment & Plan Note (Signed)
Encouraged to continue taking Lipitor 40 mg daily Recommend decreasing her intake of greasy, fatty, starchy foods with increase physical activity Lab Results  Component Value Date   CHOL 144 01/06/2023   HDL 38 (L) 01/06/2023   LDLCALC 89 01/06/2023   TRIG 92 01/06/2023   CHOLHDL 3.8 01/06/2023

## 2023-09-27 NOTE — Assessment & Plan Note (Signed)
She denies the 3ps of diabetes She is on Ozempic 1 mg weekly  Encouraged continue following-up with her endocrinologist Recommend to continue decreasing her intake of high sugar foods and beverages with increased physical activity Lab Results  Component Value Date   HGBA1C 5.0 06/21/2023

## 2023-09-27 NOTE — Assessment & Plan Note (Signed)
Controlled Encouraged to continue taking irbesartan 150 mg tablet daily She denies headaches, dizziness, blurred vision, chest pain, palpitation, and shortness of breath Low-sodium diet with increase physical activity encouraged BP Readings from Last 3 Encounters:  09/27/23 128/76  06/21/23 122/81  05/23/23 114/75

## 2023-09-27 NOTE — Assessment & Plan Note (Signed)
She takes Synthroid 88 mcg daily Encouraged to continue treatment regimen Lab Results  Component Value Date   TSH 0.867 06/19/2023

## 2023-09-27 NOTE — Progress Notes (Signed)
Established Patient Office Visit  Subjective:  Patient ID: Barbara Bennett, female    DOB: [birth year]  Age: 67 y.o. MRN: [id number]  CC:  Chief Complaint  Patient presents with   Care Management    Follow up     HPI Barbara Bennett is a 67 y.o. female with past medical history of hypothyroidism, hypertension, hyperlipidemia, type 2 diabetes presents for f/u of  chronic medical conditions.  Hypertension:The patient takes irbesartan 150 mg daily. She reports treatment compliance and is currently asymptomatic in the clinic.  Hypothyroidism:The patient takes Synthroid 88 mcg daily with no reported adverse effects.  Hyperlipidemia:The patient takes atorvastatin 40 mg daily and reports treatment compliance. She denies experiencing any muscle aches or pain.   Past Medical History:  Diagnosis Date   Anemia    Anemia, unspecified    Arthritis    knees   Bell's palsy    Hyperlipidemia, unspecified    Hypothyroidism, unspecified    Obesity, unspecified    Type 2 diabetes mellitus without complications (HCC)    no longer on medications, previously on metformin for borderline DM, A1C 5.6    Past Surgical History:  Procedure Laterality Date   CATARACT EXTRACTION W/PHACO Left 03/31/2016   Procedure: CATARACT EXTRACTION PHACO AND INTRAOCULAR LENS PLACEMENT (IOC);  Surgeon: Gemma Payor, MD;  Location: [facility];  Service: Ophthalmology;  Laterality: Left;  CDE: 6.45   CATARACT EXTRACTION W/PHACO Right 04/28/2016   Procedure: CATARACT EXTRACTION PHACO AND INTRAOCULAR LENS PLACEMENT (IOC);  Surgeon: Gemma Payor, MD;  Location: [facility];  Service: Ophthalmology;  Laterality: Right;  CDE 5.00   COLONOSCOPY N/A 12/30/2020   Procedure: COLONOSCOPY;  Surgeon: Corbin Ade, MD;  Location: [facility];  Service: Endoscopy;  Laterality: N/A;  9:30am   KNEE ARTHROSCOPY Left     Family History  Problem Relation Age of Onset   Diabetes Mother    Heart disease Mother    Heart disease Father     Diabetes Sister    Renal Disease Sister        Dialysis   Asthma Sister    Hypertension Sister    Diabetes Sister    Arthritis Sister    Diabetes Sister    Renal Disease Sister        Dialysis   Hypertension Sister    Migraines Daughter    Pneumonia Son    Colon cancer Neg Hx    Sickle cell anemia Neg Hx    Thalassemia Neg Hx    Leukemia Neg Hx     Social History   Socioeconomic History   Marital status: Married    Spouse name: Not on file   Number of children: 2   Years of education: 12   Highest education level: 12th grade  Occupational History   Occupation: Retired- worked at Clinical biochemist  Tobacco Use   Smoking status: Never   Smokeless tobacco: Never  Vaping Use   Vaping status: Never Used  Substance and Sexual Activity   Alcohol use: No   Drug use: No   Sexual activity: Not Currently  Other Topics Concern   Not on file  Social History Narrative   Married for 26 years,second.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      Son deceased   Daughter lives nearby   Social Determinants of Health   Financial Resource Strain: Low Risk  (09/27/2023)   Overall Financial Resource Strain (CARDIA)    Difficulty of Paying Living Expenses: Not hard  at all  Food Insecurity: No Food Insecurity (09/27/2023)   Hunger Vital Sign    Worried About Running Out of Food in the Last Year: Never true    Ran Out of Food in the Last Year: Never true  Transportation Needs: No Transportation Needs (09/27/2023)   PRAPARE - Administrator, Civil Service (Medical): No    Lack of Transportation (Non-Medical): No  Physical Activity: Unknown (09/27/2023)   Exercise Vital Sign    Days of Exercise per Week: Patient declined    Minutes of Exercise per Session: 20 min  Stress: No Stress Concern Present (09/27/2023)   [employer] of Occupational Health - Occupational Stress Questionnaire    Feeling of Stress : Only a little  Social Connections: Socially  Integrated (09/27/2023)   Social Connection and Isolation Panel [NHANES]    Frequency of Communication with Friends and Family: More than three times a week    Frequency of Social Gatherings with Friends and Family: Three times a week    Attends Religious Services: More than 4 times per year    Active Member of Clubs or Organizations: No    Attends Banker Meetings: More than 4 times per year    Marital Status: Married  Catering manager Violence: Not At Risk (10/04/2021)   Humiliation, Afraid, Rape, and Kick questionnaire    Fear of Current or Ex-Partner: No    Emotionally Abused: No    Physically Abused: No    Sexually Abused: No    Outpatient Medications Prior to Visit  Medication Sig Dispense Refill   Calcium Carb-Cholecalciferol (CALTRATE 600+D3) 600-20 MG-MCG TABS Take 600 mg by mouth 2 (two) times daily. 60 tablet 3   diclofenac Sodium (VOLTAREN) 1 % GEL Apply 4 g topically 4 (four) times daily. 50 g 1   hydrOXYzine (VISTARIL) 25 MG capsule Take 1 capsule (25 mg total) by mouth at bedtime as needed. 30 capsule 0   levothyroxine (SYNTHROID) 88 MCG tablet Take 1 tablet (88 mcg total) by mouth daily before breakfast. 90 tablet 3   Semaglutide, 1 MG/DOSE, 4 MG/3ML SOPN Inject 1 mg as directed once a week. Dx code E11.65 9 mL 3   atorvastatin (LIPITOR) 40 MG tablet Take 1 tablet (40 mg total) by mouth daily. 90 tablet 3   irbesartan (AVAPRO) 150 MG tablet TAKE 1 TABLET(150 MG) BY MOUTH DAILY 30 tablet 2   No facility-administered medications prior to visit.    Allergies  Allergen Reactions   Penicillins Rash    Has patient had a PCN reaction causing immediate rash, facial/tongue/throat swelling, SOB or lightheadedness with hypotension: Yes Has patient had a PCN reaction causing severe rash involving mucus membranes or skin necrosis: No Has patient had a PCN reaction that required hospitalization No Has patient had a PCN reaction occurring within the last 10 years:  Yes If all of the above answers are "NO", then may proceed with Cephalosporin use.    Sulfa Antibiotics Rash    ROS Review of Systems  Constitutional:  Negative for chills and fever.  Eyes:  Negative for visual disturbance.  Respiratory:  Negative for chest tightness and shortness of breath.   Neurological:  Negative for dizziness and headaches.      Objective:    Physical Exam HENT:     Head: Normocephalic.     Mouth/Throat:     Mouth: Mucous membranes are moist.  Cardiovascular:     Rate and Rhythm: Normal rate.  Heart sounds: Normal heart sounds.  Pulmonary:     Effort: Pulmonary effort is normal.     Breath sounds: Normal breath sounds.  Neurological:     Mental Status: She is alert.     BP 128/76   Pulse 99   Ht 5' (1.524 m)   Wt 146 lb (66.2 kg)   LMP 04/01/2012   SpO2 94%   BMI 28.51 kg/m  Wt Readings from Last 3 Encounters:  09/27/23 146 lb (66.2 kg)  06/21/23 153 lb 9.6 oz (69.7 kg)  05/23/23 155 lb 0.6 oz (70.3 kg)    Lab Results  Component Value Date   TSH 0.867 06/19/2023   Lab Results  Component Value Date   WBC 7.6 05/13/2023   HGB 11.6 (L) 05/13/2023   HCT 35.0 (L) 05/13/2023   MCV 83.5 05/13/2023   PLT 157 05/13/2023   Lab Results  Component Value Date   NA 141 05/23/2023   K 4.0 05/23/2023   CO2 22 05/23/2023   GLUCOSE 80 05/23/2023   BUN 16 05/23/2023   CREATININE 0.98 05/23/2023   BILITOT 1.1 05/13/2023   ALKPHOS 39 05/13/2023   AST 29 05/13/2023   ALT 19 05/13/2023   PROT 7.7 05/13/2023   ALBUMIN 4.0 05/13/2023   CALCIUM 9.6 05/23/2023   ANIONGAP 12 05/13/2023   EGFR 62 05/23/2023   Lab Results  Component Value Date   CHOL 144 01/06/2023   Lab Results  Component Value Date   HDL 38 (L) 01/06/2023   Lab Results  Component Value Date   LDLCALC 89 01/06/2023   Lab Results  Component Value Date   TRIG 92 01/06/2023   Lab Results  Component Value Date   CHOLHDL 3.8 01/06/2023   Lab Results  Component  Value Date   HGBA1C 5.0 06/21/2023      Assessment & Plan:  Essential (primary) hypertension Assessment & Plan: Controlled Encouraged to continue taking irbesartan 150 mg tablet daily She denies headaches, dizziness, blurred vision, chest pain, palpitation, and shortness of breath Low-sodium diet with increase physical activity encouraged BP Readings from Last 3 Encounters:  09/27/23 128/76  06/21/23 122/81  05/23/23 114/75    Orders: -     Irbesartan; Take 1 tablet (150 mg total) by mouth daily.  Dispense: 30 tablet; Refill: 2 -     TSH + free T4 -     CMP14+EGFR -     CBC with Differential/Platelet  Other specified hypothyroidism Assessment & Plan: She takes Synthroid 88 mcg daily Encouraged to continue treatment regimen Lab Results  Component Value Date   TSH 0.867 06/19/2023    Orders: -     TSH + free T4  Type 2 diabetes mellitus without complication, without long-term current use of insulin (HCC) Assessment & Plan: She denies the 3ps of diabetes She is on Ozempic 1 mg weekly  Encouraged continue following-up with her endocrinologist Recommend to continue decreasing her intake of high sugar foods and beverages with increased physical activity Lab Results  Component Value Date   HGBA1C 5.0 06/21/2023    Orders: -     Hemoglobin A1c  Other hyperlipidemia Assessment & Plan: Encouraged to continue taking Lipitor 40 mg daily Recommend decreasing her intake of greasy, fatty, starchy foods with increase physical activity Lab Results  Component Value Date   CHOL 144 01/06/2023   HDL 38 (L) 01/06/2023   LDLCALC 89 01/06/2023   TRIG 92 01/06/2023   CHOLHDL 3.8 01/06/2023  Orders: -     Atorvastatin Calcium; Take 1 tablet (40 mg total) by mouth daily.  Dispense: 90 tablet; Refill: 3 -     Lipid panel  Vitamin D deficiency -     VITAMIN D 25 Hydroxy (Vit-D Deficiency, Fractures)  Note: This chart has been completed using Engineer, civil (consulting)  software, and while attempts have been made to ensure accuracy, certain words and phrases may not be transcribed as intended.    Follow-up: Return in about 4 months (around 01/28/2024).   Gilmore Laroche, FNP

## 2023-09-28 LAB — VITAMIN D 25 HYDROXY (VIT D DEFICIENCY, FRACTURES): Vit D, 25-Hydroxy: 45.2 ng/mL (ref 30.0–100.0)

## 2023-09-28 LAB — CBC WITH DIFFERENTIAL/PLATELET
Basophils Absolute: 0.1 10*3/uL (ref 0.0–0.2)
Basos: 1 %
EOS (ABSOLUTE): 0.2 10*3/uL (ref 0.0–0.4)
Eos: 3 %
Hematocrit: 34 % (ref 34.0–46.6)
Hemoglobin: 10.9 g/dL — ABNORMAL LOW (ref 11.1–15.9)
Immature Grans (Abs): 0 10*3/uL (ref 0.0–0.1)
Immature Granulocytes: 0 %
Lymphocytes Absolute: 1.7 10*3/uL (ref 0.7–3.1)
Lymphs: 33 %
MCH: 27.6 pg (ref 26.6–33.0)
MCHC: 32.1 g/dL (ref 31.5–35.7)
MCV: 86 fL (ref 79–97)
Monocytes Absolute: 0.4 10*3/uL (ref 0.1–0.9)
Monocytes: 8 %
Neutrophils Absolute: 2.8 10*3/uL (ref 1.4–7.0)
Neutrophils: 55 %
Platelets: 158 10*3/uL (ref 150–450)
RBC: 3.95 x10E6/uL (ref 3.77–5.28)
RDW: 12.2 % (ref 11.7–15.4)
WBC: 5.2 10*3/uL (ref 3.4–10.8)

## 2023-09-28 LAB — LIPID PANEL
Chol/HDL Ratio: 3.5 ratio (ref 0.0–4.4)
Cholesterol, Total: 152 mg/dL (ref 100–199)
HDL: 44 mg/dL (ref >39)
LDL Chol Calc (NIH): 91 mg/dL (ref 0–99)
Triglycerides: 93 mg/dL (ref 0–149)
VLDL Cholesterol Cal: 17 mg/dL (ref 5–40)

## 2023-09-28 LAB — CMP14+EGFR
ALT: 19 [IU]/L (ref 0–32)
AST: 27 [IU]/L (ref 0–40)
Albumin: 4 g/dL (ref 3.9–4.9)
Alkaline Phosphatase: 50 [IU]/L (ref 44–121)
BUN/Creatinine Ratio: 16 (ref 12–28)
BUN: 17 mg/dL (ref 8–27)
Bilirubin Total: 0.4 mg/dL (ref 0.0–1.2)
CO2: 23 mmol/L (ref 20–29)
Calcium: 9.6 mg/dL (ref 8.7–10.3)
Chloride: 105 mmol/L (ref 96–106)
Creatinine, Ser: 1.09 mg/dL — ABNORMAL HIGH (ref 0.57–1.00)
Globulin, Total: 3 g/dL (ref 1.5–4.5)
Glucose: 87 mg/dL (ref 70–99)
Potassium: 4.1 mmol/L (ref 3.5–5.2)
Sodium: 140 mmol/L (ref 134–144)
Total Protein: 7 g/dL (ref 6.0–8.5)
eGFR: 55 mL/min/{1.73_m2} — ABNORMAL LOW (ref >59)

## 2023-09-28 LAB — TSH+FREE T4
Free T4: 1.61 ng/dL (ref 0.82–1.77)
TSH: 1.25 u[IU]/mL (ref 0.450–4.500)

## 2023-09-28 LAB — HEMOGLOBIN A1C
Est. average glucose Bld gHb Est-mCnc: 103 mg/dL
Hgb A1c MFr Bld: 5.2 % (ref 4.8–5.6)

## 2023-11-22 ENCOUNTER — Ambulatory Visit (INDEPENDENT_AMBULATORY_CARE_PROVIDER_SITE_OTHER): Payer: Medicare Other

## 2023-11-22 VITALS — Ht 60.0 in | Wt 143.0 lb

## 2023-11-22 DIAGNOSIS — Z1159 Encounter for screening for other viral diseases: Secondary | ICD-10-CM

## 2023-11-22 DIAGNOSIS — Z0000 Encounter for general adult medical examination without abnormal findings: Secondary | ICD-10-CM | POA: Diagnosis not present

## 2023-11-22 DIAGNOSIS — Z1231 Encounter for screening mammogram for malignant neoplasm of breast: Secondary | ICD-10-CM

## 2023-11-22 DIAGNOSIS — Z7189 Other specified counseling: Secondary | ICD-10-CM

## 2023-11-22 NOTE — Progress Notes (Signed)
 Because this visit was a virtual/telehealth visit,  certain criteria was not obtained, such a blood pressure, CBG if applicable, and timed get up and go. Any medications not marked as "taking" were not mentioned during the medication reconciliation part of the visit. Any vitals not documented were not able to be obtained due to this being a telehealth visit or patient was unable to self-report a recent blood pressure reading due to a lack of equipment at home via telehealth. Vitals that have been documented are verbally provided by the patient.   Subjective:   Barbara Bennett is a 67 y.o. female who presents for Medicare Annual (Subsequent) preventive examination.  Visit Complete: Virtual I connected with  DONN MASTIN on 11/22/23 by a audio enabled telemedicine application and verified that I am speaking with the correct person using two identifiers.  Patient Location: Home  Provider Location: Home Office  I discussed the limitations of evaluation and management by telemedicine. The patient expressed understanding and agreed to proceed.  Vital Signs: Because this visit was a virtual/telehealth visit, some criteria may be missing or patient reported. Any vitals not documented were not able to be obtained and vitals that have been documented are patient reported.  Patient Medicare AWV questionnaire was completed by the patient on 11/21/2023; I have confirmed that all information answered by patient is correct and no changes since this date.  Cardiac Risk Factors include: advanced age (>45men, >73 women);hypertension;dyslipidemia     Objective:    Today's Vitals   11/21/23 0813 11/22/23 0827  Weight:  143 lb (64.9 kg)  Height:  5' (1.524 m)  PainSc: 0-No pain    Body mass index is 27.93 kg/m.     11/22/2023    8:25 AM 10/12/2022   11:01 AM 10/04/2021    9:48 AM 12/30/2020    8:32 AM 04/28/2016   12:22 PM 03/31/2016    8:29 AM 03/25/2016    1:28 PM  Advanced Directives  Does  Patient Have a Medical Advance Directive? No No No No No No No  Would patient like information on creating a medical advance directive? Yes (MAU/Ambulatory/Procedural Areas - Information given) No - Patient declined No - Patient declined No - Patient declined No - patient declined information No - patient declined information No - patient declined information    Current Medications (verified) Outpatient Encounter Medications as of 11/22/2023  Medication Sig   atorvastatin (LIPITOR) 40 MG tablet Take 1 tablet (40 mg total) by mouth daily.   Calcium Carb-Cholecalciferol (CALTRATE 600+D3) 600-20 MG-MCG TABS Take 600 mg by mouth 2 (two) times daily.   diclofenac Sodium (VOLTAREN) 1 % GEL Apply 4 g topically 4 (four) times daily.   hydrOXYzine (VISTARIL) 25 MG capsule Take 1 capsule (25 mg total) by mouth at bedtime as needed.   irbesartan (AVAPRO) 150 MG tablet Take 1 tablet (150 mg total) by mouth daily.   levothyroxine (SYNTHROID) 88 MCG tablet Take 1 tablet (88 mcg total) by mouth daily before breakfast.   Semaglutide, 1 MG/DOSE, 4 MG/3ML SOPN Inject 1 mg as directed once a week. Dx code E11.65   No facility-administered encounter medications on file as of 11/22/2023.    Allergies (verified) Penicillins and Sulfa antibiotics   History: Past Medical History:  Diagnosis Date   Allergy 2012   Sulfa, penicillin   Anemia    Anemia, unspecified    Arthritis    knees   Bell's palsy    Blood transfusion without reported diagnosis  2010   Because of anemia   Cataract 2017   Removed   Hyperlipidemia, unspecified    Hypertension 2017   Hypothyroidism, unspecified    Obesity, unspecified    Type 2 diabetes mellitus without complications (HCC)    no longer on medications, previously on metformin for borderline DM, A1C 5.6   Past Surgical History:  Procedure Laterality Date   CATARACT EXTRACTION W/PHACO Left 03/31/2016   Procedure: CATARACT EXTRACTION PHACO AND INTRAOCULAR LENS PLACEMENT  (IOC);  Surgeon: Gemma Payor, MD;  Location: [facility];  Service: Ophthalmology;  Laterality: Left;  CDE: 6.45   CATARACT EXTRACTION W/PHACO Right 04/28/2016   Procedure: CATARACT EXTRACTION PHACO AND INTRAOCULAR LENS PLACEMENT (IOC);  Surgeon: Gemma Payor, MD;  Location: [facility];  Service: Ophthalmology;  Laterality: Right;  CDE 5.00   COLONOSCOPY N/A 12/30/2020   Procedure: COLONOSCOPY;  Surgeon: Corbin Ade, MD;  Location: [facility];  Service: Endoscopy;  Laterality: N/A;  9:30am   EYE SURGERY  2017   Cataract surgery in both eyes   FRACTURE SURGERY  2013   Torn Meniscus in left knee   KNEE ARTHROSCOPY Left    Family History  Problem Relation Age of Onset   Diabetes Mother    Heart disease Mother    Heart disease Father    Diabetes Sister    Renal Disease Sister        Dialysis   Asthma Sister    Hypertension Sister    Diabetes Sister    Arthritis Sister    Diabetes Sister    Renal Disease Sister        Dialysis   Hypertension Sister    Migraines Daughter    Pneumonia Son    Arthritis Sister    Kidney disease Sister    Stroke Sister    Colon cancer Neg Hx    Sickle cell anemia Neg Hx    Thalassemia Neg Hx    Leukemia Neg Hx    Social History   Socioeconomic History   Marital status: Married    Spouse name: Not on file   Number of children: 2   Years of education: 12   Highest education level: 12th grade  Occupational History   Occupation: Retired- worked at Clinical biochemist  Tobacco Use   Smoking status: Never   Smokeless tobacco: Never  Vaping Use   Vaping status: Never Used  Substance and Sexual Activity   Alcohol use: No   Drug use: No   Sexual activity: Not Currently    Birth control/protection: Post-menopausal  Other Topics Concern   Not on file  Social History Narrative   Married for 26 years,second.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      Son deceased   Daughter lives nearby   Social Drivers of Health   Financial Resource  Strain: Low Risk  (11/21/2023)   Overall Financial Resource Strain (CARDIA)    Difficulty of Paying Living Expenses: Not hard at all  Food Insecurity: No Food Insecurity (11/21/2023)   Hunger Vital Sign    Worried About Running Out of Food in the Last Year: Never true    Ran Out of Food in the Last Year: Never true  Transportation Needs: No Transportation Needs (11/21/2023)   PRAPARE - Administrator, Civil Service (Medical): No    Lack of Transportation (Non-Medical): No  Physical Activity: Insufficiently Active (11/21/2023)   Exercise Vital Sign    Days of Exercise per Week: 2 days  Minutes of Exercise per Session: 20 min  Stress: No Stress Concern Present (11/21/2023)   [employer] of Occupational Health - Occupational Stress Questionnaire    Feeling of Stress : Only a little  Social Connections: Socially Integrated (11/21/2023)   Social Connection and Isolation Panel [NHANES]    Frequency of Communication with Friends and Family: More than three times a week    Frequency of Social Gatherings with Friends and Family: More than three times a week    Attends Religious Services: More than 4 times per year    Active Member of [employer] or Organizations: Yes    Attends Engineer, structural: More than 4 times per year    Marital Status: Married    Tobacco Counseling Counseling given: Not Answered   Clinical Intake:  Pre-visit preparation completed: Yes  Pain : No/denies pain Pain Score: 0-No pain     BMI - recorded: 27.93 Nutritional Status: BMI 25 -29 Overweight Nutritional Risks: None Diabetes: No  How often do you need to have someone help you when you read instructions, pamphlets, or other written materials from your doctor or pharmacy?: 1 - Never  Interpreter Needed?: No  Information entered by ::  Dvante Hands, CMA   Activities of Daily Living    11/21/2023    8:13 AM  In your present state of health, do you have any difficulty  performing the following activities:  Hearing? 0  Vision? 0  Difficulty concentrating or making decisions? 0  Walking or climbing stairs? 0  Dressing or bathing? 0  Doing errands, shopping? 0  Preparing Food and eating ? N  Using the Toilet? N  In the past six months, have you accidently leaked urine? N  Do you have problems with loss of bowel control? N  Managing your Medications? N  Managing your Finances? N  Housekeeping or managing your Housekeeping? N    Patient Care Team: Gilmore Laroche, FNP as PCP - General (Family Medicine) Jena Gauss Gerrit Friends, MD as Consulting Physician (Gastroenterology) Dani Gobble, NP as Nurse Practitioner (Endocrinology)  Indicate any recent Medical Services you may have received from other than [facility] providers in the past year (date may be approximate).     Assessment:   This is a routine wellness examination for [place].  Hearing/Vision screen Hearing Screening - Comments:: Patient denies any hearing difficulties.   Vision Screening - Comments:: Wears rx glasses - up to date with routine eye exams  [facility] [place]   Goals Addressed             This Visit's Progress    Patient Stated       Remain active and healthy       Depression Screen    09/27/2023    8:44 AM 09/27/2023    8:37 AM 05/23/2023    9:05 AM 04/11/2023    9:55 AM 01/06/2023    8:49 AM 10/12/2022   11:03 AM 09/02/2022    9:05 AM  PHQ 2/9 Scores  PHQ - 2 Score 0 0 0 0 0 0 0  PHQ- 9 Score 2 0 2 0 1      Fall Risk    11/21/2023    8:13 AM 09/27/2023    8:44 AM 09/27/2023    8:37 AM 05/23/2023    9:05 AM 04/11/2023    9:54 AM  Fall Risk   Falls in the past year? 0 0 0 0 0  Number falls in past yr: 0 0 0  0 0  Injury with Fall? 0 0 0 0 0  Risk for fall due to : No Fall Risks No Fall Risks No Fall Risks No Fall Risks No Fall Risks  Follow up Falls prevention discussed Falls evaluation completed Falls evaluation completed Falls evaluation completed Falls  evaluation completed    MEDICARE RISK AT HOME: Medicare Risk at Home Any stairs in or around the home?: Yes If so, are there any without handrails?: No Home free of loose throw rugs in walkways, pet beds, electrical cords, etc?: Yes Adequate lighting in your home to reduce risk of falls?: Yes Life alert?: No Use of a cane, walker or w/c?: No Grab bars in the bathroom?: No Shower chair or bench in shower?: No Elevated toilet seat or a handicapped toilet?: No  TIMED UP AND GO:  Was the test performed?  No    Cognitive Function:    10/04/2021    9:49 AM  MMSE - Mini Mental State Exam  Not completed: Unable to complete        11/22/2023    8:31 AM 10/12/2022   11:03 AM 10/04/2021    9:49 AM  6CIT Screen  What Year? 0 points 0 points 0 points  What month? 0 points 0 points 0 points  What time? 0 points 0 points 0 points  Count back from 20 0 points 0 points 0 points  Months in reverse 0 points 0 points   Repeat phrase 0 points 0 points 10 points  Total Score 0 points 0 points     Immunizations Immunization History  Administered Date(s) Administered   Fluad Quad(high Dose 65+) 09/29/2020, 08/24/2021, 09/02/2022, 09/12/2023   Moderna Sars-Covid-2 Vaccination 01/15/2020, 02/12/2020, 11/09/2020   Pneumococcal Conjugate-13 11/04/2020   Pneumococcal Polysaccharide-23 08/06/2019    TDAP status: Due, Education has been provided regarding the importance of this vaccine. Advised may receive this vaccine at local pharmacy or Health Dept. Aware to provide a copy of the vaccination record if obtained from local pharmacy or Health Dept. Verbalized acceptance and understanding.  Flu Vaccine status: Up to date  Pneumococcal vaccine status: Up to date  Covid-19 vaccine status: Information provided on how to obtain vaccines.   Qualifies for Shingles Vaccine? Yes   Zostavax completed No   Shingrix Completed?: No.    Education has been provided regarding the importance of this  vaccine. Patient has been advised to call insurance company to determine out of pocket expense if they have not yet received this vaccine. Advised may also receive vaccine at local pharmacy or Health Dept. Verbalized acceptance and understanding.  Screening Tests Health Maintenance  Topic Date Due   Hepatitis C Screening  Never done   COVID-19 Vaccine (4 - 2024-25 season) 08/06/2023   Medicare Annual Wellness (AWV)  10/13/2023   Zoster Vaccines- Shingrix (1 of 2) 12/28/2023 (Originally 04/14/2004)   DTaP/Tdap/Td (1 - Tdap) 09/26/2024 (Originally 04/14/1973)   MAMMOGRAM  09/26/2024 (Originally [birth year]14/1972)   Diabetic kidney evaluation - Urine ACR  04/10/2024   DEXA SCAN  06/13/2024   Diabetic kidney evaluation - eGFR measurement  09/26/2024   Colonoscopy  12/30/2030   Pneumonia Vaccine 52+ Years old  Completed   INFLUENZA VACCINE  Completed   HPV VACCINES  Aged Out   FOOT EXAM  Discontinued   HEMOGLOBIN A1C  Discontinued   OPHTHALMOLOGY EXAM  Discontinued    Health Maintenance  Health Maintenance Due  Topic Date Due   Hepatitis C Screening  Never done  COVID-19 Vaccine (4 - 2024-25 season) 08/06/2023   Medicare Annual Wellness (AWV)  10/13/2023    Colorectal cancer screening: Type of screening: Colonoscopy. Completed January 2022. Repeat every 10 years  Mammogram status: Ordered 11/22/2023. Pt provided with contact info and advised to call to schedule appt.   Bone Density status: Completed 06/13/2022. Results reflect: Bone density results: OSTEOPENIA. Repeat every 2 years.  Lung Cancer Screening: (Low Dose CT Chest recommended if Age 79-80 years, 20 pack-year currently smoking OR have quit w/in 15years.) does not qualify.   Lung Cancer Screening Referral: na  Additional Screening:  Hepatitis C Screening: does qualify;Ordered 11/22/2023  Vision Screening: Recommended annual ophthalmology exams for early detection of glaucoma and other disorders of the eye. Is the patient up  to date with their annual eye exam?  Yes  Who is the provider or what is the name of the office in which the patient attends annual eye exams? [facility] [place] If pt is not established with a provider, would they like to be referred to a provider to establish care? No .   Dental Screening: Recommended annual dental exams for proper oral hygiene  Diabetic Foot Exam: na  Community Resource Referral / Chronic Care Management: CRR required this visit?  No   CCM required this visit?  No     Plan:     I have personally reviewed and noted the following in the patient's chart:   Medical and social history Use of alcohol, tobacco or illicit drugs  Current medications and supplements including opioid prescriptions. Patient is not currently taking opioid prescriptions. Functional ability and status Nutritional status Physical activity Advanced directives List of other physicians Hospitalizations, surgeries, and ER visits in previous 12 months Vitals Screenings to include cognitive, depression, and falls Referrals and appointments  In addition, I have reviewed and discussed with patient certain preventive protocols, quality metrics, and best practice recommendations. A written personalized care plan for preventive services as well as general preventive health recommendations were provided to patient.     Jordan Hawks Jakeel Starliper, CMA   11/22/2023   After Visit Summary: (MyChart) Due to this being a telephonic visit, the after visit summary with patients personalized plan was offered to patient via MyChart   Nurse Notes: see routing comment

## 2023-11-22 NOTE — Patient Instructions (Addendum)
Barbara Bennett , Thank you for taking time to come for your Medicare Wellness Visit. I appreciate your ongoing commitment to your health goals. Please review the following plan we discussed and let me know if I can assist you in the future.   Referrals/Orders/Follow-Ups/Clinician Recommendations:  Next Medicare Annual Wellness Visit: November 25, 2024 at 8:00 am virtual (video) visit  You have an order for:  []   2D Mammogram  [x]   3D Mammogram  []   Bone Density   []   Lung Cancer Screening  Please call for appointment:   [facility] [address] [place], [place] [address] [phone]  Make sure to wear two-piece clothing.  No lotions powders or deodorants the day of the appointment Make sure to bring picture ID and insurance card.  Bring list of medications you are currently taking including any supplements.   Schedule your [place] screening mammogram through MyChart!   Log into your MyChart account.  Go to 'Visit' (or 'Appointments' if on mobile App) --> Schedule an Appointment  Under 'Select a Reason for Visit' choose the Mammogram Screening option.  Complete the pre-visit questions and select the time and place that best fits your schedule.     [x]    A Hepatitis C Screening has been ordered for you today. You do not have to fast to have this lab drawn.    This is a list of the screening recommended for you and due dates:  Health Maintenance  Topic Date Due   Hepatitis C Screening  Never done   COVID-19 Vaccine (4 - 2024-25 season) 08/06/2023   Zoster (Shingles) Vaccine (1 of 2) 12/28/2023*   DTaP/Tdap/Td vaccine (1 - Tdap) 09/26/2024*   Mammogram  09/26/2024*   Yearly kidney health urinalysis for diabetes  04/10/2024   DEXA scan (bone density measurement)  06/13/2024   Yearly kidney function blood test for diabetes  09/26/2024   Medicare Annual Wellness Visit  11/21/2024   Colon Cancer Screening  12/30/2030   Pneumonia  Vaccine  Completed   Flu Shot  Completed   HPV Vaccine  Aged Out   Complete foot exam   Discontinued   Hemoglobin A1C  Discontinued   Eye exam for diabetics  Discontinued  *Topic was postponed. The date shown is not the original due date.    Advanced directives: (ACP Link)Information on Advanced Care Planning can be found at [facility] Advance Health Care Directives Advance Health Care Directives ([email or web address]   Next Medicare Annual Wellness Visit scheduled for next year: Yes  Advance Directive  Advance directives are legal documents that allow you to make decisions about your health care and medical treatment in case you become unable to communicate for yourself. Advance directives let your wishes be known to family, friends, and health care providers. Discussing and writing advance directives should happen over time rather than all at once. Advance directives can be changed and updated at any time. There are different types of advance directives, such as: Medical power of attorney. Living will. Do not resuscitate (DNR) order or do not attempt resuscitation (DNAR) order. Health care proxy and medical power of attorney A health care proxy is also called a health care agent. This person is appointed to make medical decisions for you when you are unable to make decisions for yourself. Generally, people ask a trusted friend or family member to act as their proxy and represent their preferences. Make sure you have an agreement  with your trusted person to act as your proxy. A proxy may have to make a medical decision on your behalf if your wishes are not known. A medical power of attorney, also called a durable power of attorney for health care, is a legal document that names your health care proxy. Depending on the laws in your state, the document may need to be: Signed. Notarized. Dated. Copied. Witnessed. Incorporated into your medical record. You may also want to  appoint a trusted person to manage your money in the event you are unable to do so. This is called a durable power of attorney for finances. It is a separate legal document from the durable power of attorney for health care. You may choose your health care proxy or someone different to act as your agent in money matters. If you do not appoint a proxy, or there is a concern that the proxy is not acting in your best interest, a court may appoint a guardian to act on your behalf. Living will A living will is a set of instructions that state your wishes about medical care when you cannot express them yourself. Health care providers should keep a copy of your living will in your medical record. You may want to give a copy to family members or friends. To alert caregivers in case of an emergency, you can place a card in your wallet to let them know that you have a living will and where they can find it. A living will is used if you become: Terminally ill. Disabled. Unable to communicate or make decisions. The following decisions should be included in your living will: To use or not to use life support equipment, such as dialysis machines and breathing machines (ventilators). Whether you want a DNR or DNAR order. This tells health care providers not to use cardiopulmonary resuscitation (CPR) if breathing or heartbeat stops. To use or not to use tube feeding. To be given or not to be given food and fluids. Whether you want comfort (palliative) care when the goal becomes comfort rather than a cure. Whether you want to donate your organs and tissues. A living will does not give instructions for distributing your money and property if you should pass away. DNR or DNAR A DNR or DNAR order is a request not to have CPR in the event that your heart stops beating or you stop breathing. If a DNR or DNAR order has not been made and shared, a health care provider will try to help any patient whose heart has stopped or  who has stopped breathing. If you plan to have surgery, talk with your health care provider about how your DNR or DNAR order will be followed if problems occur. What if I do not have an advance directive? Some states assign family decision makers to act on your behalf if you do not have an advance directive. Each state has its own laws about advance directives. You may want to check with your health care provider, attorney, or state representative about the laws in your state. Summary Advance directives are legal documents that allow you to make decisions about your health care and medical treatment in case you become unable to communicate for yourself. The process of discussing and writing advance directives should happen over time. You can change and update advance directives at any time. Advance directives may include a medical power of attorney, a living will, and a DNR or DNAR order. This information is not intended to  replace advice given to you by your health care provider. Make sure you discuss any questions you have with your health care provider. Document Revised: 08/25/2020 Document Reviewed: 08/25/2020 Elsevier Patient Education  2024 [employer]. Understanding Your Risk for Falls Millions of people have serious injuries from falls each year. It is important to understand your risk of falling. Talk with your health care provider about your risk and what you can do to lower it. If you do have a serious fall, make sure to tell your provider. Falling once raises your risk of falling again. How can falls affect me? Serious injuries from falls are common. These include: Broken bones, such as hip fractures. Head injuries, such as traumatic brain injuries (TBI) or concussions. A fear of falling can cause you to avoid activities and stay at home. This can make your muscles weaker and raise your risk for a fall. What can increase my risk? There are a number of risk factors that increase your  risk for falling. The more risk factors you have, the higher your risk of falling. Serious injuries from a fall happen most often to people who are older than 69 years old. Teenagers and young adults ages 30-29 are also at higher risk. Common risk factors include: Weakness in the lower body. Being generally weak or confused due to long-term (chronic) illness. Dizziness or balance problems. Poor vision. Medicines that cause dizziness or drowsiness. These may include: Medicines for your blood pressure, heart, anxiety, insomnia, or swelling (edema). Pain medicines. Muscle relaxants. Other risk factors include: Drinking alcohol. Having had a fall in the past. Having foot pain or wearing improper footwear. Working at a dangerous job. Having any of the following in your home: Tripping hazards, such as floor clutter or loose rugs. Poor lighting. Pets. Having dementia or memory loss. What actions can I take to lower my risk of falling?     Physical activity Stay physically fit. Do strength and balance exercises. Consider taking a regular class to build strength and balance. Yoga and tai chi are good options. Vision Have your eyes checked every year and your prescription for glasses or contacts updated as needed. Shoes and walking aids Wear non-skid shoes. Wear shoes that have rubber soles and low heels. Do not wear high heels. Do not walk around the house in socks or slippers. Use a cane or walker as told by your provider. Home safety Attach secure railings on both sides of your stairs. Install grab bars for your bathtub, shower, and toilet. Use a non-skid mat in your bathtub or shower. Attach bath mats securely with double-sided, non-slip rug tape. Use good lighting in all rooms. Keep a flashlight near your bed. Make sure there is a clear path from your bed to the bathroom. Use night-lights. Do not use throw rugs. Make sure all carpeting is taped or tacked down securely. Remove all  clutter from walkways and stairways, including extension cords. Repair uneven or broken steps and floors. Avoid walking on icy or slippery surfaces. Walk on the grass instead of on icy or slick sidewalks. Use ice melter to get rid of ice on walkways in the winter. Use a cordless phone. Questions to ask your health care provider Can you help me check my risk for a fall? Do any of my medicines make me more likely to fall? Should I take a vitamin D supplement? What exercises can I do to improve my strength and balance? Should I make an appointment to have my vision  checked? Do I need a bone density test to check for weak bones (osteoporosis)? Would it help to use a cane or a walker? Where to find more information Centers for Disease Control and Prevention, STEADI: TonerPromos.no Community-Based Fall Prevention Programs: TonerPromos.no [employer] on Aging: BaseRingTones.pl Contact a health care provider if: You fall at home. You are afraid of falling at home. You feel weak, drowsy, or dizzy. This information is not intended to replace advice given to you by your health care provider. Make sure you discuss any questions you have with your health care provider. Document Revised: 07/25/2022 Document Reviewed: 07/25/2022 Elsevier Patient Education  2024 [employer]. Preventive Care 65 Years and Older, Female Preventive care refers to lifestyle choices and visits with your health care provider that can promote health and wellness. Preventive care visits are also called wellness exams. What can I expect for my preventive care visit? Counseling Your health care provider may ask you questions about your: Medical history, including: Past medical problems. Family medical history. Pregnancy and menstrual history. History of falls. Current health, including: Memory and ability to understand (cognition). Emotional well-being. Home life and relationship well-being. Sexual activity and sexual  health. Lifestyle, including: Alcohol, nicotine or tobacco, and drug use. Access to firearms. Diet, exercise, and sleep habits. Work and work Astronomer. Sunscreen use. Safety issues such as seatbelt and bike helmet use. Physical exam Your health care provider will check your: Height and weight. These may be used to calculate your BMI (body mass index). BMI is a measurement that tells if you are at a healthy weight. Waist circumference. This measures the distance around your waistline. This measurement also tells if you are at a healthy weight and may help predict your risk of certain diseases, such as type 2 diabetes and high blood pressure. Heart rate and blood pressure. Body temperature. Skin for abnormal spots. What immunizations do I need?  Vaccines are usually given at various ages, according to a schedule. Your health care provider will recommend vaccines for you based on your age, medical history, and lifestyle or other factors, such as travel or where you work. What tests do I need? Screening Your health care provider may recommend screening tests for certain conditions. This may include: Lipid and cholesterol levels. Hepatitis C test. Hepatitis B test. HIV (human immunodeficiency virus) test. STI (sexually transmitted infection) testing, if you are at risk. Lung cancer screening. Colorectal cancer screening. Diabetes screening. This is done by checking your blood sugar (glucose) after you have not eaten for a while (fasting). Mammogram. Talk with your health care provider about how often you should have regular mammograms. BRCA-related cancer screening. This may be done if you have a family history of breast, ovarian, tubal, or peritoneal cancers. Bone density scan. This is done to screen for osteoporosis. Talk with your health care provider about your test results, treatment options, and if necessary, the need for more tests. Follow these instructions at home: Eating and  drinking  Eat a diet that includes fresh fruits and vegetables, whole grains, lean protein, and low-fat dairy products. Limit your intake of foods with high amounts of sugar, saturated fats, and salt. Take vitamin and mineral supplements as recommended by your health care provider. Do not drink alcohol if your health care provider tells you not to drink. If you drink alcohol: Limit how much you have to 0-1 drink a day. Know how much alcohol is in your drink. In the [place] one drink equals one 12 oz bottle  of beer (355 mL), one 5 oz glass of wine (148 mL), or one 1 oz glass of hard liquor (44 mL). Lifestyle Brush your teeth every morning and night with fluoride toothpaste. Floss one time each day. Exercise for at least 30 minutes 5 or more days each week. Do not use any products that contain nicotine or tobacco. These products include cigarettes, chewing tobacco, and vaping devices, such as e-cigarettes. If you need help quitting, ask your health care provider. Do not use drugs. If you are sexually active, practice safe sex. Use a condom or other form of protection in order to prevent STIs. Take aspirin only as told by your health care provider. Make sure that you understand how much to take and what form to take. Work with your health care provider to find out whether it is safe and beneficial for you to take aspirin daily. Ask your health care provider if you need to take a cholesterol-lowering medicine (statin). Find healthy ways to manage stress, such as: Meditation, yoga, or listening to music. Journaling. Talking to a trusted person. Spending time with friends and family. Minimize exposure to UV radiation to reduce your risk of skin cancer. Safety Always wear your seat belt while driving or riding in a vehicle. Do not drive: If you have been drinking alcohol. Do not ride with someone who has been drinking. When you are tired or distracted. While texting. If you have been using any  mind-altering substances or drugs. Wear a helmet and other protective equipment during sports activities. If you have firearms in your house, make sure you follow all gun safety procedures. What's next? Visit your health care provider once a year for an annual wellness visit. Ask your health care provider how often you should have your eyes and teeth checked. Stay up to date on all vaccines. This information is not intended to replace advice given to you by your health care provider. Make sure you discuss any questions you have with your health care provider. Document Revised: 05/19/2021 Document Reviewed: 05/19/2021 Elsevier Patient Education  2024 [employer].

## 2023-12-27 ENCOUNTER — Other Ambulatory Visit: Payer: Self-pay | Admitting: *Deleted

## 2023-12-27 DIAGNOSIS — E119 Type 2 diabetes mellitus without complications: Secondary | ICD-10-CM

## 2023-12-27 DIAGNOSIS — E039 Hypothyroidism, unspecified: Secondary | ICD-10-CM

## 2023-12-27 DIAGNOSIS — E063 Autoimmune thyroiditis: Secondary | ICD-10-CM

## 2023-12-27 DIAGNOSIS — Z7985 Long-term (current) use of injectable non-insulin antidiabetic drugs: Secondary | ICD-10-CM

## 2023-12-27 MED ORDER — SEMAGLUTIDE (1 MG/DOSE) 4 MG/3ML ~~LOC~~ SOPN
1.0000 mg | PEN_INJECTOR | SUBCUTANEOUS | 3 refills | Status: DC
Start: 2023-12-27 — End: 2024-06-20

## 2023-12-27 MED ORDER — LEVOTHYROXINE SODIUM 88 MCG PO TABS
88.0000 ug | ORAL_TABLET | Freq: Every day | ORAL | 3 refills | Status: DC
Start: 2023-12-27 — End: 2024-06-20

## 2024-01-02 ENCOUNTER — Other Ambulatory Visit: Payer: Self-pay | Admitting: Family Medicine

## 2024-01-02 DIAGNOSIS — I10 Essential (primary) hypertension: Secondary | ICD-10-CM

## 2024-01-29 ENCOUNTER — Ambulatory Visit (INDEPENDENT_AMBULATORY_CARE_PROVIDER_SITE_OTHER): Payer: Medicare Other | Admitting: Family Medicine

## 2024-01-29 VITALS — BP 117/76 | HR 84 | Ht 60.0 in | Wt 141.0 lb

## 2024-01-29 DIAGNOSIS — E038 Other specified hypothyroidism: Secondary | ICD-10-CM

## 2024-01-29 DIAGNOSIS — I10 Essential (primary) hypertension: Secondary | ICD-10-CM | POA: Diagnosis not present

## 2024-01-29 DIAGNOSIS — G479 Sleep disorder, unspecified: Secondary | ICD-10-CM | POA: Diagnosis not present

## 2024-01-29 DIAGNOSIS — E559 Vitamin D deficiency, unspecified: Secondary | ICD-10-CM

## 2024-01-29 DIAGNOSIS — E7849 Other hyperlipidemia: Secondary | ICD-10-CM

## 2024-01-29 DIAGNOSIS — E119 Type 2 diabetes mellitus without complications: Secondary | ICD-10-CM | POA: Diagnosis not present

## 2024-01-29 DIAGNOSIS — R7301 Impaired fasting glucose: Secondary | ICD-10-CM

## 2024-01-29 DIAGNOSIS — Z7985 Long-term (current) use of injectable non-insulin antidiabetic drugs: Secondary | ICD-10-CM

## 2024-01-29 MED ORDER — IRBESARTAN 150 MG PO TABS: 150.0000 mg | ORAL_TABLET | Freq: Every day | ORAL | 1 refills | Status: DC

## 2024-01-29 MED ORDER — ATORVASTATIN CALCIUM 40 MG PO TABS: 40.0000 mg | ORAL_TABLET | Freq: Every day | ORAL | 1 refills | Status: DC

## 2024-01-29 MED ORDER — HYDROXYZINE PAMOATE 25 MG PO CAPS
25.0000 mg | ORAL_CAPSULE | Freq: Every evening | ORAL | 0 refills | Status: AC | PRN
Start: 2024-01-29 — End: ?

## 2024-01-29 NOTE — Assessment & Plan Note (Signed)
 She denies the 3ps of diabetes She is on Ozempic 1 mg weekly  Encouraged continue following-up with her endocrinologist Recommend to continue decreasing her intake of high sugar foods and beverages with increased physical activity Lab Results  Component Value Date   HGBA1C 5.2 09/27/2023

## 2024-01-29 NOTE — Progress Notes (Signed)
 Established Patient Office Visit  Subjective:  Patient ID: Barbara Bennett, female    DOB: 06-May-1956  Age: 68 y.o. MRN: [id number]  CC:  Chief Complaint  Patient presents with   Follow-up    4 month    HPI Barbara Bennett is a 68 y.o. female with past medical history of type II diabetes, hypertension, hypothyroidism presents for f/u of  chronic medical conditions. For the details of today's visit, please refer to the assessment and plan.     Past Medical History:  Diagnosis Date   Allergy 2012   Sulfa, penicillin   Anemia    Anemia, unspecified    Arthritis    knees   Bell's palsy    Blood transfusion without reported diagnosis 2010   Because of anemia   Cataract 2017   Removed   Hyperlipidemia, unspecified    Hypertension 2017   Hypothyroidism, unspecified    Obesity, unspecified    Type 2 diabetes mellitus without complications (HCC)    no longer on medications, previously on metformin for borderline DM, A1C 5.6    Past Surgical History:  Procedure Laterality Date   CATARACT EXTRACTION W/PHACO Left 03/31/2016   Procedure: CATARACT EXTRACTION PHACO AND INTRAOCULAR LENS PLACEMENT (IOC);  Surgeon: Gemma Payor, MD;  Location: [facility];  Service: Ophthalmology;  Laterality: Left;  CDE: 6.45   CATARACT EXTRACTION W/PHACO Right 04/28/2016   Procedure: CATARACT EXTRACTION PHACO AND INTRAOCULAR LENS PLACEMENT (IOC);  Surgeon: Gemma Payor, MD;  Location: [facility];  Service: Ophthalmology;  Laterality: Right;  CDE 5.00   COLONOSCOPY N/A 12/30/2020   Procedure: COLONOSCOPY;  Surgeon: Corbin Ade, MD;  Location: [facility];  Service: Endoscopy;  Laterality: N/A;  9:30am   EYE SURGERY  2017   Cataract surgery in both eyes   FRACTURE SURGERY  2013   Torn Meniscus in left knee   KNEE ARTHROSCOPY Left     Family History  Problem Relation Age of Onset   Diabetes Mother    Heart disease Mother    Heart disease Father    Diabetes Sister    Renal Disease Sister         Dialysis   Asthma Sister    Hypertension Sister    Diabetes Sister    Arthritis Sister    Diabetes Sister    Renal Disease Sister        Dialysis   Hypertension Sister    Migraines Daughter    Pneumonia Son    Arthritis Sister    Kidney disease Sister    Stroke Sister    Colon cancer Neg Hx    Sickle cell anemia Neg Hx    Thalassemia Neg Hx    Leukemia Neg Hx     Social History   Socioeconomic History   Marital status: Married    Spouse name: Not on file   Number of children: 2   Years of education: 12   Highest education level: 12th grade  Occupational History   Occupation: Retired- worked at Clinical biochemist  Tobacco Use   Smoking status: Never   Smokeless tobacco: Never  Vaping Use   Vaping status: Never Used  Substance and Sexual Activity   Alcohol use: No   Drug use: No   Sexual activity: Not Currently    Birth control/protection: Post-menopausal  Other Topics Concern   Not on file  Social History Narrative   Married for 26 years,second.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  Son deceased   Daughter lives nearby   Social Drivers of Health   Financial Resource Strain: Low Risk  (01/29/2024)   Overall Financial Resource Strain (CARDIA)    Difficulty of Paying Living Expenses: Not hard at all  Food Insecurity: No Food Insecurity (01/29/2024)   Hunger Vital Sign    Worried About Running Out of Food in the Last Year: Never true    Ran Out of Food in the Last Year: Never true  Transportation Needs: No Transportation Needs (01/29/2024)   PRAPARE - Administrator, Civil Service (Medical): No    Lack of Transportation (Non-Medical): No  Physical Activity: Insufficiently Active (01/29/2024)   Exercise Vital Sign    Days of Exercise per Week: 3 days    Minutes of Exercise per Session: 10 min  Stress: Stress Concern Present (01/29/2024)   [employer] of Occupational Health - Occupational Stress Questionnaire    Feeling of Stress :  To some extent  Social Connections: Socially Integrated (01/29/2024)   Social Connection and Isolation Panel [NHANES]    Frequency of Communication with Friends and Family: More than three times a week    Frequency of Social Gatherings with Friends and Family: More than three times a week    Attends Religious Services: More than 4 times per year    Active Member of [employer] or Organizations: Yes    Attends Banker Meetings: 1 to 4 times per year    Marital Status: Married  Catering manager Violence: Not At Risk (11/22/2023)   Humiliation, Afraid, Rape, and Kick questionnaire    Fear of Current or Ex-Partner: No    Emotionally Abused: No    Physically Abused: No    Sexually Abused: No    Outpatient Medications Prior to Visit  Medication Sig Dispense Refill   Calcium Carb-Cholecalciferol (CALTRATE 600+D3) 600-20 MG-MCG TABS Take 600 mg by mouth 2 (two) times daily. 60 tablet 3   diclofenac Sodium (VOLTAREN) 1 % GEL Apply 4 g topically 4 (four) times daily. 50 g 1   levothyroxine (SYNTHROID) 88 MCG tablet Take 1 tablet (88 mcg total) by mouth daily before breakfast. 90 tablet 3   Semaglutide, 1 MG/DOSE, 4 MG/3ML SOPN Inject 1 mg as directed once a week. Dx code E11.65 9 mL 3   atorvastatin (LIPITOR) 40 MG tablet Take 1 tablet (40 mg total) by mouth daily. 90 tablet 3   irbesartan (AVAPRO) 150 MG tablet TAKE 1 TABLET(150 MG) BY MOUTH DAILY 30 tablet 2   hydrOXYzine (VISTARIL) 25 MG capsule Take 1 capsule (25 mg total) by mouth at bedtime as needed. (Patient not taking: Reported on 01/29/2024) 30 capsule 0   No facility-administered medications prior to visit.    Allergies  Allergen Reactions   Penicillins Rash    Has patient had a PCN reaction causing immediate rash, facial/tongue/throat swelling, SOB or lightheadedness with hypotension: Yes Has patient had a PCN reaction causing severe rash involving mucus membranes or skin necrosis: No Has patient had a PCN reaction that  required hospitalization No Has patient had a PCN reaction occurring within the last 10 years: Yes If all of the above answers are "NO", then may proceed with Cephalosporin use.    Sulfa Antibiotics Rash    ROS Review of Systems  Constitutional:  Negative for chills and fever.  Eyes:  Negative for visual disturbance.  Respiratory:  Negative for chest tightness and shortness of breath.   Neurological:  Negative for dizziness and  headaches.      Objective:    Physical Exam HENT:     Head: Normocephalic.     Mouth/Throat:     Mouth: Mucous membranes are moist.  Cardiovascular:     Rate and Rhythm: Normal rate.     Heart sounds: Normal heart sounds.  Pulmonary:     Effort: Pulmonary effort is normal.     Breath sounds: Normal breath sounds.  Neurological:     Mental Status: She is alert.     BP 117/76   Pulse 84   Ht 5' (1.524 m)   Wt 141 lb 0.2 oz (64 kg)   LMP 04/01/2012   SpO2 97%   BMI 27.54 kg/m  Wt Readings from Last 3 Encounters:  01/29/24 141 lb 0.2 oz (64 kg)  11/22/23 143 lb (64.9 kg)  09/27/23 146 lb (66.2 kg)    Lab Results  Component Value Date   TSH 1.250 09/27/2023   Lab Results  Component Value Date   WBC 5.2 09/27/2023   HGB 10.9 (L) 09/27/2023   HCT 34.0 09/27/2023   MCV 86 09/27/2023   PLT 158 09/27/2023   Lab Results  Component Value Date   NA 140 09/27/2023   K 4.1 09/27/2023   CO2 23 09/27/2023   GLUCOSE 87 09/27/2023   BUN 17 09/27/2023   CREATININE 1.09 (H) 09/27/2023   BILITOT 0.4 09/27/2023   ALKPHOS 50 09/27/2023   AST 27 09/27/2023   ALT 19 09/27/2023   PROT 7.0 09/27/2023   ALBUMIN 4.0 09/27/2023   CALCIUM 9.6 09/27/2023   ANIONGAP 12 05/13/2023   EGFR 55 (L) 09/27/2023   Lab Results  Component Value Date   CHOL 152 09/27/2023   Lab Results  Component Value Date   HDL 44 09/27/2023   Lab Results  Component Value Date   LDLCALC 91 09/27/2023   Lab Results  Component Value Date   TRIG 93 09/27/2023    Lab Results  Component Value Date   CHOLHDL 3.5 09/27/2023   Lab Results  Component Value Date   HGBA1C 5.2 09/27/2023      Assessment & Plan:  Essential (primary) hypertension Assessment & Plan: Controlled Encouraged to continue taking irbesartan 150 mg tablet daily She denies headaches, dizziness, blurred vision, chest pain, palpitation, and shortness of breath Low-sodium diet with increase physical activity encouraged BP Readings from Last 3 Encounters:  01/29/24 117/76  09/27/23 128/76  06/21/23 122/81    Orders: -     Irbesartan; Take 1 tablet (150 mg total) by mouth daily.  Dispense: 90 tablet; Refill: 1  Other specified hypothyroidism Assessment & Plan: She takes Synthroid 88 mcg daily The patient is encouraged to follow up if experiencing symptoms of hypothyroidism, despite compliance with the current treatment regimen, such as fatigue, weight gain, constipation, dry skin, cold intolerance, hair loss, or depression.   Lab Results  Component Value Date   TSH 1.250 09/27/2023     Type 2 diabetes mellitus without complication, without long-term current use of insulin (HCC) Assessment & Plan: She denies the 3ps of diabetes She is on Ozempic 1 mg weekly  Encouraged continue following-up with her endocrinologist Recommend to continue decreasing her intake of high sugar foods and beverages with increased physical activity Lab Results  Component Value Date   HGBA1C 5.2 09/27/2023     Sleep disturbances -     hydrOXYzine Pamoate; Take 1 capsule (25 mg total) by mouth at bedtime as needed.  Dispense: 30  capsule; Refill: 0  Other hyperlipidemia -     Atorvastatin Calcium; Take 1 tablet (40 mg total) by mouth daily.  Dispense: 90 tablet; Refill: 1  IFG (impaired fasting glucose) -     Hemoglobin A1c  Vitamin D deficiency -     VITAMIN D 25 Hydroxy (Vit-D Deficiency, Fractures)  TSH (thyroid-stimulating hormone deficiency) -     TSH + free T4 -     Lipid  panel -     CMP14+EGFR -     CBC with Differential/Platelet  Sleep disturbance Assessment & Plan: Refill sent   Note: This chart has been completed using Engineer, civil (consulting) software, and while attempts have been made to ensure accuracy, certain words and phrases may not be transcribed as intended.    Follow-up: Return in about 4 months (around 05/28/2024).   Gilmore Laroche, FNP

## 2024-01-29 NOTE — Assessment & Plan Note (Signed)
 Controlled Encouraged to continue taking irbesartan 150 mg tablet daily She denies headaches, dizziness, blurred vision, chest pain, palpitation, and shortness of breath Low-sodium diet with increase physical activity encouraged BP Readings from Last 3 Encounters:  01/29/24 117/76  09/27/23 128/76  06/21/23 122/81

## 2024-01-29 NOTE — Assessment & Plan Note (Signed)
 She takes Synthroid 88 mcg daily The patient is encouraged to follow up if experiencing symptoms of hypothyroidism, despite compliance with the current treatment regimen, such as fatigue, weight gain, constipation, dry skin, cold intolerance, hair loss, or depression.   Lab Results  Component Value Date   TSH 1.250 09/27/2023

## 2024-01-29 NOTE — Patient Instructions (Signed)
I appreciate the opportunity to provide care to you today!    Follow up:  4 months  Labs: please stop by the lab today to get your blood drawn (CBC, CMP, TSH, Lipid profile, HgA1c, Vit D)   Here are some foods to avoid or reduce in your diet to help manage cholesterol levels:  Fried Foods:Deep-fried items such as french fries, fried chicken, and fried snacks are high in unhealthy fats and can raise LDL (bad) cholesterol levels. Processed Meats:Foods like bacon, sausage, hot dogs, and deli meats are often high in saturated fat and cholesterol. Full-Fat Dairy Products:Whole milk, full-fat yogurt, butter, cream, and cheese are rich in saturated fats, which can increase cholesterol levels. Baked Goods and Sweets:Pastries, cakes, cookies, and donuts often contain trans fats and added sugars, which can raise LDL cholesterol and lower HDL (good) cholesterol. Red Meat:Beef, lamb, and pork are high in saturated fat. Lean cuts or plant-based protein alternatives are better options. Lard and Shortening:Used in some baked goods, lard and shortening are high in trans fats and should be avoided. Fast Food:Many fast food items are cooked with unhealthy oils and contain high amounts of saturated and trans fats. Processed Snacks:Chips, crackers, and certain microwave popcorns can contain trans fats and high levels of unhealthy oils. Shellfish:While nutritious in other ways, some shellfish like shrimp, lobster, and crab are high in cholesterol. They should be consumed in moderation. Coconut and Palm Oils:these oils are high in saturated fat and can raise cholesterol levels when used in cooking or baking.       Please continue to a heart-healthy diet and increase your physical activities. Try to exercise for at least five days a week.    It was a pleasure to see you and I look forward to continuing to work together on your health and well-being. Please do not hesitate to call the office if you need  care or have questions about your care.  In case of emergency, please visit the Emergency Department for urgent care, or contact our clinic at [phone] to schedule an appointment. We're here to help you!   Have a wonderful day and week. With Gratitude, Gilmore Laroche MSN, FNP-BC

## 2024-01-29 NOTE — Assessment & Plan Note (Signed)
 Refill sent.

## 2024-01-30 LAB — CBC WITH DIFFERENTIAL/PLATELET
Basophils Absolute: 0.1 10*3/uL (ref 0.0–0.2)
Basos: 1 %
EOS (ABSOLUTE): 0.2 10*3/uL (ref 0.0–0.4)
Eos: 4 %
Hematocrit: 33.4 % — ABNORMAL LOW (ref 34.0–46.6)
Hemoglobin: 10.8 g/dL — ABNORMAL LOW (ref 11.1–15.9)
Immature Grans (Abs): 0 10*3/uL (ref 0.0–0.1)
Immature Granulocytes: 0 %
Lymphocytes Absolute: 1.9 10*3/uL (ref 0.7–3.1)
Lymphs: 33 %
MCH: 27.7 pg (ref 26.6–33.0)
MCHC: 32.3 g/dL (ref 31.5–35.7)
MCV: 86 fL (ref 79–97)
Monocytes Absolute: 0.4 10*3/uL (ref 0.1–0.9)
Monocytes: 7 %
Neutrophils Absolute: 3.2 10*3/uL (ref 1.4–7.0)
Neutrophils: 55 %
Platelets: 160 10*3/uL (ref 150–450)
RBC: 3.9 x10E6/uL (ref 3.77–5.28)
RDW: 12.1 % (ref 11.7–15.4)
WBC: 5.8 10*3/uL (ref 3.4–10.8)

## 2024-01-30 LAB — LIPID PANEL
Chol/HDL Ratio: 3.2 ratio (ref 0.0–4.4)
Cholesterol, Total: 155 mg/dL (ref 100–199)
HDL: 49 mg/dL (ref >39)
LDL Chol Calc (NIH): 91 mg/dL (ref 0–99)
Triglycerides: 81 mg/dL (ref 0–149)
VLDL Cholesterol Cal: 15 mg/dL (ref 5–40)

## 2024-01-30 LAB — CMP14+EGFR
ALT: 36 IU/L — ABNORMAL HIGH (ref 0–32)
AST: 37 IU/L (ref 0–40)
Albumin: 3.9 g/dL (ref 3.9–4.9)
Alkaline Phosphatase: 61 IU/L (ref 44–121)
BUN/Creatinine Ratio: 14 (ref 12–28)
BUN: 16 mg/dL (ref 8–27)
Bilirubin Total: 0.4 mg/dL (ref 0.0–1.2)
CO2: 21 mmol/L (ref 20–29)
Calcium: 9.1 mg/dL (ref 8.7–10.3)
Chloride: 104 mmol/L (ref 96–106)
Creatinine, Ser: 1.17 mg/dL — ABNORMAL HIGH (ref 0.57–1.00)
Globulin, Total: 3.1 g/dL (ref 1.5–4.5)
Glucose: 87 mg/dL (ref 70–99)
Potassium: 3.9 mmol/L (ref 3.5–5.2)
Sodium: 139 mmol/L (ref 134–144)
Total Protein: 7 g/dL (ref 6.0–8.5)
eGFR: 51 mL/min/{1.73_m2} — ABNORMAL LOW (ref >59)

## 2024-01-30 LAB — HEMOGLOBIN A1C
Est. average glucose Bld gHb Est-mCnc: 97 mg/dL
Hgb A1c MFr Bld: 5 % (ref 4.8–5.6)

## 2024-01-30 LAB — VITAMIN D 25 HYDROXY (VIT D DEFICIENCY, FRACTURES): Vit D, 25-Hydroxy: 39.8 ng/mL (ref 30.0–100.0)

## 2024-01-30 LAB — TSH+FREE T4
Free T4: 1.7 ng/dL (ref 0.82–1.77)
TSH: 0.275 u[IU]/mL — ABNORMAL LOW (ref 0.450–4.500)

## 2024-02-01 ENCOUNTER — Other Ambulatory Visit: Payer: Self-pay | Admitting: Family Medicine

## 2024-02-01 ENCOUNTER — Encounter: Payer: Self-pay | Admitting: Family Medicine

## 2024-02-01 DIAGNOSIS — E063 Autoimmune thyroiditis: Secondary | ICD-10-CM

## 2024-02-05 ENCOUNTER — Telehealth: Payer: Self-pay | Admitting: *Deleted

## 2024-02-05 NOTE — Telephone Encounter (Signed)
 Patient called and left a message with the after hours nurse on 02/02/24. She has a question about lab results. Patient was called and a message was left asking that she call out office back so that we may help her.

## 2024-05-23 ENCOUNTER — Ambulatory Visit

## 2024-05-23 DIAGNOSIS — E119 Type 2 diabetes mellitus without complications: Secondary | ICD-10-CM

## 2024-05-23 DIAGNOSIS — E118 Type 2 diabetes mellitus with unspecified complications: Secondary | ICD-10-CM

## 2024-05-23 LAB — HM DIABETES EYE EXAM

## 2024-05-23 NOTE — Progress Notes (Signed)
 Arrived 05/23/2024 and has given verbal consent to obtain images and complete their overdue diabetic retinal screening.  The images have been sent to an ophthalmologist or optometrist for review and interpretation.  Results will be sent back to [facility] for review.  Patient has been informed they will be contacted when we receive the results via telephone or MyChart.

## 2024-05-27 ENCOUNTER — Ambulatory Visit: Payer: Medicare Other | Admitting: Family Medicine

## 2024-06-19 LAB — COMPREHENSIVE METABOLIC PANEL WITH GFR
ALT: 27 IU/L (ref 0–32)
AST: 35 IU/L (ref 0–40)
Albumin: 3.8 g/dL — ABNORMAL LOW (ref 3.9–4.9)
Alkaline Phosphatase: 50 IU/L (ref 44–121)
BUN/Creatinine Ratio: 14 (ref 12–28)
BUN: 13 mg/dL (ref 8–27)
Bilirubin Total: 0.6 mg/dL (ref 0.0–1.2)
CO2: 22 mmol/L (ref 20–29)
Calcium: 8.8 mg/dL (ref 8.7–10.3)
Chloride: 105 mmol/L (ref 96–106)
Creatinine, Ser: 0.94 mg/dL (ref 0.57–1.00)
Globulin, Total: 3 g/dL (ref 1.5–4.5)
Glucose: 72 mg/dL (ref 70–99)
Potassium: 4.1 mmol/L (ref 3.5–5.2)
Sodium: 141 mmol/L (ref 134–144)
Total Protein: 6.8 g/dL (ref 6.0–8.5)
eGFR: 65 mL/min/1.73 (ref >59)

## 2024-06-19 LAB — T4, FREE: Free T4: 1.3 ng/dL (ref 0.82–1.77)

## 2024-06-19 LAB — TSH: TSH: 0.986 u[IU]/mL (ref 0.450–4.500)

## 2024-06-20 ENCOUNTER — Encounter: Payer: Self-pay | Admitting: Nurse Practitioner

## 2024-06-20 ENCOUNTER — Ambulatory Visit (INDEPENDENT_AMBULATORY_CARE_PROVIDER_SITE_OTHER): Payer: Medicare Other | Admitting: Nurse Practitioner

## 2024-06-20 VITALS — BP 104/66 | HR 88 | Ht 60.0 in | Wt 144.8 lb

## 2024-06-20 DIAGNOSIS — E119 Type 2 diabetes mellitus without complications: Secondary | ICD-10-CM | POA: Diagnosis not present

## 2024-06-20 DIAGNOSIS — Z7985 Long-term (current) use of injectable non-insulin antidiabetic drugs: Secondary | ICD-10-CM | POA: Diagnosis not present

## 2024-06-20 DIAGNOSIS — E063 Autoimmune thyroiditis: Secondary | ICD-10-CM

## 2024-06-20 DIAGNOSIS — E039 Hypothyroidism, unspecified: Secondary | ICD-10-CM

## 2024-06-20 LAB — POCT GLYCOSYLATED HEMOGLOBIN (HGB A1C): Hemoglobin A1C: 5.1 % (ref 4.0–5.6)

## 2024-06-20 MED ORDER — SEMAGLUTIDE (1 MG/DOSE) 4 MG/3ML ~~LOC~~ SOPN
1.0000 mg | PEN_INJECTOR | SUBCUTANEOUS | 3 refills | Status: AC
Start: 2024-06-20 — End: ?

## 2024-06-20 MED ORDER — LEVOTHYROXINE SODIUM 88 MCG PO TABS
88.0000 ug | ORAL_TABLET | Freq: Every day | ORAL | 3 refills | Status: AC
Start: 2024-06-20 — End: ?

## 2024-06-20 NOTE — Progress Notes (Signed)
 Endocrinology Follow Up Note                                         06/20/2024, 11:24 AM  Subjective:   Subjective    Barbara Bennett is a 68 y.o.-year-old female patient being seen in follow up after being seen in consultation for hypothyroidism referred by Zarwolo, Gloria, FNP.  She was a patient of Dr. Caswell.   Past Medical History:  Diagnosis Date   Allergy 2012   Sulfa, penicillin   Anemia    Anemia, unspecified    Arthritis    knees   Bell's palsy    Blood transfusion without reported diagnosis 2010   Because of anemia   Cataract 2017   Removed   Hyperlipidemia, unspecified    Hypertension 2017   Hypothyroidism, unspecified    Obesity, unspecified    Type 2 diabetes mellitus without complications (HCC)    no longer on medications, previously on metformin  for borderline DM, A1C 5.6    Past Surgical History:  Procedure Laterality Date   CATARACT EXTRACTION W/PHACO Left 03/31/2016   Procedure: CATARACT EXTRACTION PHACO AND INTRAOCULAR LENS PLACEMENT (IOC);  Surgeon: Cherene Mania, MD;  Location: [facility];  Service: Ophthalmology;  Laterality: Left;  CDE: 6.45   CATARACT EXTRACTION W/PHACO Right 04/28/2016   Procedure: CATARACT EXTRACTION PHACO AND INTRAOCULAR LENS PLACEMENT (IOC);  Surgeon: Cherene Mania, MD;  Location: [facility];  Service: Ophthalmology;  Laterality: Right;  CDE 5.00   COLONOSCOPY N/A 12/30/2020   Procedure: COLONOSCOPY;  Surgeon: Shaaron Lamar CHRISTELLA, MD;  Location: [facility];  Service: Endoscopy;  Laterality: N/A;  9:30am   EYE SURGERY  2017   Cataract surgery in both eyes   FRACTURE SURGERY  2013   Torn Meniscus in left knee   KNEE ARTHROSCOPY Left     Social History   Socioeconomic History   Marital status: Married    Spouse name: Not on file   Number of children: 2   Years of education: 12   Highest education level: 12th grade  Occupational History   Occupation: Retired-  worked at Clinical biochemist  Tobacco Use   Smoking status: Never   Smokeless tobacco: Never  Vaping Use   Vaping status: Never Used  Substance and Sexual Activity   Alcohol use: No   Drug use: No   Sexual activity: Not Currently    Birth control/protection: Post-menopausal  Other Topics Concern   Not on file  Social History Narrative   Married for 26 years,second.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      Son deceased   Daughter lives nearby   Social Drivers of Health   Financial Resource Strain: Low Risk  (05/27/2024)   Overall Financial Resource Strain (CARDIA)    Difficulty of Paying Living Expenses: Not hard at all  Food Insecurity: No Food Insecurity (05/27/2024)   Hunger Vital Sign    Worried About Running Out of Food in the Last Year: Never true    Ran  Out of Food in the Last Year: Never true  Transportation Needs: No Transportation Needs (05/27/2024)   PRAPARE - Administrator, Civil Service (Medical): No    Lack of Transportation (Non-Medical): No  Physical Activity: Insufficiently Active (05/27/2024)   Exercise Vital Sign    Days of Exercise per Week: 1 day    Minutes of Exercise per Session: 10 min  Stress: No Stress Concern Present (05/27/2024)   [employer] of Occupational Health - Occupational Stress Questionnaire    Feeling of Stress: Only a little  Social Connections: Socially Integrated (05/27/2024)   Social Connection and Isolation Panel    Frequency of Communication with Friends and Family: More than three times a week    Frequency of Social Gatherings with Friends and Family: More than three times a week    Attends Religious Services: More than 4 times per year    Active Member of Clubs or Organizations: Yes    Attends Engineer, structural: More than 4 times per year    Marital Status: Married    Family History  Problem Relation Age of Onset   Diabetes Mother    Heart disease Mother    Heart disease Father     Diabetes Sister    Renal Disease Sister        Dialysis   Asthma Sister    Hypertension Sister    Diabetes Sister    Arthritis Sister    Diabetes Sister    Renal Disease Sister        Dialysis   Hypertension Sister    Migraines Daughter    Pneumonia Son    Arthritis Sister    Kidney disease Sister    Stroke Sister    Colon cancer Neg Hx    Sickle cell anemia Neg Hx    Thalassemia Neg Hx    Leukemia Neg Hx     Outpatient Encounter Medications as of 06/20/2024  Medication Sig   atorvastatin  (LIPITOR) 40 MG tablet Take 1 tablet (40 mg total) by mouth daily.   Calcium  Carb-Cholecalciferol  (CALTRATE 600+D3) 600-20 MG-MCG TABS Take 600 mg by mouth 2 (two) times daily.   diclofenac  Sodium (VOLTAREN ) 1 % GEL Apply 4 g topically 4 (four) times daily.   hydrOXYzine  (VISTARIL ) 25 MG capsule Take 1 capsule (25 mg total) by mouth at bedtime as needed.   irbesartan  (AVAPRO ) 150 MG tablet Take 1 tablet (150 mg total) by mouth daily.   [DISCONTINUED] levothyroxine  (SYNTHROID ) 88 MCG tablet Take 1 tablet (88 mcg total) by mouth daily before breakfast.   [DISCONTINUED] Semaglutide , 1 MG/DOSE, 4 MG/3ML SOPN Inject 1 mg as directed once a week. Dx code E11.65   levothyroxine  (SYNTHROID ) 88 MCG tablet Take 1 tablet (88 mcg total) by mouth daily before breakfast.   Semaglutide , 1 MG/DOSE, 4 MG/3ML SOPN Inject 1 mg as directed once a week. Dx code E11.65   No facility-administered encounter medications on file as of 06/20/2024.    ALLERGIES: Allergies  Allergen Reactions   Penicillins Rash    Has patient had a PCN reaction causing immediate rash, facial/tongue/throat swelling, SOB or lightheadedness with hypotension: Yes Has patient had a PCN reaction causing severe rash involving mucus membranes or skin necrosis: No Has patient had a PCN reaction that required hospitalization No Has patient had a PCN reaction occurring within the last 10 years: Yes If all of the above answers are NO, then may  proceed with Cephalosporin use.    Sulfa  Antibiotics Rash   VACCINATION STATUS: Immunization History  Administered Date(s) Administered   Fluad Quad(high Dose 65+) 09/29/2020, 08/24/2021, 09/02/2022, 09/12/2023   Influenza, High Dose Seasonal PF 09/11/2023   Influenza-Unspecified 09/11/1996, 09/30/1997, 09/16/1998   Moderna Sars-Covid-2 Vaccination 01/15/2020, 02/12/2020, 11/09/2020   Pneumococcal Conjugate-13 11/04/2020   Pneumococcal Polysaccharide-23 09/30/1997, 08/06/2019     Barbara CHRISTELLA Bihari  is a patient with the above medical history. she was diagnosed  with hypothyroidism at approximate age of 43 years, which required subsequent initiation of thyroid  hormone replacement. she was given various doses of Levothyroxine  currently on 88 mcg. she reports compliance to this medication:  Taking it daily, on an empty stomach at least 30 minutes prior to eating/drinking or taking other medications, waiting 4 hrs before taking Calcium  supplement, MVIs, or acid reflux medications.   I reviewed patient's thyroid  tests:  Lab Results  Component Value Date   TSH 0.986 06/18/2024   TSH 0.275 (L) 01/29/2024   TSH 1.250 09/27/2023   TSH 0.867 06/19/2023   TSH 0.731 01/06/2023   TSH 0.890 12/20/2022   TSH 1.890 09/02/2022   TSH 0.279 (L) 05/19/2022   TSH 0.230 (L) 02/16/2022   TSH 0.267 (L) 10/14/2021   FREET4 1.30 06/18/2024   FREET4 1.70 01/29/2024   FREET4 1.61 09/27/2023   FREET4 1.39 06/19/2023   FREET4 1.62 01/06/2023   FREET4 1.53 12/20/2022   FREET4 1.41 09/02/2022   FREET4 1.54 05/19/2022   FREET4 1.56 02/16/2022   FREET4 1.51 10/14/2021     Pt denies feeling nodules in neck, hoarseness, dysphagia/odynophagia, SOB with lying down.  she denies family history of thyroid  disorders.  No family history of thyroid  cancer.  No history of radiation therapy to head or neck.  No recent use of iodine  supplements.    I reviewed her chart and she also has a history of DM 2, HTN, HLD,  IBS.   Review of systems  Constitutional: + Minimally fluctuating body weight,  current Body mass index is 28.28 kg/m. , no fatigue, no subjective hyperthermia, no subjective hypothermia Eyes: no blurry vision, no xerophthalmia ENT: no sore throat, no nodules palpated in throat, no dysphagia/odynophagia, no hoarseness Cardiovascular: no chest pain, no shortness of breath, no palpitations, no leg swelling Respiratory: no cough, no shortness of breath Gastrointestinal: no nausea/vomiting/diarrhea Musculoskeletal: no muscle/joint aches Skin: no rashes, no hyperemia Neurological: no tremors, no numbness, no tingling, no dizziness Psychiatric: no depression, no anxiety   Objective:   Objective     BP 104/66 (BP Location: Left Arm, Patient Position: Sitting, Cuff Size: Large)   Pulse 88   Ht 5' (1.524 m)   Wt 144 lb 12.8 oz (65.7 kg)   LMP 04/01/2012   BMI 28.28 kg/m  Wt Readings from Last 3 Encounters:  06/20/24 144 lb 12.8 oz (65.7 kg)  01/29/24 141 lb 0.2 oz (64 kg)  11/22/23 143 lb (64.9 kg)    BP Readings from Last 3 Encounters:  06/20/24 104/66  01/29/24 117/76  09/27/23 128/76      Physical Exam- Limited  Constitutional:  Body mass index is 28.28 kg/m. , not in acute distress, normal state of mind Eyes:  EOMI, no exophthalmos Musculoskeletal: no gross deformities, strength intact in all four extremities, no gross restriction of joint movements Skin:  no rashes, no hyperemia Neurological: no tremor with outstretched hands   CMP ( most recent) CMP     Component Value Date/Time   NA 141 06/18/2024 1226   K 4.1 06/18/2024  1226   CL 105 06/18/2024 1226   CO2 22 06/18/2024 1226   GLUCOSE 72 06/18/2024 1226   GLUCOSE 150 (H) 05/13/2023 2038   BUN 13 06/18/2024 1226   CREATININE 0.94 06/18/2024 1226   CREATININE 1.03 (H) 04/05/2021 1420   CALCIUM  8.8 06/18/2024 1226   PROT 6.8 06/18/2024 1226   ALBUMIN 3.8 (L) 06/18/2024 1226   AST 35 06/18/2024 1226    ALT 27 06/18/2024 1226   ALKPHOS 50 06/18/2024 1226   BILITOT 0.6 06/18/2024 1226   GFRNONAA 46 (L) 05/13/2023 2038   GFRNONAA 51 (L) 01/05/2021 1313   GFRAA 59 (L) 01/05/2021 1313     Diabetic Labs (most recent): Lab Results  Component Value Date   HGBA1C 5.1 06/20/2024   HGBA1C 5.0 01/29/2024   HGBA1C 5.2 09/27/2023   MICROALBUR 0.3 08/19/2020   MICROALBUR 0.3 08/15/2017     Lipid Panel ( most recent) Lipid Panel     Component Value Date/Time   CHOL 155 01/29/2024 0919   TRIG 81 01/29/2024 0919   HDL 49 01/29/2024 0919   CHOLHDL 3.2 01/29/2024 0919   CHOLHDL 3.7 08/19/2020 0818   LDLCALC 91 01/29/2024 0919   LDLCALC 92 08/19/2020 0818   LABVLDL 15 01/29/2024 0919       Lab Results  Component Value Date   TSH 0.986 06/18/2024   TSH 0.275 (L) 01/29/2024   TSH 1.250 09/27/2023   TSH 0.867 06/19/2023   TSH 0.731 01/06/2023   TSH 0.890 12/20/2022   TSH 1.890 09/02/2022   TSH 0.279 (L) 05/19/2022   TSH 0.230 (L) 02/16/2022   TSH 0.267 (L) 10/14/2021   FREET4 1.30 06/18/2024   FREET4 1.70 01/29/2024   FREET4 1.61 09/27/2023   FREET4 1.39 06/19/2023   FREET4 1.62 01/06/2023   FREET4 1.53 12/20/2022   FREET4 1.41 09/02/2022   FREET4 1.54 05/19/2022   FREET4 1.56 02/16/2022   FREET4 1.51 10/14/2021     Latest Reference Range & Units 09/27/23 09:18 01/29/24 09:19 06/18/24 12:26  TSH 0.450 - 4.500 uIU/mL 1.250 0.275 (L) 0.986  T4,Free(Direct) 0.82 - 1.77 ng/dL 8.38 8.29 8.69  (L): Data is abnormally low  Assessment & Plan:   ASSESSMENT / PLAN:  1. Hypothyroidism- r/t Hashimoto's thyroiditis  Her antibody tests showed positive Thyroglobulin antibodies helping to classify her thyroid  dysfunction as Hashimoto's thyroiditis.  Her previsit TFTs are consistent with appropriate hormone replacement.  She is advised to continue Levothyroxine  88 mcg po daily before breakfast.  - We discussed about correct intake of levothyroxine , at fasting, with water , separated  by at least 30 minutes from breakfast, and separated by more than 4 hours from calcium , iron, multivitamins, acid reflux medications (PPIs). -Patient is made aware of the fact that thyroid  hormone replacement is needed for life, dose to be adjusted by periodic monitoring of thyroid  function tests.   2. Type 2 Diabetes without complications  Her POCT  A1c today is 5.1% essentially unchanged from previous visit.  She denies any problems with Ozempic .    - Nutritional counseling repeated at each appointment due to patients tendency to fall back in to old habits.  - The patient admits there is a room for improvement in their diet and drink choices. -  Suggestion is made for the patient to avoid simple carbohydrates from their diet including Cakes, Sweet Desserts / Pastries, Ice Cream, Soda (diet and regular), Sweet Tea, Candies, Chips, Cookies, Sweet Pastries, Store Bought Juices, Alcohol in Excess of 1-2 drinks a day,  Artificial Sweeteners, Coffee Creamer, and Sugar-free Products. This will help patient to have stable blood glucose profile and potentially avoid unintended weight gain.   - I encouraged the patient to switch to unprocessed or minimally processed complex starch and increased protein intake (animal or plant source), fruits, and vegetables.   - Patient is advised to stick to a routine mealtimes to eat 3 meals a day and avoid unnecessary snacks (to snack only to correct hypoglycemia).  She is advised to continue her Ozempic  1 mg SQ weekly.  She does not need to routinely monitor glucose at this time due to safe medication regimen.      I spent  20  minutes in the care of the patient today including review of labs from CMP, Lipids, Thyroid  Function, Hematology (current and previous including abstractions from other facilities); face-to-face time discussing  her blood glucose readings/logs, discussing hypoglycemia and hyperglycemia episodes and symptoms, medications doses, her  options of short and long term treatment based on the latest standards of care / guidelines;  discussion about incorporating lifestyle medicine;  and documenting the encounter. Risk reduction counseling performed per USPSTF guidelines to reduce obesity and cardiovascular risk factors.     Please refer to Patient Instructions for Blood Glucose Monitoring and Insulin/Medications Dosing Guide  in media tab for additional information. Please  also refer to  Patient Self Inventory in the Media  tab for reviewed elements of pertinent patient history.  Barbara Bennett participated in the discussions, expressed understanding, and voiced agreement with the above plans.  All questions were answered to her satisfaction. she is encouraged to contact clinic should she have any questions or concerns prior to her return visit.   FOLLOW UP PLAN:  Return in about 1 year (around 06/20/2025) for Thyroid  follow up, Diabetes F/U with A1c in office, Previsit labs.  Benton Rio, [facility] [address] [place], [place] [address] Phone: [phone] Fax: [phone]  06/20/2024, 11:24 AM

## 2024-06-20 NOTE — Patient Instructions (Signed)

## 2024-07-25 ENCOUNTER — Encounter: Payer: Self-pay | Admitting: Family Medicine

## 2024-07-25 ENCOUNTER — Ambulatory Visit (INDEPENDENT_AMBULATORY_CARE_PROVIDER_SITE_OTHER): Admitting: Family Medicine

## 2024-07-25 VITALS — BP 135/78 | HR 80 | Ht 60.0 in | Wt 144.0 lb

## 2024-07-25 DIAGNOSIS — E038 Other specified hypothyroidism: Secondary | ICD-10-CM | POA: Diagnosis not present

## 2024-07-25 DIAGNOSIS — I10 Essential (primary) hypertension: Secondary | ICD-10-CM | POA: Diagnosis not present

## 2024-07-25 DIAGNOSIS — E119 Type 2 diabetes mellitus without complications: Secondary | ICD-10-CM | POA: Diagnosis not present

## 2024-07-25 DIAGNOSIS — Z7985 Long-term (current) use of injectable non-insulin antidiabetic drugs: Secondary | ICD-10-CM | POA: Diagnosis not present

## 2024-07-25 NOTE — Assessment & Plan Note (Signed)
 She takes Synthroid  88 mcg daily The patient is encouraged to follow up if experiencing symptoms of hypothyroidism, despite compliance with the current treatment regimen, such as fatigue, weight gain, constipation, dry skin, cold intolerance, hair loss, or depression.   Lab Results  Component Value Date   TSH 0.986 06/18/2024

## 2024-07-25 NOTE — Assessment & Plan Note (Signed)
 She denies the 3ps of diabetes She is on Ozempic  1 mg weekly  Encouraged continue following-up with her endocrinologist Recommend to continue decreasing her intake of high sugar foods and beverages with increased physical activity Lab Results  Component Value Date   HGBA1C 5.1 06/20/2024

## 2024-07-25 NOTE — Patient Instructions (Signed)
 I appreciate the opportunity to provide care to you today!    Follow up:  5 months  Labs: next  visit  For a Healthier YOU, I Recommend: Reducing your intake of sugar, sodium, carbohydrates, and saturated fats. Increasing your fiber intake by incorporating more whole grains, fruits, and vegetables into your meals. Setting healthy goals with a focus on lowering your consumption of carbs, sugar, and unhealthy fats. Adding variety to your diet by including a wide range of fruits and vegetables. Cutting back on soda and limiting processed foods as much as possible. Staying active: In addition to taking your weight loss medication, aim for at least 150 minutes of moderate-intensity physical activity each week for optimal results.    Please follow up if your symptoms worsen or fail to improve.    Please continue to a heart-healthy diet and increase your physical activities. Try to exercise for at least five days a week.    It was a pleasure to see you and I look forward to continuing to work together on your health and well-being. Please do not hesitate to call the office if you need care or have questions about your care.  In case of emergency, please visit the Emergency Department for urgent care, or contact our clinic at [phone] to schedule an appointment. We're here to help you!   Have a wonderful day and week. With Gratitude, Lakeita Panther MSN, FNP-BC

## 2024-07-25 NOTE — Assessment & Plan Note (Signed)
 Controlled Encouraged to continue taking irbesartan  150 mg tablet daily She denies headaches, dizziness, blurred vision, chest pain, palpitation, and shortness of breath Low-sodium diet with increase physical activity encouraged BP Readings from Last 3 Encounters:  07/25/24 135/78  06/20/24 104/66  01/29/24 117/76

## 2024-07-25 NOTE — Progress Notes (Signed)
 Established Patient Office Visit  Subjective:  Patient ID: Barbara Bennett, female    DOB: 03-Jul-1956  Age: 68 y.o. MRN: [id number]  CC:  Chief Complaint  Patient presents with   Hypertension    HPI Barbara Bennett is a 68 y.o. female with past medical history of hypertension, type II diabetes, hypothyroidism presents for f/u of  chronic medical conditions. For the details of today's visit, please refer to the assessment and plan.     Past Medical History:  Diagnosis Date   Allergy 2012   Sulfa, penicillin   Anemia    Anemia, unspecified    Arthritis    knees   Bell's palsy    Blood transfusion without reported diagnosis 2010   Because of anemia   Cataract 2017   Removed   Hyperlipidemia, unspecified    Hypertension 2017   Hypothyroidism, unspecified    Obesity, unspecified    Type 2 diabetes mellitus without complications (HCC)    no longer on medications, previously on metformin  for borderline DM, A1C 5.6    Past Surgical History:  Procedure Laterality Date   CATARACT EXTRACTION W/PHACO Left 03/31/2016   Procedure: CATARACT EXTRACTION PHACO AND INTRAOCULAR LENS PLACEMENT (IOC);  Surgeon: Cherene Mania, MD;  Location: [facility];  Service: Ophthalmology;  Laterality: Left;  CDE: 6.45   CATARACT EXTRACTION W/PHACO Right 04/28/2016   Procedure: CATARACT EXTRACTION PHACO AND INTRAOCULAR LENS PLACEMENT (IOC);  Surgeon: Cherene Mania, MD;  Location: [facility];  Service: Ophthalmology;  Laterality: Right;  CDE 5.00   COLONOSCOPY N/A 12/30/2020   Procedure: COLONOSCOPY;  Surgeon: Shaaron Lamar CHRISTELLA, MD;  Location: [facility];  Service: Endoscopy;  Laterality: N/A;  9:30am   EYE SURGERY  2017   Cataract surgery in both eyes   FRACTURE SURGERY  2013   Torn Meniscus in left knee   KNEE ARTHROSCOPY Left     Family History  Problem Relation Age of Onset   Diabetes Mother    Heart disease Mother    Heart disease Father    Diabetes Sister    Renal Disease Sister        Dialysis    Asthma Sister    Hypertension Sister    Diabetes Sister    Arthritis Sister    Diabetes Sister    Renal Disease Sister        Dialysis   Hypertension Sister    Migraines Daughter    Pneumonia Son    Arthritis Sister    Kidney disease Sister    Stroke Sister    Colon cancer Neg Hx    Sickle cell anemia Neg Hx    Thalassemia Neg Hx    Leukemia Neg Hx     Social History   Socioeconomic History   Marital status: Married    Spouse name: Not on file   Number of children: 2   Years of education: 12   Highest education level: 12th grade  Occupational History   Occupation: Retired- worked at Clinical biochemist  Tobacco Use   Smoking status: Never   Smokeless tobacco: Never  Vaping Use   Vaping status: Never Used  Substance and Sexual Activity   Alcohol use: No   Drug use: No   Sexual activity: Not Currently    Birth control/protection: Post-menopausal  Other Topics Concern   Not on file  Social History Narrative   Married for 26 years,second.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      Son deceased  Daughter lives nearby   Social Drivers of Health   Financial Resource Strain: Low Risk  (07/22/2024)   Overall Financial Resource Strain (CARDIA)    Difficulty of Paying Living Expenses: Not hard at all  Food Insecurity: No Food Insecurity (07/22/2024)   Hunger Vital Sign    Worried About Running Out of Food in the Last Year: Never true    Ran Out of Food in the Last Year: Never true  Transportation Needs: No Transportation Needs (07/22/2024)   PRAPARE - Administrator, Civil Service (Medical): No    Lack of Transportation (Non-Medical): No  Physical Activity: Insufficiently Active (07/22/2024)   Exercise Vital Sign    Days of Exercise per Week: 1 day    Minutes of Exercise per Session: 10 min  Stress: No Stress Concern Present (07/22/2024)   [employer] of Occupational Health - Occupational Stress Questionnaire    Feeling of Stress: Only a  little  Social Connections: Socially Integrated (07/22/2024)   Social Connection and Isolation Panel    Frequency of Communication with Friends and Family: More than three times a week    Frequency of Social Gatherings with Friends and Family: More than three times a week    Attends Religious Services: More than 4 times per year    Active Member of [employer] or Organizations: Yes    Attends Engineer, structural: More than 4 times per year    Marital Status: Married  Catering manager Violence: Not At Risk (11/22/2023)   Humiliation, Afraid, Rape, and Kick questionnaire    Fear of Current or Ex-Partner: No    Emotionally Abused: No    Physically Abused: No    Sexually Abused: No    Outpatient Medications Prior to Visit  Medication Sig Dispense Refill   atorvastatin  (LIPITOR) 40 MG tablet Take 1 tablet (40 mg total) by mouth daily. 90 tablet 1   Calcium  Carb-Cholecalciferol  (CALTRATE 600+D3) 600-20 MG-MCG TABS Take 600 mg by mouth 2 (two) times daily. 60 tablet 3   diclofenac  Sodium (VOLTAREN ) 1 % GEL Apply 4 g topically 4 (four) times daily. 50 g 1   hydrOXYzine  (VISTARIL ) 25 MG capsule Take 1 capsule (25 mg total) by mouth at bedtime as needed. 30 capsule 0   irbesartan  (AVAPRO ) 150 MG tablet Take 1 tablet (150 mg total) by mouth daily. 90 tablet 1   levothyroxine  (SYNTHROID ) 88 MCG tablet Take 1 tablet (88 mcg total) by mouth daily before breakfast. 90 tablet 3   Semaglutide , 1 MG/DOSE, 4 MG/3ML SOPN Inject 1 mg as directed once a week. Dx code E11.65 9 mL 3   No facility-administered medications prior to visit.    Allergies  Allergen Reactions   Penicillins Rash    Has patient had a PCN reaction causing immediate rash, facial/tongue/throat swelling, SOB or lightheadedness with hypotension: Yes Has patient had a PCN reaction causing severe rash involving mucus membranes or skin necrosis: No Has patient had a PCN reaction that required hospitalization No Has patient had a PCN  reaction occurring within the last 10 years: Yes If all of the above answers are NO, then may proceed with Cephalosporin use.    Sulfa Antibiotics Rash    ROS Review of Systems  Constitutional:  Negative for chills and fever.  Eyes:  Negative for visual disturbance.  Respiratory:  Negative for chest tightness and shortness of breath.   Neurological:  Negative for dizziness and headaches.      Objective:  Physical Exam HENT:     Head: Normocephalic.     Mouth/Throat:     Mouth: Mucous membranes are moist.  Cardiovascular:     Rate and Rhythm: Normal rate.     Heart sounds: Normal heart sounds.  Pulmonary:     Effort: Pulmonary effort is normal.     Breath sounds: Normal breath sounds.  Neurological:     Mental Status: She is alert.     BP 135/78   Pulse 80   Ht 5' (1.524 m)   Wt 144 lb (65.3 kg)   LMP 04/01/2012   SpO2 97%   BMI 28.12 kg/m  Wt Readings from Last 3 Encounters:  07/25/24 144 lb (65.3 kg)  06/20/24 144 lb 12.8 oz (65.7 kg)  01/29/24 141 lb 0.2 oz (64 kg)    Lab Results  Component Value Date   TSH 0.986 06/18/2024   Lab Results  Component Value Date   WBC 5.8 01/29/2024   HGB 10.8 (L) 01/29/2024   HCT 33.4 (L) 01/29/2024   MCV 86 01/29/2024   PLT 160 01/29/2024   Lab Results  Component Value Date   NA 141 06/18/2024   K 4.1 06/18/2024   CO2 22 06/18/2024   GLUCOSE 72 06/18/2024   BUN 13 06/18/2024   CREATININE 0.94 06/18/2024   BILITOT 0.6 06/18/2024   ALKPHOS 50 06/18/2024   AST 35 06/18/2024   ALT 27 06/18/2024   PROT 6.8 06/18/2024   ALBUMIN 3.8 (L) 06/18/2024   CALCIUM  8.8 06/18/2024   ANIONGAP 12 05/13/2023   EGFR 65 06/18/2024   Lab Results  Component Value Date   CHOL 155 01/29/2024   Lab Results  Component Value Date   HDL 49 01/29/2024   Lab Results  Component Value Date   LDLCALC 91 01/29/2024   Lab Results  Component Value Date   TRIG 81 01/29/2024   Lab Results  Component Value Date   CHOLHDL  3.2 01/29/2024   Lab Results  Component Value Date   HGBA1C 5.1 06/20/2024      Assessment & Plan:  Essential (primary) hypertension Assessment & Plan: Controlled Encouraged to continue taking irbesartan  150 mg tablet daily She denies headaches, dizziness, blurred vision, chest pain, palpitation, and shortness of breath Low-sodium diet with increase physical activity encouraged BP Readings from Last 3 Encounters:  07/25/24 135/78  06/20/24 104/66  01/29/24 117/76     Other specified hypothyroidism Assessment & Plan: She takes Synthroid  88 mcg daily The patient is encouraged to follow up if experiencing symptoms of hypothyroidism, despite compliance with the current treatment regimen, such as fatigue, weight gain, constipation, dry skin, cold intolerance, hair loss, or depression.   Lab Results  Component Value Date   TSH 0.986 06/18/2024     Type 2 diabetes mellitus without complication, without long-term current use of insulin (HCC) Assessment & Plan: She denies the 3ps of diabetes She is on Ozempic  1 mg weekly  Encouraged continue following-up with her endocrinologist Recommend to continue decreasing her intake of high sugar foods and beverages with increased physical activity Lab Results  Component Value Date   HGBA1C 5.1 06/20/2024      Note: This chart has been completed using [employer] software, and while attempts have been made to ensure accuracy, certain words and phrases may not be transcribed as intended.   Follow-up: Return in about 5 months (around 12/25/2024).   Kavita Bartl, FNP

## 2024-09-24 ENCOUNTER — Telehealth: Payer: Self-pay | Admitting: Family Medicine

## 2024-09-24 DIAGNOSIS — E7849 Other hyperlipidemia: Secondary | ICD-10-CM

## 2024-09-24 DIAGNOSIS — I10 Essential (primary) hypertension: Secondary | ICD-10-CM

## 2024-09-24 NOTE — Telephone Encounter (Unsigned)
 Copied from CRM [id number]. Topic: Clinical - Medication Refill >> Sep 24, 2024  4:23 PM Zebedee SAUNDERS wrote: Medication: irbesartan  (AVAPRO ) 150 MG tablet, atorvastatin  (LIPITOR) 40 MG tablet  Has the patient contacted their pharmacy? Yes (Agent: If no, request that the patient contact the pharmacy for the refill. If patient does not wish to contact the pharmacy document the reason why and proceed with request.) (Agent: If yes, when and what did the pharmacy advise?)  This is the patient's preferred pharmacy:  [facility] - [place], [place] - [address] [place] [address] Phone: [phone] Fax: [phone]  Is this the correct pharmacy for this prescription? Yes If no, delete pharmacy and type the correct one.   Has the prescription been filled recently? Yes  Is the patient out of the medication? Yes  Has the patient been seen for an appointment in the last year OR does the patient have an upcoming appointment? Yes  Can we respond through MyChart? Yes  Agent: Please be advised that Rx refills may take up to 3 business days. We ask that you follow-up with your pharmacy.

## 2024-09-25 MED ORDER — IRBESARTAN 150 MG PO TABS: 150.0000 mg | ORAL_TABLET | Freq: Every day | ORAL | 1 refills | Status: DC

## 2024-09-25 MED ORDER — ATORVASTATIN CALCIUM 40 MG PO TABS: 40.0000 mg | ORAL_TABLET | Freq: Every day | ORAL | 1 refills | Status: DC

## 2024-11-25 ENCOUNTER — Ambulatory Visit (INDEPENDENT_AMBULATORY_CARE_PROVIDER_SITE_OTHER): Payer: Medicare Other

## 2024-11-25 VITALS — BP 134/79 | Ht 60.0 in | Wt 140.0 lb

## 2024-11-25 DIAGNOSIS — Z1231 Encounter for screening mammogram for malignant neoplasm of breast: Secondary | ICD-10-CM

## 2024-11-25 DIAGNOSIS — Z1159 Encounter for screening for other viral diseases: Secondary | ICD-10-CM

## 2024-11-25 DIAGNOSIS — Z0000 Encounter for general adult medical examination without abnormal findings: Secondary | ICD-10-CM

## 2024-11-25 DIAGNOSIS — Z780 Asymptomatic menopausal state: Secondary | ICD-10-CM

## 2024-11-25 DIAGNOSIS — E119 Type 2 diabetes mellitus without complications: Secondary | ICD-10-CM

## 2024-11-25 NOTE — Patient Instructions (Signed)
 Barbara Bennett,  Thank you for taking the time for your Medicare Wellness Visit. I appreciate your continued commitment to your health goals. Please review the care plan we discussed, and feel free to reach out if I can assist you further.  Please note that Annual Wellness Visits do not include a physical exam. Some assessments may be limited, especially if the visit was conducted virtually. If needed, we may recommend an in-person follow-up with your provider.  Ongoing Care Seeing your primary care provider every 3 to 6 months helps us  monitor your health and provide consistent, personalized care.   1 year follow up for Medicare well visit: Wednesday November 26, 2025 at 10:00 am with medicare wellness nurse in office  Referrals If a referral was made during today's visit and you haven't received any updates within two weeks, please contact the referred provider directly to check on the status.  Mammogram/Bone Density Screening: Call [facility] @ Phone: [phone]   Lab Orders         Microalbumin / creatinine urine ratio         Hepatitis C antibody    Have these labs completed at [facility] in the [place] [facility]     Recommended Screenings:  Health Maintenance  Topic Date Due   Breast Cancer Screening  Never done   Hepatitis C Screening  Never done   DTaP/Tdap/Td vaccine (1 - Tdap) Never done   Zoster (Shingles) Vaccine (1 of 2) Never done   Yearly kidney health urinalysis for diabetes  04/10/2024   Osteoporosis screening with Bone Density Scan  06/13/2024   COVID-19 Vaccine (4 - 2025-26 season) 08/05/2024   Medicare Annual Wellness Visit  11/21/2024   Yearly kidney function blood test for diabetes  06/18/2025   Colon Cancer Screening  12/30/2030   Pneumococcal Vaccine for age over 76  Completed   Flu Shot  Completed   Meningitis B Vaccine  Aged Out   Complete foot exam   Discontinued   Hemoglobin A1C  Discontinued   Eye exam for diabetics   Discontinued       11/19/2024   10:00 PM  Advanced Directives  Does Patient Have a Medical Advance Directive? No  Would patient like information on creating a medical advance directive? Yes (MAU/Ambulatory/Procedural Areas - Information given)    Vision: Annual vision screenings are recommended for early detection of glaucoma, cataracts, and diabetic retinopathy. These exams can also reveal signs of chronic conditions such as diabetes and high blood pressure.  Dental: Annual dental screenings help detect early signs of oral cancer, gum disease, and other conditions linked to overall health, including heart disease and diabetes.  Please see the attached documents for additional preventive care recommendations.

## 2024-11-25 NOTE — Progress Notes (Signed)
 "  HM Addressed: Mammogram ordered DEXA ordered Labs Due Urine acr, Hep C Screening  Chief Complaint  Patient presents with   Medicare Wellness     Subjective:   Barbara Bennett is a 68 y.o. female who presents for a Medicare Annual Wellness Visit.  Visit info / Clinical Intake: Medicare Wellness Visit Type:: Subsequent Annual Wellness Visit Persons participating in visit and providing information:: patient Medicare Wellness Visit Mode:: Telephone If telephone:: video declined Since this visit was completed virtually, some vitals may be partially provided or unavailable. Missing vitals are due to the limitations of the virtual format.: Documented vitals are patient reported If Telephone or Video please confirm:: I connected with patient using audio/video enable telemedicine. I verified patient identity with two identifiers, discussed telehealth limitations, and patient agreed to proceed. Patient Location:: family's house Provider Location:: office Interpreter Needed?: No Pre-visit prep was completed: yes AWV questionnaire completed by patient prior to visit?: yes Date:: 11/19/24 Living arrangements:: (Patient-Rptd) lives with spouse/significant other Patient's Overall Health Status Rating: (Patient-Rptd) good Typical amount of pain: (Patient-Rptd) some Does pain affect daily life?: (Patient-Rptd) no Are you currently prescribed opioids?: no  Dietary Habits and Nutritional Risks How many meals a day?: (Patient-Rptd) 3 Eats fruit and vegetables daily?: (Patient-Rptd) yes Most meals are obtained by: (Patient-Rptd) preparing own meals In the last 2 weeks, have you had any of the following?: none Diabetic:: no  Functional Status Activities of Daily Living (to include ambulation/medication): (Patient-Rptd) Independent Ambulation: (Patient-Rptd) Independent Medication Administration: (Patient-Rptd) Independent Home Management (perform basic housework or laundry): (Patient-Rptd)  Independent Manage your own finances?: (Patient-Rptd) yes Primary transportation is: (Patient-Rptd) family / friends Concerns about vision?: no *vision screening is required for WTM* Concerns about hearing?: no  Fall Screening Falls in the past year?: (Patient-Rptd) 0 Number of falls in past year: 0 Was there an injury with Fall?: 0 Fall Risk Category Calculator: 0 Patient Fall Risk Level: Low Fall Risk  Fall Risk Patient at Risk for Falls Due to: No Fall Risks Fall risk Follow up: Falls evaluation completed; Education provided; Falls prevention discussed  Home and Transportation Safety: All rugs have non-skid backing?: (Patient-Rptd) yes All stairs or steps have railings?: (Patient-Rptd) N/A, no stairs Grab bars in the bathtub or shower?: (Patient-Rptd) yes Have non-skid surface in bathtub or shower?: (Patient-Rptd) yes Good home lighting?: (Patient-Rptd) yes Regular seat belt use?: (Patient-Rptd) yes Hospital stays in the last year:: (Patient-Rptd) no  Cognitive Assessment Difficulty concentrating, remembering, or making decisions? : (Patient-Rptd) no Will 6CIT or Mini Cog be Completed: yes What year is it?: 0 points What month is it?: 0 points Give patient an address phrase to remember (5 components): [address] [place] About what time is it?: 0 points Count backwards from 20 to 1: 0 points Say the months of the year in reverse: 0 points Repeat the address phrase from earlier: 0 points 6 CIT Score: 0 points  Advance Directives (For Healthcare) Does Patient Have a Medical Advance Directive?: No Would patient like information on creating a medical advance directive?: Yes (MAU/Ambulatory/Procedural Areas - Information given)  Reviewed/Updated  Reviewed/Updated: Reviewed All (Medical, Surgical, Family, Medications, Allergies, Care Teams, Patient Goals)    Allergies (verified) Penicillins and Sulfa antibiotics   Current Medications (verified) Outpatient  Encounter Medications as of 11/25/2024  Medication Sig   atorvastatin  (LIPITOR) 40 MG tablet Take 1 tablet (40 mg total) by mouth daily.   Calcium  Carb-Cholecalciferol  (CALTRATE 600+D3) 600-20 MG-MCG TABS Take 600 mg by mouth  2 (two) times daily.   diclofenac  Sodium (VOLTAREN ) 1 % GEL Apply 4 g topically 4 (four) times daily.   hydrOXYzine  (VISTARIL ) 25 MG capsule Take 1 capsule (25 mg total) by mouth at bedtime as needed.   irbesartan  (AVAPRO ) 150 MG tablet Take 1 tablet (150 mg total) by mouth daily.   levothyroxine  (SYNTHROID ) 88 MCG tablet Take 1 tablet (88 mcg total) by mouth daily before breakfast.   Semaglutide , 1 MG/DOSE, 4 MG/3ML SOPN Inject 1 mg as directed once a week. Dx code E11.65   No facility-administered encounter medications on file as of 11/25/2024.    History: Past Medical History:  Diagnosis Date   Allergy 2012   Sulfa, penicillin   Anemia    Anemia, unspecified    Arthritis    knees   Bell's palsy    Blood transfusion without reported diagnosis 2010   Because of anemia   Cataract 2017   Removed   Hyperlipidemia, unspecified    Hypertension 2017   Hypothyroidism, unspecified    Obesity, unspecified    Type 2 diabetes mellitus without complications (HCC)    no longer on medications, previously on metformin  for borderline DM, A1C 5.6   Past Surgical History:  Procedure Laterality Date   CATARACT EXTRACTION W/PHACO Left 03/31/2016   Procedure: CATARACT EXTRACTION PHACO AND INTRAOCULAR LENS PLACEMENT (IOC);  Surgeon: Cherene Mania, MD;  Location: [facility];  Service: Ophthalmology;  Laterality: Left;  CDE: 6.45   CATARACT EXTRACTION W/PHACO Right 04/28/2016   Procedure: CATARACT EXTRACTION PHACO AND INTRAOCULAR LENS PLACEMENT (IOC);  Surgeon: Cherene Mania, MD;  Location: [facility];  Service: Ophthalmology;  Laterality: Right;  CDE 5.00   COLONOSCOPY N/A 12/30/2020   Procedure: COLONOSCOPY;  Surgeon: Shaaron Lamar HERO, MD;  Location: [facility];  Service: Endoscopy;   Laterality: N/A;  9:30am   EYE SURGERY  2017   Cataract surgery in both eyes   FRACTURE SURGERY  2013   Torn Meniscus in left knee   KNEE ARTHROSCOPY Left    Family History  Problem Relation Age of Onset   Diabetes Mother    Heart disease Mother    Obesity Mother    Heart disease Father    Diabetes Sister    Renal Disease Sister        Dialysis   Asthma Sister    Hypertension Sister    Diabetes Sister    Arthritis Sister    Diabetes Sister    Renal Disease Sister        Dialysis   Hypertension Sister    Kidney disease Sister    Stroke Sister    Arthritis Sister    Migraines Daughter    Pneumonia Son    Arthritis Sister    Kidney disease Sister    Stroke Sister    Colon cancer Neg Hx    Sickle cell anemia Neg Hx    Thalassemia Neg Hx    Leukemia Neg Hx    Social History   Occupational History   Occupation: Retired- worked at clinical biochemist  Tobacco Use   Smoking status: Never   Smokeless tobacco: Never  Vaping Use   Vaping status: Never Used  Substance and Sexual Activity   Alcohol use: Never   Drug use: Never   Sexual activity: Not Currently    Birth control/protection: Post-menopausal   Tobacco Counseling Counseling given: Yes  SDOH Screenings   Food Insecurity: No Food Insecurity (11/19/2024)  Housing: Low Risk (11/19/2024)  Transportation Needs:  No Transportation Needs (11/19/2024)  Utilities: Not At Risk (11/25/2024)  Alcohol Screen: Low Risk (11/21/2023)  Depression (PHQ2-9): Low Risk (11/25/2024)  Financial Resource Strain: Low Risk (11/19/2024)  Physical Activity: Insufficiently Active (11/19/2024)  Social Connections: Socially Integrated (11/19/2024)  Stress: No Stress Concern Present (11/19/2024)  Tobacco Use: Low Risk (11/25/2024)  Health Literacy: Adequate Health Literacy (11/25/2024)   See flowsheets for full screening details  Depression Screen PHQ 2 & 9 Depression Scale- Over the past 2 weeks, how often have you been bothered by  any of the following problems? Little interest or pleasure in doing things: 0 Feeling down, depressed, or hopeless (PHQ Adolescent also includes...irritable): 0 PHQ-2 Total Score: 0 Trouble falling or staying asleep, or sleeping too much: 0 Feeling tired or having little energy: 0 Poor appetite or overeating (PHQ Adolescent also includes...weight loss): 0 Feeling bad about yourself - or that you are a failure or have let yourself or your family down: 0 Trouble concentrating on things, such as reading the newspaper or watching television (PHQ Adolescent also includes...like school work): 0 Moving or speaking so slowly that other people could have noticed. Or the opposite - being so fidgety or restless that you have been moving around a lot more than usual: 0 Thoughts that you would be better off dead, or of hurting yourself in some way: 0 PHQ-9 Total Score: 0  Depression Treatment Depression Interventions/Treatment : EYV7-0 Score <4 Follow-up Not Indicated     Goals Addressed               This Visit's Progress     Remain active and healthy (pt-stated)               Objective:    Today's Vitals   11/25/24 1241  BP: 134/79  Weight: 140 lb (63.5 kg)  Height: 5' (1.524 m)   Body mass index is 27.34 kg/m.  Hearing/Vision screen Hearing Screening - Comments:: Patient denies any hearing difficulties.   Vision Screening - Comments:: Patient wears glasses and sees [facility] on [address] in [place]. She will call to make an appointment for her yearly exam.  Immunizations and Health Maintenance Health Maintenance  Topic Date Due   Mammogram  Never done   Hepatitis C Screening  Never done   DTaP/Tdap/Td (1 - Tdap) Never done   Zoster Vaccines- Shingrix (1 of 2) Never done   Diabetic kidney evaluation - Urine ACR  04/10/2024   Bone Density Scan  06/13/2024   COVID-19 Vaccine (4 - 2025-26 season) 08/05/2024   Medicare Annual Wellness (AWV)  11/21/2024   Diabetic  kidney evaluation - eGFR measurement  06/18/2025   Colonoscopy  12/30/2030   Pneumococcal Vaccine: 50+ Years  Completed   Influenza Vaccine  Completed   Meningococcal B Vaccine  Aged Out   FOOT EXAM  Discontinued   HEMOGLOBIN A1C  Discontinued   OPHTHALMOLOGY EXAM  Discontinued        Assessment/Plan:  This is a routine wellness examination for [place].  Patient Care Team: Bacchus, Meade PEDLAR, FNP as PCP - General (Family Medicine) Shaaron Lamar HERO, MD as Consulting Physician (Gastroenterology) Therisa Benton PARAS, NP as Nurse Practitioner (Endocrinology) [facility])  I have personally reviewed and noted the following in the patients chart:   Medical and social history Use of alcohol, tobacco or illicit drugs  Current medications and supplements including opioid prescriptions. Functional ability and status Nutritional status Physical activity Advanced directives List of other physicians Hospitalizations,  surgeries, and ER visits in previous 12 months Vitals Screenings to include cognitive, depression, and falls Referrals and appointments  Orders Placed This Encounter  Procedures   DG Bone Density    Standing Status:   Future    Expiration Date:   11/25/2025    Reason for Exam (SYMPTOM  OR DIAGNOSIS REQUIRED):   post menopausal estrogen deficient    Preferred imaging location?:   [facility]   MM 3D SCREENING MAMMOGRAM BILATERAL BREAST    Standing Status:   Future    Expiration Date:   11/25/2025    Reason for Exam (SYMPTOM  OR DIAGNOSIS REQUIRED):   breast cancer screening    Preferred imaging location?:   [facility]   Microalbumin / creatinine urine ratio   Hepatitis C antibody   In addition, I have reviewed and discussed with patient certain preventive protocols, quality metrics, and best practice recommendations. A written personalized care plan for preventive services as well as general preventive health recommendations  were provided to patient.   Koehn Salehi, CMA   11/25/2024   Return Wednesday November 26, 2025 at 10:00 am, for your yearly Medicare Wellness Visit in person.  After Visit Summary: (MyChart) Due to this being a telephonic visit, the after visit summary with patients personalized plan was offered to patient via MyChart    "

## 2024-12-23 ENCOUNTER — Ambulatory Visit (HOSPITAL_COMMUNITY)

## 2024-12-23 ENCOUNTER — Other Ambulatory Visit (HOSPITAL_COMMUNITY)

## 2024-12-25 ENCOUNTER — Encounter: Payer: Self-pay | Admitting: Nurse Practitioner

## 2024-12-25 ENCOUNTER — Ambulatory Visit: Admitting: Nurse Practitioner

## 2024-12-25 VITALS — BP 113/68 | HR 105 | Ht 60.0 in | Wt 144.0 lb

## 2024-12-25 DIAGNOSIS — E7849 Other hyperlipidemia: Secondary | ICD-10-CM | POA: Diagnosis not present

## 2024-12-25 DIAGNOSIS — E785 Hyperlipidemia, unspecified: Secondary | ICD-10-CM

## 2024-12-25 DIAGNOSIS — E1169 Type 2 diabetes mellitus with other specified complication: Secondary | ICD-10-CM

## 2024-12-25 DIAGNOSIS — D508 Other iron deficiency anemias: Secondary | ICD-10-CM

## 2024-12-25 DIAGNOSIS — E039 Hypothyroidism, unspecified: Secondary | ICD-10-CM | POA: Diagnosis not present

## 2024-12-25 DIAGNOSIS — G479 Sleep disorder, unspecified: Secondary | ICD-10-CM

## 2024-12-25 DIAGNOSIS — E119 Type 2 diabetes mellitus without complications: Secondary | ICD-10-CM | POA: Diagnosis not present

## 2024-12-25 DIAGNOSIS — M8588 Other specified disorders of bone density and structure, other site: Secondary | ICD-10-CM | POA: Diagnosis not present

## 2024-12-25 DIAGNOSIS — E669 Obesity, unspecified: Secondary | ICD-10-CM

## 2024-12-25 DIAGNOSIS — I10 Essential (primary) hypertension: Secondary | ICD-10-CM

## 2024-12-25 DIAGNOSIS — Z7985 Long-term (current) use of injectable non-insulin antidiabetic drugs: Secondary | ICD-10-CM | POA: Diagnosis not present

## 2024-12-25 DIAGNOSIS — N1831 Chronic kidney disease, stage 3a: Secondary | ICD-10-CM | POA: Diagnosis not present

## 2024-12-25 MED ORDER — IRBESARTAN 150 MG PO TABS: 150.0000 mg | ORAL_TABLET | Freq: Every day | ORAL | 1 refills | Status: AC

## 2024-12-25 MED ORDER — ATORVASTATIN CALCIUM 40 MG PO TABS: 40.0000 mg | ORAL_TABLET | Freq: Every day | ORAL | 1 refills | Status: AC

## 2024-12-25 NOTE — Progress Notes (Signed)
 "  Subjective:    Patient ID: Barbara Bennett, female    DOB: 1956-09-14, 69 y.o.   MRN: [id number]   Chief Complaint: Medical Management of Chronic Issues (Five month follow up ) and Cough (Cough with congestion, stuffy nose)    HPI:  Barbara Bennett is a 69 y.o. who identifies as a female who was assigned female at birth.   Social history: Lives with: husband Work history: retired   Water Engineer in today for follow up of the following chronic medical issues:  1. Essential (primary) hypertension No c/o chest pain, sob or headache Does check blood pressure at home- runs 110-120 systolic Is on irbesartan  daily with no issues BP Readings from Last 3 Encounters:  12/25/24 113/68  11/25/24 134/79  07/25/24 135/78     2. Type 2 diabetes mellitus without complication, without long-term current use of insulin (HCC) Has not been checking blood sugars at home Denies feeling that blood sugars are low. Is on ozempic  weekly- no side effects Lab Results  Component Value Date   HGBA1C 5.1 06/20/2024     3. Hyperlipidemia associated with type 2 diabetes mellitus (HCC) Does watch diet Does very little exercise Lipitor 40 daily Lab Results  Component Value Date   CHOL 155 01/29/2024   HDL 49 01/29/2024   LDLCALC 91 01/29/2024   TRIG 81 01/29/2024   CHOLHDL 3.2 01/29/2024     4. Acquired hypothyroidism Is on levothyroxine  88mcg daily and is doing well. No c/o fatigue Lab Results  Component Value Date   TSH 0.986 06/18/2024   FREET4 1.30 06/18/2024   THYROIDAB 14 10/14/2021     5. Stage 3a chronic kidney disease (HCC) No problems voiding Last EGFR 51   6. Sleep disturbance Wakes up frequently at night No meds for sleep  7. Other iron deficiency anemia No c/o fatigue Lab Results  Component Value Date   HGB 10.8 (L) 01/29/2024     8. Osteopenia of spine Has order for repeat dexascan in system Last tscore was -1.1  9. Obesity, unspecified class, unspecified  obesity type, unspecified whether serious comorbidity present Weight is up 4 lbs Wt Readings from Last 3 Encounters:  12/25/24 144 lb (65.3 kg)  11/25/24 140 lb (63.5 kg)  07/25/24 144 lb (65.3 kg)   BMI Readings from Last 3 Encounters:  12/25/24 28.12 kg/m  11/25/24 27.34 kg/m  07/25/24 28.12 kg/m       New complaints: None today  Allergies[1] Outpatient Encounter Medications as of 12/25/2024  Medication Sig   atorvastatin  (LIPITOR) 40 MG tablet Take 1 tablet (40 mg total) by mouth daily.   Calcium  Carb-Cholecalciferol  (CALTRATE 600+D3) 600-20 MG-MCG TABS Take 600 mg by mouth 2 (two) times daily.   diclofenac  Sodium (VOLTAREN ) 1 % GEL Apply 4 g topically 4 (four) times daily.   hydrOXYzine  (VISTARIL ) 25 MG capsule Take 1 capsule (25 mg total) by mouth at bedtime as needed.   irbesartan  (AVAPRO ) 150 MG tablet Take 1 tablet (150 mg total) by mouth daily.   levothyroxine  (SYNTHROID ) 88 MCG tablet Take 1 tablet (88 mcg total) by mouth daily before breakfast.   Semaglutide , 1 MG/DOSE, 4 MG/3ML SOPN Inject 1 mg as directed once a week. Dx code E11.65   No facility-administered encounter medications on file as of 12/25/2024.    Past Surgical History:  Procedure Laterality Date   CATARACT EXTRACTION W/PHACO Left 03/31/2016   Procedure: CATARACT EXTRACTION PHACO AND INTRAOCULAR LENS PLACEMENT (IOC);  Surgeon: Cherene Mania, MD;  Location: [facility];  Service: Ophthalmology;  Laterality: Left;  CDE: 6.45   CATARACT EXTRACTION W/PHACO Right 04/28/2016   Procedure: CATARACT EXTRACTION PHACO AND INTRAOCULAR LENS PLACEMENT (IOC);  Surgeon: Cherene Mania, MD;  Location: [facility];  Service: Ophthalmology;  Laterality: Right;  CDE 5.00   COLONOSCOPY N/A 12/30/2020   Procedure: COLONOSCOPY;  Surgeon: Shaaron Lamar HERO, MD;  [facility];  Service: Endoscopy;  Laterality: N/A;  9:30am   EYE SURGERY  2017   Cataract surgery in both eyes   FRACTURE SURGERY  2013   Torn Meniscus in left knee    KNEE ARTHROSCOPY Left     Family History  Problem Relation Age of Onset   Diabetes Mother    Heart disease Mother    Obesity Mother    Heart disease Father    Diabetes Sister    Renal Disease Sister        Dialysis   Asthma Sister    Hypertension Sister    Diabetes Sister    Arthritis Sister    Diabetes Sister    Renal Disease Sister        Dialysis   Hypertension Sister    Kidney disease Sister    Stroke Sister    Arthritis Sister    Migraines Daughter    Pneumonia Son    Arthritis Sister    Kidney disease Sister    Stroke Sister    Colon cancer Neg Hx    Sickle cell anemia Neg Hx    Thalassemia Neg Hx    Leukemia Neg Hx        Review of Systems  Constitutional:  Negative for diaphoresis.  Eyes:  Negative for pain.  Respiratory:  Negative for shortness of breath.   Cardiovascular:  Negative for chest pain, palpitations and leg swelling.  Gastrointestinal:  Negative for abdominal pain.  Endocrine: Negative for polydipsia.  Skin:  Negative for rash.  Neurological:  Negative for dizziness, weakness and headaches.  Hematological:  Does not bruise/bleed easily.  All other systems reviewed and are negative.      Objective:   Physical Exam Vitals and nursing note reviewed.  Constitutional:      General: She is not in acute distress.    Appearance: Normal appearance. She is well-developed.  HENT:     Head: Normocephalic.     Right Ear: Tympanic membrane normal.     Left Ear: Tympanic membrane normal.     Nose: Nose normal.     Mouth/Throat:     Mouth: Mucous membranes are moist.  Eyes:     Pupils: Pupils are equal, round, and reactive to light.  Neck:     Vascular: No carotid bruit or JVD.  Cardiovascular:     Rate and Rhythm: Normal rate and regular rhythm.     Heart sounds: Normal heart sounds.  Pulmonary:     Effort: Pulmonary effort is normal. No respiratory distress.     Breath sounds: Normal breath sounds. No wheezing or rales.  Chest:      Chest wall: No tenderness.  Abdominal:     General: Bowel sounds are normal. There is no distension or abdominal bruit.     Palpations: Abdomen is soft. There is no hepatomegaly, splenomegaly, mass or pulsatile mass.     Tenderness: There is no abdominal tenderness.  Musculoskeletal:        General: Normal range of motion.     Cervical back: Normal range of motion and neck supple.  Lymphadenopathy:     Cervical: No cervical adenopathy.  Skin:    General: Skin is warm and dry.  Neurological:     Mental Status: She is alert and oriented to person, place, and time.     Deep Tendon Reflexes: Reflexes are normal and symmetric.  Psychiatric:        Behavior: Behavior normal.        Thought Content: Thought content normal.        Judgment: Judgment normal.     BP 113/68   Pulse (!) 105   Ht 5' (1.524 m)   Wt 144 lb (65.3 kg)   LMP 04/01/2012   SpO2 99%   BMI 28.12 kg/m        Assessment & Plan:   Barbara Bennett comes in today with chief complaint of Medical Management of Chronic Issues (Five month follow up ) and Cough (Cough with congestion, stuffy nose)   Diagnosis and orders addressed:  1. Essential (primary) hypertension (Primary) Low salt diet - irbesartan  (AVAPRO ) 150 MG tablet; Take 1 tablet (150 mg total) by mouth daily.  Dispense: 90 tablet; Refill: 1 - CMP14+EGFR - CBC with Differential/Platelet  2. Type 2 diabetes mellitus without complication, without long-term current use of insulin (HCC) Continue to watch carbs in diet - Bayer DCA Hb A1c Waived  3. Hyperlipidemia associated with type 2 diabetes mellitus (HCC) Low fat diet - Lipid panel - atorvastatin  (LIPITOR) 40 MG tablet; Take 1 tablet (40 mg total) by mouth daily.  Dispense: 90 tablet; Refill: 1  4. Acquired hypothyroidism Continue follow up with endocrinology - Thyroid  Panel With TSH  5. Stage 3a chronic kidney disease (HCC) Labs pending  6. Sleep disturbance Bedtime routine  7. Other  iron deficiency anemia Labs pending  8. Osteopenia of spine Weight bearing exercises Should be getting call to schedule dexascan  9. Obesity, unspecified class, unspecified obesity type, unspecified whether serious comorbidity present Discussed diet and exercise for person with BMI >25 Will recheck weight in 3-6 months    Labs pending Health Maintenance reviewed Diet and exercise encouraged  Follow up plan: 6 months   Mary-Margaret Gladis, FNP     [1]  Allergies Allergen Reactions   Penicillins Rash    Has patient had a PCN reaction causing immediate rash, facial/tongue/throat swelling, SOB or lightheadedness with hypotension: Yes Has patient had a PCN reaction causing severe rash involving mucus membranes or skin necrosis: No Has patient had a PCN reaction that required hospitalization No Has patient had a PCN reaction occurring within the last 10 years: Yes If all of the above answers are NO, then may proceed with Cephalosporin use.    Sulfa Antibiotics Rash   "

## 2024-12-25 NOTE — Patient Instructions (Signed)
 Fall Prevention in the Home, Adult Falls can cause injuries and can happen to people of all ages. There are many things you can do to make your home safer and to help prevent falls. What actions can I take to prevent falls? General information Use good lighting in all rooms. Make sure to: Replace any light bulbs that burn out. Turn on the lights in dark areas and use night-lights. Keep items that you use often in easy-to-reach places. Lower the shelves around your home if needed. Move furniture so that there are clear paths around it. Do not use throw rugs or other things on the floor that can make you trip. If any of your floors are uneven, fix them. Add color or contrast paint or tape to clearly mark and help you see: Grab bars or handrails. First and last steps of staircases. Where the edge of each step is. If you use a ladder or stepladder: Make sure that it is fully opened. Do not climb a closed ladder. Make sure the sides of the ladder are locked in place. Have someone hold the ladder while you use it. Know where your pets are as you move through your home. What can I do in the bathroom?     Keep the floor dry. Clean up any water on the floor right away. Remove soap buildup in the bathtub or shower. Buildup makes bathtubs and showers slippery. Use non-skid mats or decals on the floor of the bathtub or shower. Attach bath mats securely with double-sided, non-slip rug tape. If you need to sit down in the shower, use a non-slip stool. Install grab bars by the toilet and in the bathtub and shower. Do not use towel bars as grab bars. What can I do in the bedroom? Make sure that you have a light by your bed that is easy to reach. Do not use any sheets or blankets on your bed that hang to the floor. Have a firm chair or bench with side arms that you can use for support when you get dressed. What can I do in the kitchen? Clean up any spills right away. If you need to reach something  above you, use a step stool with a grab bar. Keep electrical cords out of the way. Do not use floor polish or wax that makes floors slippery. What can I do with my stairs? Do not leave anything on the stairs. Make sure that you have a light switch at the top and the bottom of the stairs. Make sure that there are handrails on both sides of the stairs. Fix handrails that are broken or loose. Install non-slip stair treads on all your stairs if they do not have carpet. Avoid having throw rugs at the top or bottom of the stairs. Choose a carpet that does not hide the edge of the steps on the stairs. Make sure that the carpet is firmly attached to the stairs. Fix carpet that is loose or worn. What can I do on the outside of my home? Use bright outdoor lighting. Fix the edges of walkways and driveways and fix any cracks. Clear paths of anything that can make you trip, such as tools or rocks. Add color or contrast paint or tape to clearly mark and help you see anything that might make you trip as you walk through a door, such as a raised step or threshold. Trim any bushes or trees on paths to your home. Check to see if handrails are loose  or broken and that both sides of all steps have handrails. Install guardrails along the edges of any raised decks and porches. Have leaves, snow, or ice cleared regularly. Use sand, salt, or ice melter on paths if you live where there is ice and snow during the winter. Clean up any spills in your garage right away. This includes grease or oil spills. What other actions can I take? Review your medicines with your doctor. Some medicines can cause dizziness or changes in blood pressure, which increase your risk of falling. Wear shoes that: Have a low heel. Do not wear high heels. Have rubber bottoms and are closed at the toe. Feel good on your feet and fit well. Use tools that help you move around if needed. These include: Canes. Walkers. Scooters. Crutches. Ask  your doctor what else you can do to help prevent falls. This may include seeing a physical therapist to learn to do exercises to move better and get stronger. Where to find more information Centers for Disease Control and Prevention, [employer]: TonerPromos[employer]no [employer] on Aging: BaseRingTones.pl [employer] on [employer]: BaseRingTones.pl Contact a doctor if: You are afraid of falling at home. You feel weak, drowsy, or dizzy at home. You fall at home. Get help right away if you: Lose consciousness or have trouble moving after a fall. Have a fall that causes a head injury. These symptoms may be an emergency. Get help right away. Call 911. Do not wait to see if the symptoms will go away. Do not drive yourself to the hospital. This information is not intended to replace advice given to you by your health care provider. Make sure you discuss any questions you have with your health care provider. Document Revised: 07/25/2022 Document Reviewed: 07/25/2022 [employer] Patient Education  2024 [employer].

## 2024-12-26 ENCOUNTER — Ambulatory Visit: Payer: Self-pay | Admitting: Nurse Practitioner

## 2024-12-26 DIAGNOSIS — D509 Iron deficiency anemia, unspecified: Secondary | ICD-10-CM

## 2024-12-26 LAB — CMP14+EGFR
ALT: 27 IU/L (ref 0–32)
AST: 36 IU/L (ref 0–40)
Albumin: 3.8 g/dL — ABNORMAL LOW (ref 3.9–4.9)
Alkaline Phosphatase: 53 IU/L (ref 49–135)
BUN/Creatinine Ratio: 12 (ref 12–28)
BUN: 13 mg/dL (ref 8–27)
Bilirubin Total: 0.8 mg/dL (ref 0.0–1.2)
CO2: 22 mmol/L (ref 20–29)
Calcium: 8.8 mg/dL (ref 8.7–10.3)
Chloride: 102 mmol/L (ref 96–106)
Creatinine, Ser: 1.07 mg/dL — ABNORMAL HIGH (ref 0.57–1.00)
Globulin, Total: 3 g/dL (ref 1.5–4.5)
Glucose: 82 mg/dL (ref 70–99)
Potassium: 3.7 mmol/L (ref 3.5–5.2)
Sodium: 138 mmol/L (ref 134–144)
Total Protein: 6.8 g/dL (ref 6.0–8.5)
eGFR: 56 mL/min/1.73 — ABNORMAL LOW

## 2024-12-26 LAB — CBC WITH DIFFERENTIAL/PLATELET
Basophils Absolute: 0 x10E3/uL (ref 0.0–0.2)
Basos: 1 %
EOS (ABSOLUTE): 0.1 x10E3/uL (ref 0.0–0.4)
Eos: 2 %
Hematocrit: 33.5 % — ABNORMAL LOW (ref 34.0–46.6)
Hemoglobin: 10.8 g/dL — ABNORMAL LOW (ref 11.1–15.9)
Immature Grans (Abs): 0 x10E3/uL (ref 0.0–0.1)
Immature Granulocytes: 0 %
Lymphocytes Absolute: 1.2 x10E3/uL (ref 0.7–3.1)
Lymphs: 21 %
MCH: 27.6 pg (ref 26.6–33.0)
MCHC: 32.2 g/dL (ref 31.5–35.7)
MCV: 86 fL (ref 79–97)
Monocytes Absolute: 0.6 x10E3/uL (ref 0.1–0.9)
Monocytes: 11 %
Neutrophils Absolute: 3.7 x10E3/uL (ref 1.4–7.0)
Neutrophils: 65 %
Platelets: 132 x10E3/uL — ABNORMAL LOW (ref 150–450)
RBC: 3.91 x10E6/uL (ref 3.77–5.28)
RDW: 12.2 % (ref 11.7–15.4)
WBC: 5.6 x10E3/uL (ref 3.4–10.8)

## 2024-12-26 LAB — THYROID PANEL WITH TSH
Free Thyroxine Index: 2.5 (ref 1.2–4.9)
T3 Uptake Ratio: 25 % (ref 24–39)
T4, Total: 9.8 ug/dL (ref 4.5–12.0)
TSH: 2.69 u[IU]/mL (ref 0.450–4.500)

## 2024-12-26 LAB — LIPID PANEL
Chol/HDL Ratio: 2.7 ratio (ref 0.0–4.4)
Cholesterol, Total: 136 mg/dL (ref 100–199)
HDL: 50 mg/dL
LDL Chol Calc (NIH): 72 mg/dL (ref 0–99)
Triglycerides: 72 mg/dL (ref 0–149)
VLDL Cholesterol Cal: 14 mg/dL (ref 5–40)

## 2025-06-20 ENCOUNTER — Ambulatory Visit: Admitting: Nurse Practitioner

## 2025-07-02 ENCOUNTER — Ambulatory Visit: Payer: Self-pay | Admitting: Nurse Practitioner

## 2025-11-26 ENCOUNTER — Ambulatory Visit: Payer: Self-pay
